# Patient Record
Sex: Male | Born: 1965 | Race: Black or African American | Hispanic: No | Marital: Single | Smoking: Current every day smoker
Health system: Southern US, Community
[De-identification: ages and names within clinical notes are randomized; demographics above are authoritative.]

## PROBLEM LIST (undated history)

## (undated) DIAGNOSIS — Z720 Tobacco use: Secondary | ICD-10-CM

## (undated) DIAGNOSIS — I10 Essential (primary) hypertension: Secondary | ICD-10-CM

## (undated) DIAGNOSIS — A539 Syphilis, unspecified: Secondary | ICD-10-CM

## (undated) DIAGNOSIS — Z87442 Personal history of urinary calculi: Secondary | ICD-10-CM

## (undated) DIAGNOSIS — M1990 Unspecified osteoarthritis, unspecified site: Secondary | ICD-10-CM

## (undated) DIAGNOSIS — I350 Nonrheumatic aortic (valve) stenosis: Secondary | ICD-10-CM

## (undated) DIAGNOSIS — R011 Cardiac murmur, unspecified: Secondary | ICD-10-CM

## (undated) DIAGNOSIS — I639 Cerebral infarction, unspecified: Secondary | ICD-10-CM

## (undated) DIAGNOSIS — F1910 Other psychoactive substance abuse, uncomplicated: Secondary | ICD-10-CM

## (undated) DIAGNOSIS — R7309 Other abnormal glucose: Secondary | ICD-10-CM

## (undated) HISTORY — PX: CHOLECYSTECTOMY: SHX55

## (undated) HISTORY — DX: Syphilis, unspecified: A53.9

---

## 2006-07-14 ENCOUNTER — Emergency Department: Payer: Self-pay | Admitting: Unknown Physician Specialty

## 2007-03-23 ENCOUNTER — Emergency Department: Payer: Self-pay | Admitting: Emergency Medicine

## 2009-05-15 ENCOUNTER — Emergency Department: Payer: Self-pay | Admitting: Emergency Medicine

## 2009-05-21 ENCOUNTER — Inpatient Hospital Stay: Payer: Self-pay | Admitting: Surgery

## 2010-04-01 ENCOUNTER — Emergency Department: Payer: Self-pay | Admitting: Unknown Physician Specialty

## 2016-05-07 ENCOUNTER — Encounter: Payer: Self-pay | Admitting: Emergency Medicine

## 2016-05-07 ENCOUNTER — Emergency Department
Admission: EM | Admit: 2016-05-07 | Discharge: 2016-05-07 | Disposition: A | Discharge status: Home / Self Care | Payer: Self-pay | Attending: Emergency Medicine | Admitting: Emergency Medicine

## 2016-05-07 DIAGNOSIS — F17200 Nicotine dependence, unspecified, uncomplicated: Secondary | ICD-10-CM | POA: Insufficient documentation

## 2016-05-07 DIAGNOSIS — M26622 Arthralgia of left temporomandibular joint: Secondary | ICD-10-CM

## 2016-05-07 DIAGNOSIS — I10 Essential (primary) hypertension: Secondary | ICD-10-CM | POA: Insufficient documentation

## 2016-05-07 DIAGNOSIS — K029 Dental caries, unspecified: Secondary | ICD-10-CM | POA: Insufficient documentation

## 2016-05-07 DIAGNOSIS — K0889 Other specified disorders of teeth and supporting structures: Secondary | ICD-10-CM

## 2016-05-07 DIAGNOSIS — M26602 Left temporomandibular joint disorder, unspecified: Secondary | ICD-10-CM | POA: Insufficient documentation

## 2016-05-07 HISTORY — DX: Essential (primary) hypertension: I10

## 2016-05-07 LAB — POCT RAPID STREP A: Streptococcus, Group A Screen (Direct): NEGATIVE

## 2016-05-07 MED ORDER — TRAMADOL HCL 50 MG PO TABS
100.0000 mg | ORAL_TABLET | Freq: Once | ORAL | Status: AC
  Administered 2016-05-07: 100 mg via ORAL
  Filled 2016-05-07: qty 2

## 2016-05-07 MED ORDER — PENICILLIN V POTASSIUM 500 MG PO TABS
500.0000 mg | ORAL_TABLET | Freq: Four times a day (QID) | ORAL | Status: DC
  Administered 2016-05-07: 500 mg via ORAL
  Filled 2016-05-07: qty 1

## 2016-05-07 MED ORDER — TRAMADOL HCL 50 MG PO TABS: 50.0000 mg | ORAL_TABLET | Freq: Four times a day (QID) | ORAL | 0 refills | Status: DC | PRN

## 2016-05-07 MED ORDER — PENICILLIN V POTASSIUM 500 MG PO TABS: 500.0000 mg | ORAL_TABLET | Freq: Four times a day (QID) | ORAL | 0 refills | Status: DC

## 2016-05-07 NOTE — ED Triage Notes (Signed)
Patient with complaint of sore throat that started on Friday. Patient states that his left ear started hurting today. Patient also reports that he thinks he may have a dental abscess because he had some swelling a couple of days ago.

## 2016-05-07 NOTE — ED Notes (Signed)
Patient c/o sore throat, left lower dental, and left ear pain beginning Monday.

## 2016-05-07 NOTE — Discharge Instructions (Signed)
Please see medications as prescribed. Eat a soft diet. Continue with ibuprofen and tramadol as needed for pain. Follow-up with dental clinic. Return to ER for any worsening symptoms or urgent changes in her health.

## 2016-05-07 NOTE — ED Provider Notes (Signed)
[facility]-EMERGENCY DEPARTMENT Provider Note   CSN: [id number] Arrival date & time: 05/07/16  2141     History   Chief Complaint Chief Complaint  Patient presents with  . Sore Throat  . Otalgia  . Dental Pain    HPI Christopher Spencer is a 51 y.o. male resents to the emergency department for evaluation of dental pain, facial swelling, left ear pain. Patient has had a left-sided toothache, left back lower tooth #19 with left-sided ear pain and TMJ pain. He has pain with chewing. He notices some facial swelling. Symptoms been present for 3 days. No relief with ibuprofen.Marland Kitchen He denies any trauma or injury. No fevers. He is tolerating by mouth well. No nausea or vomiting. No chest pain shortness of breath or abdominal pain.  HPI  Past Medical History:  Diagnosis Date  . Hypertension     There are no active problems to display for this patient.   History reviewed. No pertinent surgical history.     Home Medications    Prior to Admission medications   Medication Sig Start Date End Date Taking? Authorizing Provider  penicillin v potassium (VEETID) 500 MG tablet Take 1 tablet (500 mg total) by mouth 4 (four) times daily. 05/07/16   Evon Slack, PA-C  traMADol (ULTRAM) 50 MG tablet Take 1 tablet (50 mg total) by mouth every 6 (six) hours as needed. 05/07/16   Evon Slack, PA-C    Family History No family history on file.  Social History Social History  Substance Use Topics  . Smoking status: Current Every Day Smoker  . Smokeless tobacco: Never Used  . Alcohol use Not on file     Allergies   Patient has no known allergies.   Review of Systems Review of Systems  Constitutional: Negative.  Negative for activity change, appetite change, chills and fever.  HENT: Positive for dental problem and facial swelling. Negative for congestion, ear pain, mouth sores, rhinorrhea, sinus pressure, sore throat and trouble swallowing.   Eyes: Negative for photophobia, pain and  discharge.  Respiratory: Negative for cough, chest tightness and shortness of breath.   Cardiovascular: Negative for chest pain and leg swelling.  Gastrointestinal: Negative for abdominal distention, abdominal pain, diarrhea, nausea and vomiting.  Genitourinary: Negative for difficulty urinating and dysuria.  Musculoskeletal: Negative for arthralgias, back pain and gait problem.  Skin: Negative for color change and rash.  Neurological: Negative for dizziness and headaches.  Hematological: Negative for adenopathy.  Psychiatric/Behavioral: Negative for agitation and behavioral problems.     Physical Exam Updated Vital Signs BP (!) 154/96   Pulse (!) 105   Temp 98.5 F (36.9 C) (Oral)   Resp 18   Ht  (1.753 m)   Wt 89.4 kg   SpO2 100%   BMI 29.09 kg/m   Physical Exam  Constitutional: He is oriented to person, place, and time. He appears well-developed and well-nourished. No distress.  HENT:  Head: Normocephalic and atraumatic.  Right Ear: External ear normal.  Left Ear: External ear normal.  Nose: Nose normal.  Mouth/Throat: Uvula is midline and oropharynx is clear and moist. No oral lesions. No trismus in the jaw. Normal dentition. Dental caries present. No dental abscesses or uvula swelling.    Patient tender along the TMJ with opening and closing the jaw. Left TM is normal. No mastoid tenderness.  Eyes: EOM are normal. Pupils are equal, round, and reactive to light.  Neck: Normal range of motion. Neck supple.  Cardiovascular: Normal  rate.  Exam reveals no gallop and no friction rub.   No murmur heard. Pulmonary/Chest: Effort normal and breath sounds normal. No respiratory distress.  Neurological: He is alert and oriented to person, place, and time.  Skin: Skin is warm and dry.  Psychiatric: He has a normal mood and affect. His behavior is normal. Thought content normal.     ED Treatments / Results  Labs (all labs ordered are listed, but only abnormal results are  displayed) Labs Reviewed  CULTURE, GROUP A STREP [facility])  POCT RAPID STREP A    EKG  EKG Interpretation None       Radiology No results found.  Procedures Procedures (including critical care time)  Medications Ordered in ED Medications  penicillin v potassium (VEETID) tablet 500 mg (not administered)  traMADol (ULTRAM) tablet 100 mg (not administered)     Initial Impression / Assessment and Plan / ED Course  I have reviewed the triage vital signs and the nursing notes.  Pertinent labs & imaging results that were available during my care of the patient were reviewed by me and considered in my medical decision making (see chart for details).     51 year old male with left-sided dental pain. His mild TMJ syndrome. He will continue with anti-inflammatory medication. Start penicillin VK, tramadol. He is educated on eating a soft diet is given information on follow-up with the dental clinic.  Final Clinical Impressions(s) / ED Diagnoses   Final diagnoses:  Pain, dental  TMJ tenderness, left    New Prescriptions New Prescriptions   PENICILLIN V POTASSIUM (VEETID) 500 MG TABLET    Take 1 tablet (500 mg total) by mouth 4 (four) times daily.   TRAMADOL (ULTRAM) 50 MG TABLET    Take 1 tablet (50 mg total) by mouth every 6 (six) hours as needed.     Evon Slack, PA-C 05/07/16 2314    Minna Antis, MD 05/07/16 [phone]

## 2016-05-08 NOTE — ED Notes (Signed)
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 Pt verbalized understanding of this and repeated it back.

## 2016-05-10 LAB — CULTURE, GROUP A STREP (THRC)

## 2016-08-24 ENCOUNTER — Ambulatory Visit (INDEPENDENT_AMBULATORY_CARE_PROVIDER_SITE_OTHER): Payer: Worker's Compensation

## 2016-08-24 ENCOUNTER — Ambulatory Visit
Admission: EM | Admit: 2016-08-24 | Discharge: 2016-08-24 | Disposition: A | Discharge status: Home / Self Care | Payer: Worker's Compensation | Attending: Family Medicine | Admitting: Family Medicine

## 2016-08-24 ENCOUNTER — Encounter: Payer: Self-pay | Admitting: Emergency Medicine

## 2016-08-24 DIAGNOSIS — S60142A Contusion of left ring finger with damage to nail, initial encounter: Secondary | ICD-10-CM

## 2016-08-24 DIAGNOSIS — W231XXA Caught, crushed, jammed, or pinched between stationary objects, initial encounter: Secondary | ICD-10-CM

## 2016-08-24 DIAGNOSIS — S6992XA Unspecified injury of left wrist, hand and finger(s), initial encounter: Secondary | ICD-10-CM

## 2016-08-24 DIAGNOSIS — S6010XA Contusion of unspecified finger with damage to nail, initial encounter: Secondary | ICD-10-CM | POA: Diagnosis not present

## 2016-08-24 MED ORDER — MELOXICAM 15 MG PO TABS: 15.0000 mg | ORAL_TABLET | Freq: Every day | ORAL | 0 refills | Status: DC

## 2016-08-24 NOTE — ED Provider Notes (Signed)
[facility]    CSN: [id number] Arrival date & time: 08/24/16  1518     History   Chief Complaint Chief Complaint  Patient presents with  . Finger Injury  . Worker's Comp. Injury    HPI Christopher Spencer is a 51 y.o. male.   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 His blood pressures also elevated but he does report he has a history of hypertension and he admits his been noncompliant not been on any medication lately. No other medical problems. He does smoke no known drug allergies no previous surgeries operations no pertinent family medical history other than hypertension relevant to today's visit   The history is provided by the patient. No language interpreter was used.  Hand Pain  This is a new problem. The problem occurs constantly. The problem has been gradually worsening. Pertinent negatives include no chest pain, no abdominal pain, no headaches and no shortness of breath. The symptoms are aggravated by exertion. Nothing relieves the symptoms. The treatment provided no relief.    Past Medical History:  Diagnosis Date  . Hypertension     There are no active problems to display for this patient.   History reviewed. No pertinent surgical history.     Home Medications    Prior to Admission medications   Medication Sig Start Date End Date Taking? Authorizing Provider  alfuzosin (UROXATRAL) 10 MG 24 hr tablet Take 10 mg by mouth daily with breakfast.   Yes [provider]  finasteride (PROSCAR) 5 MG tablet Take 5 mg by mouth daily.   Yes [provider]  hydrochlorothiazide (HYDRODIURIL) 25 MG tablet Take 25 mg by mouth daily.   Yes [provider]  meloxicam (MOBIC) 15 MG tablet Take 1 tablet (15 mg total) by mouth daily. 08/24/16   Hassan Rowan, MD    Family History History  reviewed. No pertinent family history.  Social History Social History  Substance Use Topics  . Smoking status: Current Every Day Smoker    Types: Cigarettes  . Smokeless tobacco: Never Used  . Alcohol use No     Allergies   Patient has no known allergies.   Review of Systems Review of Systems  Respiratory: Negative for shortness of breath.   Cardiovascular: Negative for chest pain.  Gastrointestinal: Negative for abdominal pain.  Neurological: Negative for headaches.  All other systems reviewed and are negative.    Physical Exam Triage Vital Signs ED Triage Vitals  Enc Vitals Group     BP 08/24/16 1632 (S) (!) 210/99     Pulse Rate 08/24/16 1632 65     Resp 08/24/16 1632 16     Temp 08/24/16 1632 98.6 F (37 C)     Temp Source 08/24/16 1632 Oral     SpO2 08/24/16 1632 100 %     Weight 08/24/16 1628 175 lb (79.4 kg)     Height 08/24/16 1628 5\' 9"  (1.753 m)     Head Circumference --      Peak Flow --      Pain Score 08/24/16 1629 8     Pain Loc --      Pain Edu? --      Excl. in GC? --    No data found.   Updated Vital Signs BP (S) (!) 210/99 (BP Location: Right Arm)  Comment: Patient states that he has not taken his BP medicines today  Pulse 65   Temp 98.6 F (37 C) (Oral)   Resp 16   Ht 5\' 9"  (1.753 m)   Wt 175 lb (79.4 kg)   SpO2 100%   BMI 25.84 kg/m   Visual Acuity Right Eye Distance:   Left Eye Distance:   Bilateral Distance:    Right Eye Near:   Left Eye Near:    Bilateral Near:     Physical Exam  Constitutional: He is oriented to person, place, and time. He appears well-developed and well-nourished.  HENT:  Head: Normocephalic and atraumatic.  Right Ear: External ear normal.  Left Ear: External ear normal.  Eyes: Pupils are equal, round, and reactive to light.  Neck: Normal range of motion. Neck supple.  Pulmonary/Chest: Effort normal.  Musculoskeletal: He exhibits edema and tenderness.       Left hand: He exhibits decreased range  of motion and tenderness.       Hands: subungal hematoma was presentover L Ring finger  Neurological: He is alert and oriented to person, place, and time.  Skin: Skin is warm and dry.  Psychiatric: He has a normal mood and affect.  Vitals reviewed.    UC Treatments / Results  Labs (all labs ordered are listed, but only abnormal results are displayed) Labs Reviewed - No data to display  EKG  EKG Interpretation None       Radiology Dg Finger Ring Left  Result Date: 08/24/2016 CLINICAL DATA:  51 y/o M; crushing injury to left ring finger today at work. EXAM: LEFT RING FINGER 2+V COMPARISON:  None. FINDINGS: There is no evidence of fracture or dislocation. There is no evidence of arthropathy or other focal bone abnormality. Soft tissues are unremarkable. IMPRESSION: Negative. Electronically Signed   By: Mitzi Hansen M.D.   On: 08/24/2016 16:49    Procedures .Marland KitchenIncision and Drainage Date/Time: 08/24/2016 5:21 PM Performed by: Hassan Rowan Authorized by: Hassan Rowan   Consent:    Consent obtained:  Verbal   Risks discussed:  Bleeding Location:    Type:  Subungual hematoma   Location:  Upper extremity   Upper extremity location:  Finger   Finger location:  L ring finger Pre-procedure details:    Skin preparation:  Betadine Procedure type:    Complexity:  Simple Procedure details:    Incision types:  Single straight (hole cauterized)   Incision depth:  Subungual   Drainage:  Bloody   Drainage amount:  Copious   Wound treatment:  Wound left open Post-procedure details:    Patient tolerance of procedure:  Tolerated well, no immediate complications Comments:     Patient fourth finger was cleaned with Betadine and then using ice skater 3 holes were placed in the nail with a copious amount of blood issue from it.    (including critical care time)  Medications Ordered in UC Medications - No data to display   Initial Impression / Assessment and Plan / UC  Course  I have reviewed the triage vital signs and the nursing notes.  Pertinent labs & imaging results that were available during my care of the patient were reviewed by me and considered in my medical decision making (see chart for details).    Will place on Mobic 15 mg 1 tablet a day ice the finger and follow-up with Ms. Hassell Halim next week restrict use of the left hand while at work until she sees we'll  place a splint on the fourth finger as well x-ray were negative for fracture   Patient stressed strongly warned he needs follow-up with a PCP very soon for elevated blood pressure and or go to the ED to have blood pressure lowered now the blood pressure he felt was elevated because the pain he was having Final Clinical Impressions(s) / UC Diagnoses   Final diagnoses:  Subungual hematoma of digit of hand, initial encounter  Injury of finger of left hand, initial encounter    New Prescriptions New Prescriptions   MELOXICAM (MOBIC) 15 MG TABLET    Take 1 tablet (15 mg total) by mouth daily.   Note: This dictation was prepared with Dragon dictation along with smaller phrase technology. Any transcriptional errors that result from this process are unintentional. Controlled Substance Prescriptions [place] Controlled Substance Registry consulted? Not Applicable   Hassan RowanWade, Missey Hasley, MD 08/24/16 [phone]

## 2016-08-24 NOTE — ED Triage Notes (Signed)
Patient states that the top of a dryer at his work fell on his left 4th finger today.

## 2017-12-03 ENCOUNTER — Emergency Department: Payer: Self-pay

## 2017-12-03 ENCOUNTER — Observation Stay
Admission: EM | Admit: 2017-12-03 | Discharge: 2017-12-05 | Disposition: A | Discharge status: Home / Self Care | Payer: Self-pay | Attending: Family Medicine | Admitting: Family Medicine

## 2017-12-03 DIAGNOSIS — I639 Cerebral infarction, unspecified: Secondary | ICD-10-CM | POA: Insufficient documentation

## 2017-12-03 DIAGNOSIS — F17210 Nicotine dependence, cigarettes, uncomplicated: Secondary | ICD-10-CM | POA: Insufficient documentation

## 2017-12-03 DIAGNOSIS — F329 Major depressive disorder, single episode, unspecified: Secondary | ICD-10-CM | POA: Insufficient documentation

## 2017-12-03 DIAGNOSIS — F1210 Cannabis abuse, uncomplicated: Secondary | ICD-10-CM | POA: Insufficient documentation

## 2017-12-03 DIAGNOSIS — F1410 Cocaine abuse, uncomplicated: Secondary | ICD-10-CM | POA: Insufficient documentation

## 2017-12-03 DIAGNOSIS — E119 Type 2 diabetes mellitus without complications: Secondary | ICD-10-CM | POA: Insufficient documentation

## 2017-12-03 DIAGNOSIS — R569 Unspecified convulsions: Principal | ICD-10-CM

## 2017-12-03 DIAGNOSIS — I10 Essential (primary) hypertension: Secondary | ICD-10-CM | POA: Insufficient documentation

## 2017-12-03 DIAGNOSIS — I739 Peripheral vascular disease, unspecified: Secondary | ICD-10-CM | POA: Insufficient documentation

## 2017-12-03 DIAGNOSIS — E785 Hyperlipidemia, unspecified: Secondary | ICD-10-CM | POA: Insufficient documentation

## 2017-12-03 MED ORDER — SODIUM CHLORIDE 0.9 % IV BOLUS
1000.0000 mL | Freq: Once | INTRAVENOUS | Status: AC
  Administered 2017-12-04: 1000 mL via INTRAVENOUS

## 2017-12-03 NOTE — ED Notes (Signed)
Patient transported to CT 

## 2017-12-03 NOTE — ED Notes (Signed)
ED Provider at bedside. 

## 2017-12-03 NOTE — ED Provider Notes (Signed)
[facility] Emergency Department Provider Note   ____________________________________________   First MD Initiated Contact with Patient 12/03/17 2342     (approximate)  I have reviewed the triage vital signs and the nursing notes.   HISTORY  Chief Complaint Seizures    HPI Christopher Spencer is a 52 y.o. male brought to the ED from home via EMS with a chief complaint of seizure.  Patient does not have a seizure disorder.  Denies regular alcohol use.  Admits to marijuana use and taking shots of alcohol earlier this evening.  Reportedly family member state patient had seizure-like activity for 5 to 6 minutes.  Patient did not bite tongue but was incontinent of urine.  Reportedly patient vomited once immediately after waking up.  Arrives to the ED alert and oriented.  Complains of mild headache.  Denies vision changes, neck pain, chest pain, shortness of breath, abdominal pain, nausea, vomiting or diarrhea.  Denies recent travel or trauma.   Past Medical History:  Diagnosis Date  . Hypertension     Patient Active Problem List   Diagnosis Date Noted  . Seizure (HCC) 12/04/2017    History reviewed. No pertinent surgical history.  Prior to Admission medications   Medication Sig Start Date End Date Taking? Authorizing Provider  meloxicam (MOBIC) 15 MG tablet Take 1 tablet (15 mg total) by mouth daily. Patient not taking: Reported on 12/04/2017 08/24/16   Hassan RowanWade, [place], MD    Allergies Patient has no known allergies.  No family history on file.  Social History Social History   Tobacco Use  . Smoking status: Current Every Day Smoker    Types: Cigarettes  . Smokeless tobacco: Never Used  Substance Use Topics  . Alcohol use: No  . Drug use: Yes    Types: Marijuana    Review of Systems  Constitutional: No fever/chills Eyes: No visual changes. ENT: No sore throat. Cardiovascular: Denies chest pain. Respiratory: Denies shortness of  breath. Gastrointestinal: No abdominal pain.  No nausea, no vomiting.  No diarrhea.  No constipation. Genitourinary: Negative for dysuria. Musculoskeletal: Negative for back pain. Skin: Negative for rash. Neurological: Positive for syncope versus seizure.  Negative for headaches, focal weakness or numbness.   ____________________________________________   PHYSICAL EXAM:  VITAL SIGNS: ED Triage Vitals  Enc Vitals Group     BP      Pulse      Resp      Temp      Temp src      SpO2      Weight      Height      Head Circumference      Peak Flow      Pain Score      Pain Loc      Pain Edu?      Excl. in GC?     Constitutional: Alert and oriented. Well appearing and in no acute distress. Eyes: Conjunctivae are normal. PERRL. EOMI. Head: Atraumatic. Nose: No congestion/rhinnorhea. Mouth/Throat: Mucous membranes are moist.  Oropharynx non-erythematous.  Did not bite tongue. Neck: No stridor.  No cervical spine tenderness to palpation. Cardiovascular: Normal rate, regular rhythm. Grossly normal heart sounds.  Good peripheral circulation. Respiratory: Normal respiratory effort.  No retractions. Lungs CTAB. Gastrointestinal: Soft and nontender. No distention. No abdominal bruits. No CVA tenderness. Musculoskeletal: No lower extremity tenderness nor edema.  No joint effusions. Neurologic: Alert and oriented x3.  CN II to XII grossly intact.  Normal speech and language.  No gross focal neurologic deficits are appreciated. MAEx4. Skin:  Skin is warm, dry and intact. No rash noted. Psychiatric: Mood and affect are normal. Speech and behavior are normal.  ____________________________________________   LABS (all labs ordered are listed, but only abnormal results are displayed)  Labs Reviewed  COMPREHENSIVE METABOLIC PANEL - Abnormal; Notable for the following components:      Result Value   Potassium 3.3 (*)    Glucose, Bld 158 (*)    All other components within normal limits   URINE DRUG SCREEN, QUALITATIVE ([facility] ONLY) - Abnormal; Notable for the following components:   Cocaine Metabolite,Ur [place] POSITIVE (*)    Cannabinoid 50 Ng, Ur [place] POSITIVE (*)    All other components within normal limits  CBC WITH DIFFERENTIAL/PLATELET  LIPASE, BLOOD  ETHANOL  TROPONIN I   ____________________________________________  EKG  ED ECG REPORT I, Simon Llamas J, the attending physician, personally viewed and interpreted this ECG.   Date: 12/04/2017  EKG Time: 2342  Rate: 78  Rhythm: normal EKG, normal sinus rhythm  Axis: Normal  Intervals:none  ST&T Change: Inverted T waves inferior laterally New T wave changes compared to 05/2009 ____________________________________________  RADIOLOGY  ED MD interpretation: Discussed with radiologist; no ICH, blurring of gray-white matter differentiation concerning for anoxic brain injury versus artifact  Official radiology report(s): Ct Head Wo Contrast  Result Date: 12/04/2017 CLINICAL DATA:  Witnessed seizure.  History of hypertension. EXAM: CT HEAD WITHOUT CONTRAST TECHNIQUE: Contiguous axial images were obtained from the base of the skull through the vertex without intravenous contrast. COMPARISON:  None. FINDINGS: BRAIN: Faint blurring of the gray-white matter differentiation. No intraparenchymal hemorrhage, mass effect nor midline shift. The ventricles and sulci are normal. No acute large vascular territory infarcts. No abnormal extra-axial fluid collections. Basal cisterns are patent. VASCULAR: Unremarkable. SKULL/SOFT TISSUES: No skull fracture. No significant soft tissue swelling. ORBITS/SINUSES: The included ocular globes and orbital contents are normal.Trace paranasal sinus mucosal thickening. Mastoid air cells are well aerated. OTHER: None. IMPRESSION: 1. Subtle findings of hypoxic ischemic injury versus artifact. 2. Acute findings discussed with and reconfirmed by Dr.Christiane Sistare on 12/04/2017 at 12:07 am. Electronically Signed    By: Awilda Metro M.D.   On: 12/04/2017 00:07    ____________________________________________   PROCEDURES  Procedure(s) performed: None  Procedures  Critical Care performed: Yes, see critical care note(s)   CRITICAL CARE Performed by: Irean Hong   Total critical care time: 30 minutes  Critical care time was exclusive of separately billable procedures and treating other patients.  Critical care was necessary to treat or prevent imminent or life-threatening deterioration.  Critical care was time spent personally by me on the following activities: development of treatment plan with patient and/or surrogate as well as nursing, discussions with consultants, evaluation of patient's response to treatment, examination of patient, obtaining history from patient or surrogate, ordering and performing treatments and interventions, ordering and review of laboratory studies, ordering and review of radiographic studies, pulse oximetry and re-evaluation of patient's condition.  ____________________________________________   INITIAL IMPRESSION / ASSESSMENT AND PLAN / ED COURSE  As part of my medical decision making, I reviewed the following data within the electronic MEDICAL RECORD NUMBER History obtained from family, Nursing notes reviewed and incorporated, Labs reviewed, EKG interpreted, Old EKG reviewed, Old chart reviewed, Radiograph reviewed, Discussed with admitting physician and Notes from prior ED visits   52 year old male who presents with first-time seizure.  Differential diagnosis includes but is not limited to ICH, encephalopathy, hypertensive  urgency, drug-induced, metabolic, infectious etiologies, etc.   Clinical Course as of Dec 04 312  Sat Dec 04, 2017  0218 Updated patient and family members of all test results.  Given patient's first-time seizure and abnormal CT head, I discussed with possible services to evaluate patient in the emergency department for admission.  In the  interim I have received patient's tox screen which includes cocaine and cannabinoids.   [JS]    Clinical Course User Index [JS] Irean Hong, MD     ____________________________________________   FINAL CLINICAL IMPRESSION(S) / ED DIAGNOSES  Final diagnoses:  Seizure (HCC)  Cocaine abuse [facility])  Marijuana abuse     ED Discharge Orders    None       Note:  This document was prepared using Dragon voice recognition software and may include unintentional dictation errors.    Irean Hong, MD 12/04/17 [phone]

## 2017-12-03 NOTE — ED Triage Notes (Signed)
Patient coming [facility] from home for witnessed seizure. Per family patient was seizing for approximately 5-6 minutes. Patient was incontinent of urine during seizure. Patient had one large emesis immediately post seizure. Patient has no history of seizures.   Patient AO X 4 at both EMS arrival and arrival to this ED. Patient reports no complaints to this RN, nor did he voice complaints to EMS.   EMS vitals: CBG 102, HR 80 bpm, 98% on RA, BP 130/80.

## 2017-12-04 ENCOUNTER — Observation Stay: Payer: Self-pay

## 2017-12-04 ENCOUNTER — Other Ambulatory Visit: Payer: Self-pay

## 2017-12-04 ENCOUNTER — Encounter: Payer: Self-pay | Admitting: Radiology

## 2017-12-04 ENCOUNTER — Observation Stay (HOSPITAL_BASED_OUTPATIENT_CLINIC_OR_DEPARTMENT_OTHER)
Admit: 2017-12-04 | Discharge: 2017-12-04 | Disposition: A | Discharge status: Home / Self Care | Payer: Self-pay | Attending: Internal Medicine | Admitting: Internal Medicine

## 2017-12-04 DIAGNOSIS — I639 Cerebral infarction, unspecified: Secondary | ICD-10-CM

## 2017-12-04 DIAGNOSIS — R569 Unspecified convulsions: Principal | ICD-10-CM

## 2017-12-04 DIAGNOSIS — I351 Nonrheumatic aortic (valve) insufficiency: Secondary | ICD-10-CM

## 2017-12-04 DIAGNOSIS — I350 Nonrheumatic aortic (valve) stenosis: Secondary | ICD-10-CM

## 2017-12-04 LAB — COMPREHENSIVE METABOLIC PANEL
ALT: 16 U/L (ref 0–44)
AST: 22 U/L (ref 15–41)
Albumin: 3.9 g/dL (ref 3.5–5.0)
Alkaline Phosphatase: 57 U/L (ref 38–126)
Anion gap: 8 (ref 5–15)
BUN: 20 mg/dL (ref 6–20)
CHLORIDE: 106 mmol/L (ref 98–111)
CO2: 28 mmol/L (ref 22–32)
CREATININE: 1.05 mg/dL (ref 0.61–1.24)
Calcium: 9.2 mg/dL (ref 8.9–10.3)
Glucose, Bld: 158 mg/dL — ABNORMAL HIGH (ref 70–99)
Potassium: 3.3 mmol/L — ABNORMAL LOW (ref 3.5–5.1)
Sodium: 142 mmol/L (ref 135–145)
TOTAL PROTEIN: 6.6 g/dL (ref 6.5–8.1)
Total Bilirubin: 0.6 mg/dL (ref 0.3–1.2)

## 2017-12-04 LAB — URINE DRUG SCREEN, QUALITATIVE (ARMC ONLY)
Amphetamines, Ur Screen: NOT DETECTED
BARBITURATES, UR SCREEN: NOT DETECTED
BENZODIAZEPINE, UR SCRN: NOT DETECTED
CANNABINOID 50 NG, UR ~~LOC~~: POSITIVE — AB
Cocaine Metabolite,Ur ~~LOC~~: POSITIVE — AB
MDMA (Ecstasy)Ur Screen: NOT DETECTED
METHADONE SCREEN, URINE: NOT DETECTED
Opiate, Ur Screen: NOT DETECTED
Phencyclidine (PCP) Ur S: NOT DETECTED
TRICYCLIC, UR SCREEN: NOT DETECTED

## 2017-12-04 LAB — CBC WITH DIFFERENTIAL/PLATELET
Abs Immature Granulocytes: 0.02 10*3/uL (ref 0.00–0.07)
BASOS ABS: 0 10*3/uL (ref 0.0–0.1)
Basophils Relative: 1 %
EOS PCT: 1 %
Eosinophils Absolute: 0.1 10*3/uL (ref 0.0–0.5)
HEMATOCRIT: 43.8 % (ref 39.0–52.0)
Hemoglobin: 14.5 g/dL (ref 13.0–17.0)
IMMATURE GRANULOCYTES: 0 %
LYMPHS ABS: 1.3 10*3/uL (ref 0.7–4.0)
LYMPHS PCT: 15 %
MCH: 31 pg (ref 26.0–34.0)
MCHC: 33.1 g/dL (ref 30.0–36.0)
MCV: 93.6 fL (ref 80.0–100.0)
MONOS PCT: 8 %
Monocytes Absolute: 0.7 10*3/uL (ref 0.1–1.0)
NEUTROS PCT: 75 %
NRBC: 0 % (ref 0.0–0.2)
Neutro Abs: 6.4 10*3/uL (ref 1.7–7.7)
Platelets: 161 10*3/uL (ref 150–400)
RBC: 4.68 MIL/uL (ref 4.22–5.81)
RDW: 13.2 % (ref 11.5–15.5)
WBC: 8.5 10*3/uL (ref 4.0–10.5)

## 2017-12-04 LAB — ECHOCARDIOGRAM COMPLETE
Height: 69 in
Weight: 2433.88 oz

## 2017-12-04 LAB — LIPASE, BLOOD: LIPASE: 28 U/L (ref 11–51)

## 2017-12-04 LAB — TROPONIN I: TROPONIN I: 0.03 ng/mL — AB (ref <0.03)

## 2017-12-04 LAB — ETHANOL

## 2017-12-04 LAB — TSH: TSH: 0.552 u[IU]/mL (ref 0.350–4.500)

## 2017-12-04 MED ORDER — IOHEXOL 350 MG/ML SOLN
75.0000 mL | Freq: Once | INTRAVENOUS | Status: AC | PRN
  Administered 2017-12-04: 75 mL via INTRAVENOUS

## 2017-12-04 MED ORDER — ACETAMINOPHEN 650 MG RE SUPP: 650.0000 mg | Freq: Four times a day (QID) | RECTAL | Status: DC | PRN

## 2017-12-04 MED ORDER — DOCUSATE SODIUM 100 MG PO CAPS
100.0000 mg | ORAL_CAPSULE | Freq: Two times a day (BID) | ORAL | Status: DC
  Administered 2017-12-04 – 2017-12-05 (×3): 100 mg via ORAL
  Filled 2017-12-04 (×3): qty 1

## 2017-12-04 MED ORDER — STROKE: EARLY STAGES OF RECOVERY BOOK
Freq: Once | Status: AC
  Administered 2017-12-04: 08:00:00

## 2017-12-04 MED ORDER — ONDANSETRON HCL 4 MG PO TABS: 4.0000 mg | ORAL_TABLET | Freq: Four times a day (QID) | ORAL | Status: DC | PRN

## 2017-12-04 MED ORDER — ACETAMINOPHEN 325 MG PO TABS
650.0000 mg | ORAL_TABLET | Freq: Four times a day (QID) | ORAL | Status: DC | PRN
  Administered 2017-12-04: 650 mg via ORAL
  Filled 2017-12-04: qty 2

## 2017-12-04 MED ORDER — ENOXAPARIN SODIUM 40 MG/0.4ML ~~LOC~~ SOLN
40.0000 mg | SUBCUTANEOUS | Status: DC
  Administered 2017-12-04: 22:00:00 40 mg via SUBCUTANEOUS
  Filled 2017-12-04: qty 0.4

## 2017-12-04 MED ORDER — ASPIRIN 325 MG PO TABS
325.0000 mg | ORAL_TABLET | Freq: Every day | ORAL | Status: DC
  Administered 2017-12-04 – 2017-12-05 (×2): 325 mg via ORAL
  Filled 2017-12-04 (×2): qty 1

## 2017-12-04 MED ORDER — ONDANSETRON HCL 4 MG/2ML IJ SOLN: 4.0000 mg | Freq: Four times a day (QID) | INTRAMUSCULAR | Status: DC | PRN

## 2017-12-04 NOTE — Progress Notes (Signed)
OT Cancellation Note  Patient Details Name: Christopher Spencer MRN: [id number] DOB: [birth year]   Cancelled Treatment:    Reason Eval/Treat Not Completed: OT screened, no needs identified, will sign off. Order received, chart reviewed. Pt back to baseline functional independence - no coordination, sensation, balance, strength, visual, or cognitive deficits noted; no functional deficits noted with ADL/mobility. No skilled OT needs identified. Will sign off. Please re-consult if additional needs arise.   Richrd PrimeJamie Stiller, MPH, MS, OTR/L ascom [phone] 12/04/17, 11:57 AM

## 2017-12-04 NOTE — ED Notes (Signed)
2 unsuccessful attempts at blood specimen collection by this RN. One [facility]. Per patient, 4 attempts at PIV insertion by EMS. Lab called to collect blood specimen.

## 2017-12-04 NOTE — Consult Note (Signed)
Neurology Consult note    HPI: This is a 52 year old man with a past medical history of hypertension, polysubstance abuse who was admitted to [facility] on November 15 after a witnessed seizure.  Per family patient was seizing for approximately 5 to 6 minutes, he was incontinent of urine, bit his tongue, vomited post seizure.  Initially he denied regular alcohol use but admitted to marijuana and taking shots of alcohol earlier that evening, he is also a current smoker.  CT of the head possibly showed hypoxic ischemic injury versus artifact, likely artifact due to patient's nonfocal neurologic status.  MRI of the brain was ordered.  Urine drug screen was positive for cocaine and marijuana.   OBJECTIVE Vitals:   12/04/17 0230 12/04/17 0239 12/04/17 0337 12/04/17 1340  BP:  140/81 136/81 137/83  Pulse: 78 70 79 80  Resp: 18 (!) 21  16  Temp:   98.1 F (36.7 C) 98.5 F (36.9 C)  TempSrc:   Oral Oral  SpO2: 100% 99% 98% 99%  Weight:   69 kg   Height:   5\' 9"  (1.753 m)     CBC:  Recent Labs  Lab 12/04/17 0021  WBC 8.5  NEUTROABS 6.4  HGB 14.5  HCT 43.8  MCV 93.6  PLT 161    Basic Metabolic Panel:  Recent Labs  Lab 12/04/17 0021  NA 142  K 3.3*  CL 106  CO2 28  GLUCOSE 158*  BUN 20  CREATININE 1.05  CALCIUM 9.2    Lipid Panel: No results found for: CHOL, TRIG, HDL, CHOLHDL, VLDL, LDLCALC HgbA1c: No results found for: HGBA1C Urine Drug Screen:     Component Value Date/Time   LABOPIA NONE DETECTED 12/04/2017 0148   COCAINSCRNUR POSITIVE (A) 12/04/2017 0148   LABBENZ NONE DETECTED 12/04/2017 0148   AMPHETMU NONE DETECTED 12/04/2017 0148   THCU POSITIVE (A) 12/04/2017 0148   LABBARB NONE DETECTED 12/04/2017 0148    Alcohol Level     Component Value Date/Time   ETH <10 12/04/2017 0021    IMAGING  Results for orders placed or performed during the hospital encounter of 12/03/17  MR BRAIN WO CONTRAST   Narrative   CLINICAL DATA:  New onset seizure.   Ataxia.  EXAM: MRI HEAD WITHOUT CONTRAST  TECHNIQUE: Multiplanar, multiecho pulse sequences of the brain and surrounding structures were obtained without intravenous contrast.  COMPARISON:  Head CT 12/03/2017  FINDINGS: Brain: There are 2 punctate foci of mild diffusion weighted and T2 FLAIR hyperintensity, one in each cerebellar hemisphere, without clearly reduced ADC though small size limits evaluation. No acute supratentorial infarct is identified. Subcentimeter focus of susceptibility artifact along the inferomedial aspect of the right occipital lobe corresponds to tentorial calcification on CT. No intracranial hemorrhage, mass, midline shift, or extra-axial fluid collection is identified. Patchy T2 hyperintensities in the cerebral white matter bilaterally are nonspecific but compatible with chronic small vessel ischemic disease, moderately advanced for age. The ventricles and sulci are normal. Dedicated seizure protocol temporal lobe imaging was not performed.  Vascular: Major intracranial vascular flow voids are preserved.  Skull and upper cervical spine: Unremarkable bone marrow signal.  Sinuses/Orbits: Unremarkable orbits. Complete opacification of the left maxillary sinus. Mucous retention cysts or polyps in the right maxillary sinus. Mild mucosal thickening in the other sinuses. Trace right mastoid fluid.  Other: None.  IMPRESSION: 1. 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 2. Moderately age advanced chronic small vessel ischemia in the cerebral white matter.   Electronically  Signed   By: Sebastian Ache M.D.   On: 12/04/2017 11:24   CT Head Wo Contrast   Narrative   CLINICAL DATA:  Witnessed seizure.  History of hypertension.  EXAM: CT HEAD WITHOUT CONTRAST  TECHNIQUE: Contiguous axial images were obtained from the base of the skull through the vertex without intravenous contrast.  COMPARISON:  None.  FINDINGS: BRAIN: Faint  blurring of the gray-white matter differentiation. No intraparenchymal hemorrhage, mass effect nor midline shift. The ventricles and sulci are normal. No acute large vascular territory infarcts. No abnormal extra-axial fluid collections. Basal cisterns are patent.  VASCULAR: Unremarkable.  SKULL/SOFT TISSUES: No skull fracture. No significant soft tissue swelling.  ORBITS/SINUSES: The included ocular globes and orbital contents are normal.Trace paranasal sinus mucosal thickening. Mastoid air cells are well aerated.  OTHER: None.  IMPRESSION: 1. Subtle findings of hypoxic ischemic injury versus artifact. 2. Acute findings discussed with and reconfirmed by Dr.JADE SUNG on 12/04/2017 at 12:07 am.   Electronically Signed   By: Awilda Metro M.D.   On: 12/04/2017 00:07        PHYSICAL EXAM Physical exam: Exam: Gen: NAD, conversant and teary                     CV: RRR, no MRG. No Carotid Bruits. No peripheral edema, warm, nontender Eyes: Conjunctivae clear without exudates or hemorrhage  Neuro: Detailed Neurologic Exam  Speech:    Speech is normal; fluent and spontaneous with normal comprehension.  Cognition:    The patient is oriented to person, place, and time;     recent and remote memory intact;     language fluent;     normal attention, concentration,     fund of knowledge Cranial Nerves:    The pupils are equal, round, and reactive to light.  Attempted for endoscopy could not visualize. Visual fields are full to finger confrontation. Extraocular movements are intact. Trigeminal sensation is intact and the muscles of mastication are normal. The face is symmetric. The palate elevates in the midline. Hearing intact. Voice is normal. Shoulder shrug is normal. The tongue has normal motion without fasciculations.   Coordination:    Normal finger to nose  Gait:    Ambulating independently  Motor Observation:    No asymmetry, no atrophy, and no involuntary  movements noted. Tone:    Normal muscle tone.    Posture:    Posture is normal. normal erect    Strength:    Strength is V/V in the upper and lower limbs.      Sensation: intact to LT     Reflex Exam:  DTR's:    Deep tendon reflexes in the upper and lower extremities are symmetrical bilaterally.   Toes:    The toes are downgoing bilaterally.   Clonus:    Clonus is absent.    ASSESSMENT/PLAN [name] is a 52 y.o. male with history of hypertension, polysubstance abuse presenting with seizure in the setting of alcohol, marijuana and cocaine use.  In the process of evaluation MRI showed 2 acute or subacute punctate cerebellar infarcts.  Patient does not have a seizure disorder.  Suspect seizure was provoked by polysubstance abuse.  Patient needs a stroke evaluation at this point.  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  MRA head: Pending  Carotid  Doppler: Pending  2D Echo:  No cardiac source of emboli was indentified. Saline contrast  bubble study not performed. Pending results of CTA head and neck, may need need TEE.  LDL: Pending  HgbA1c: Pending  No anticoagulants prior to admission, now on ASA 325  Patient counseled to be compliant with his antithrombotic medications  Ongoing aggressive stroke risk factor management  Therapy recommendations: Likely none  Disposition: Likely home  Hypertension .  Permissive hypertension (OK if < 220/120) but gradually normalize in 5-7 days .  Long-term BP goal normotensive  Hyperlipidemia  LDL pending, goal < 70  statin at discharge?  Diabetes type II  HgbA1c pending goal < 7.  Other Stroke Risk Factors  Polysubstance abuse including cocaine  Cigarette smoker advised to stop smoking  ETOH use, advised to drink no  more than 2 drink(s) a day  Advanced small vessel disease for age  Personally examined patient and images, and have participated in and made any corrections needed to history, physical, neuro exam,assessment and plan as stated above.  I have personally obtained the history, evaluated lab date, reviewed imaging studies and agree with radiology interpretations.    Naomie DeanAntonia Syan Cullimore, MD  To contact Stroke Continuity provider, please refer to [email or web address]. After hours, contact General Neurology

## 2017-12-04 NOTE — ED Notes (Signed)
Lab at bedside

## 2017-12-04 NOTE — ED Notes (Signed)
Patient admits to marijuana use today. Patient reports drinking 1 shot of liquor.

## 2017-12-04 NOTE — Progress Notes (Addendum)
Pt said this was his first seizure, he grieved about the events leading to his seizure. He expressed fear of uncertainty. He said his condition evoked a deeper reflection on his life.  He said he has a good support system.    12/04/17 1900  Clinical Encounter Type  Visited With Patient  Visit Type Follow-up  Referral From Patient;Nurse  Advance Directives (For Healthcare)  Does Patient Have a Medical Advance Directive? No

## 2017-12-04 NOTE — Progress Notes (Signed)
SLP Cancellation Note  Patient Details Name: Christopher Spencer MRN: [id number] DOB: 07/25/1965   Cancelled treatment:       Reason Eval/Treat Not Completed: SLP screened, no needs identified, will sign off(chart reviewed; consulted NSG then met w/ pt in room). Pt denied any difficulty swallowing and is currently on a regular diet; tolerates swallowing pills w/ water per NSG. Pt conversed at conversational level w/out deficits noted; pt denied any speech-language deficits.  No further skilled ST services indicated as pt appears at his baseline. Pt agreed. NSG to reconsult if any change in status while admitted.    Orinda Kenner, MS, CCC-SLP Leiloni Smithers 12/04/2017, 10:15 AM

## 2017-12-04 NOTE — H&P (Signed)
Christopher Spencer is an 52 y.o. male.   Chief Complaint: Seizure HPI: Patient with past medical history of hypertension presents to the emergency department after witnessed seizure lasting 5 to 6 minutes..  The patient has never had seizures before.  CT of his head showed faint blurring of gray-white matter initiation concerning for possible ischemic injury versus artifact.  The patient complained of mild headache in the emergency department but otherwise was slightly postictal.  He denies pain except for his tongue that was clearly bitten during seizure.  Due to new onset seizure activity and possible stroke the emergency department staff called the hospitalist service for admission.  Past Medical History:  Diagnosis Date  . Hypertension     History reviewed. No pertinent surgical history. None  History reviewed. No pertinent family history. HTN in distant relatives  Social History:  reports that he has been smoking cigarettes. He has been smoking about 0.50 packs per day. He has never used smokeless tobacco. He reports that he has current or past drug history. Drug: Marijuana. He reports that he does not drink alcohol.  Allergies: No Known Allergies  Medications Prior to Admission  Medication Sig Dispense Refill  . meloxicam (MOBIC) 15 MG tablet Take 1 tablet (15 mg total) by mouth daily. (Patient not taking: Reported on 12/04/2017) 30 tablet 0    Results for orders placed or performed during the hospital encounter of 12/03/17 (from the past 48 hour(s))  CBC with Differential     Status: None   Collection Time: 12/04/17 12:21 AM  Result Value Ref Range   WBC 8.5 4.0 - 10.5 K/uL   RBC 4.68 4.22 - 5.81 MIL/uL   Hemoglobin 14.5 13.0 - 17.0 g/dL   HCT 43.8 39.0 - 52.0 %   MCV 93.6 80.0 - 100.0 fL   MCH 31.0 26.0 - 34.0 pg   MCHC 33.1 30.0 - 36.0 g/dL   RDW 13.2 11.5 - 15.5 %   Platelets 161 150 - 400 K/uL   nRBC 0.0 0.0 - 0.2 %   Neutrophils Relative % 75 %   Neutro Abs 6.4 1.7 - 7.7  K/uL   Lymphocytes Relative 15 %   Lymphs Abs 1.3 0.7 - 4.0 K/uL   Monocytes Relative 8 %   Monocytes Absolute 0.7 0.1 - 1.0 K/uL   Eosinophils Relative 1 %   Eosinophils Absolute 0.1 0.0 - 0.5 K/uL   Basophils Relative 1 %   Basophils Absolute 0.0 0.0 - 0.1 K/uL   Immature Granulocytes 0 %   Abs Immature Granulocytes 0.02 0.00 - 0.07 K/uL    Comment: Performed at [facility], [address] [place], [place] [address]  Comprehensive metabolic panel     Status: Abnormal   Collection Time: 12/04/17 12:21 AM  Result Value Ref Range   Sodium 142 135 - 145 mmol/L   Potassium 3.3 (L) 3.5 - 5.1 mmol/L   Chloride 106 98 - 111 mmol/L   CO2 28 22 - 32 mmol/L   Glucose, Bld 158 (H) 70 - 99 mg/dL   BUN 20 6 - 20 mg/dL   Creatinine, Ser 1.05 0.61 - 1.24 mg/dL   Calcium 9.2 8.9 - 10.3 mg/dL   Total Protein 6.6 6.5 - 8.1 g/dL   Albumin 3.9 3.5 - 5.0 g/dL   AST 22 15 - 41 U/L   ALT 16 0 - 44 U/L   Alkaline Phosphatase 57 38 - 126 U/L   Total Bilirubin 0.6 0.3 - 1.2  mg/dL   GFR calc non Af Amer >60 >60 mL/min   GFR calc Af Amer >60 >60 mL/min    Comment: (NOTE) The eGFR has been calculated using the CKD EPI equation. This calculation has not been validated in all clinical situations. eGFR's persistently <60 mL/min signify possible Chronic Kidney Disease.    Anion gap 8 5 - 15    Comment: Performed at [facility], [address] [place], [place] [address]  Lipase, blood     Status: None   Collection Time: 12/04/17 12:21 AM  Result Value Ref Range   Lipase 28 11 - 51 U/L    Comment: Performed at [facility], [address] [place], [place] [address]  Ethanol     Status: None   Collection Time: 12/04/17 12:21 AM  Result Value Ref Range   Alcohol, Ethyl (B) <10 <10 mg/dL    Comment: (NOTE) Lowest detectable limit for serum alcohol is 10 mg/dL. For medical purposes only. Performed at [facility], [address] [place], [place] [address]    Troponin I - Add-On to previous collection     Status: Abnormal   Collection Time: 12/04/17 12:21 AM  Result Value Ref Range   Troponin I 0.03 (HH) <0.03 ng/mL    Comment: CRITICAL RESULT CALLED TO, READ BACK BY AND VERIFIED WITH YAKANA CROSS ON 12/04/17 AT 1017 [facility] Performed at [facility], [address], [place], [place] [address]   Urine Drug Screen, Qualitative     Status: Abnormal   Collection Time: 12/04/17  1:48 AM  Result Value Ref Range   Tricyclic, Ur Screen NONE DETECTED NONE DETECTED   Amphetamines, Ur Screen NONE DETECTED NONE DETECTED   MDMA (Ecstasy)Ur Screen NONE DETECTED NONE DETECTED   Cocaine Metabolite,Ur [place] POSITIVE (A) NONE DETECTED   Opiate, Ur Screen NONE DETECTED NONE DETECTED   Phencyclidine (PCP) Ur S NONE DETECTED NONE DETECTED   Cannabinoid 50 Ng, Ur [place] POSITIVE (A) NONE DETECTED   Barbiturates, Ur Screen NONE DETECTED NONE DETECTED   Benzodiazepine, Ur Scrn NONE DETECTED NONE DETECTED   Methadone Scn, Ur NONE DETECTED NONE DETECTED    Comment: (NOTE) Tricyclics + metabolites, urine    Cutoff 1000 ng/mL Amphetamines + metabolites, urine  Cutoff 1000 ng/mL MDMA (Ecstasy), urine              Cutoff 500 ng/mL Cocaine Metabolite, urine          Cutoff 300 ng/mL Opiate + metabolites, urine        Cutoff 300 ng/mL Phencyclidine (PCP), urine         Cutoff 25 ng/mL Cannabinoid, urine                 Cutoff 50 ng/mL Barbiturates + metabolites, urine  Cutoff 200 ng/mL Benzodiazepine, urine              Cutoff 200 ng/mL Methadone, urine                   Cutoff 300 ng/mL The urine drug screen provides only a preliminary, unconfirmed analytical test result and should not be used for non-medical purposes. Clinical consideration and professional judgment should be applied to any positive drug screen result due to possible interfering substances. A more specific alternate chemical method must be used in order to obtain a confirmed analytical  result. Gas chromatography / mass spectrometry (GC/MS) is the preferred confirmat ory method. Performed at [facility], [place]  [address], [place], [place] [address]   TSH     Status: None   Collection Time: 12/04/17  1:48 AM  Result Value Ref Range   TSH 0.552 0.350 - 4.500 uIU/mL    Comment: Performed by a 3rd Generation assay with a functional sensitivity of <=0.01 uIU/mL. Performed at [facility], [address] [place], [place] [address]    Ct Head Wo Contrast  Result Date: 12/04/2017 CLINICAL DATA:  Witnessed seizure.  History of hypertension. EXAM: CT HEAD WITHOUT CONTRAST TECHNIQUE: Contiguous axial images were obtained from the base of the skull through the vertex without intravenous contrast. COMPARISON:  None. FINDINGS: BRAIN: Faint blurring of the gray-white matter differentiation. No intraparenchymal hemorrhage, mass effect nor midline shift. The ventricles and sulci are normal. No acute large vascular territory infarcts. No abnormal extra-axial fluid collections. Basal cisterns are patent. VASCULAR: Unremarkable. SKULL/SOFT TISSUES: No skull fracture. No significant soft tissue swelling. ORBITS/SINUSES: The included ocular globes and orbital contents are normal.Trace paranasal sinus mucosal thickening. Mastoid air cells are well aerated. OTHER: None. IMPRESSION: 1. Subtle findings of hypoxic ischemic injury versus artifact. 2. Acute findings discussed with and reconfirmed by Dr.JADE SUNG on 12/04/2017 at 12:07 am. Electronically Signed   By: Elon Alas M.D.   On: 12/04/2017 00:07    Review of Systems  Constitutional: Negative for chills and fever.  HENT: Negative for sore throat and tinnitus.   Eyes: Negative for blurred vision and redness.  Respiratory: Negative for cough and shortness of breath.   Cardiovascular: Negative for chest pain, palpitations, orthopnea and PND.  Gastrointestinal: Negative for abdominal pain, diarrhea, nausea and vomiting.   Genitourinary: Negative for dysuria, frequency and urgency.  Musculoskeletal: Negative for joint pain and myalgias.  Skin: Negative for rash.       No lesions  Neurological: Positive for seizures. Negative for speech change, focal weakness and weakness.  Endo/Heme/Allergies: Does not bruise/bleed easily.       No temperature intolerance  Psychiatric/Behavioral: Negative for depression and suicidal ideas.    Blood pressure 136/81, pulse 79, temperature 98.1 F (36.7 C), temperature source Oral, resp. rate (!) 21, height '5\' 9"'  (1.753 m), weight 69 kg, SpO2 98 %. Physical Exam  Vitals reviewed. Constitutional: He is oriented to person, place, and time. He appears well-developed and well-nourished. No distress.  HENT:  Head: Normocephalic and atraumatic.  Mouth/Throat: Oropharynx is clear and moist.  Eyes: Pupils are equal, round, and reactive to light. Conjunctivae and EOM are normal. No scleral icterus.  Neck: Normal range of motion. Neck supple. No JVD present. No tracheal deviation present. No thyromegaly present.  Cardiovascular: Normal rate, regular rhythm and normal heart sounds. Exam reveals no gallop and no friction rub.  No murmur heard. Respiratory: No respiratory distress.  GI: Soft. Bowel sounds are normal. He exhibits no distension. There is no tenderness.  Genitourinary:  Genitourinary Comments: Deferred  Musculoskeletal: Normal range of motion. He exhibits no edema.  Lymphadenopathy:    He has no cervical adenopathy.  Neurological: He is alert and oriented to person, place, and time. No cranial nerve deficit.  Skin: Skin is warm and dry. No rash noted. No erythema.  Psychiatric: He has a normal mood and affect. His behavior is normal. Judgment and thought content normal.     Assessment/Plan This is a 52 year old male admitted for new onset seizures. 1.  Seizures: Seizure precautions; stroke work-up.  No neurological deficits at this time.  Consult neurology. 2.   Hypertension: Controlled; continue  to monitor 3.  Depression: Stable; continue Zoloft 4.  DVT prophylaxis: Lovenox 5.  GI prophylaxis: None The patient is a full code.  Time spent on admission orders and patient care approximately 45 minutes  Harrie Foreman, MD 12/04/2017, 7:56 AM

## 2017-12-04 NOTE — Progress Notes (Signed)
PT Cancellation Note  Patient Details Name: Christopher AlbinoRandy E Gravelle MRN: [id number] DOB: 29-Dec-1965   Cancelled Treatment:    Reason Eval/Treat Not Completed: PT screened, no needs identified, will sign off(Chart reviewed, RN consulted. Pt denies acute abnormality. No gross motor coordiantion or finen motor coorniation deficits identified in screening. Will sign off. )   11:56 AM, 12/04/17 Rosamaria LintsAllan C Levada Bowersox, PT, DPT Physical Therapist - [facility]  [phone] (ASCOM)     Rosaland Shiffman C 12/04/2017, 11:56 AM

## 2017-12-04 NOTE — Progress Notes (Signed)
CRITICAL VALUE ALERT  Critical Value:  0.03 troponin  Date & Time Notied:  12/04/2017 0343  Provider Notified: Joycelyn RuaMichael Diamond, MD  Orders Received/Actions taken: No new orders at this time. Continue to monitor.

## 2017-12-04 NOTE — ED Notes (Signed)
ED Provider at bedside. 

## 2017-12-05 LAB — LIPID PANEL
Cholesterol: 122 mg/dL (ref 0–200)
HDL: 60 mg/dL (ref >40)
LDL Cholesterol: 52 mg/dL (ref 0–99)
Total CHOL/HDL Ratio: 2 RATIO
Triglycerides: 51 mg/dL (ref <150)
VLDL: 10 mg/dL (ref 0–40)

## 2017-12-05 MED ORDER — ASPIRIN EC 81 MG PO TBEC: 81.0000 mg | DELAYED_RELEASE_TABLET | Freq: Every day | ORAL | 0 refills | Status: DC

## 2017-12-05 MED ORDER — ATORVASTATIN CALCIUM 20 MG PO TABS: 80.0000 mg | ORAL_TABLET | Freq: Every day | ORAL | Status: DC

## 2017-12-05 NOTE — Progress Notes (Signed)
Initial Nutrition Assessment  DOCUMENTATION CODES:   Not applicable  INTERVENTION:  Provided [place] to patient. Encouraged him to utilize food resources available in the county so he can have more regular intake of nutritionally-complete meals.  NUTRITION DIAGNOSIS:   Inadequate oral intake related to social / environmental circumstances(food insecurity, homelessness) as evidenced by per patient/family report.  GOAL:   Patient will meet greater than or equal to 90% of their needs  MONITOR:   PO intake, Labs, Weight trends, I & O's  REASON FOR ASSESSMENT:   Malnutrition Screening Tool   ASSESSMENT:   52 year old male with PMHx of HTN, polysubstance abuse who is admitted with seizure in the setting of alcohol, marijuana, and cocaine use, also found to have 2 acute/subacute punctuate cerebellar infarcts.   Met with patient at bedside. He reports his appetite is very good and was also good PTA. He reports he lost his job a few months ago and was unable to afford food. He has been eating infrequently and irregularly and usually only able to afford "junk food." He also reports he has been homeless recently. He is hopefully starting a new job soon and will also be moving in with his aunt. He reports once that happens he will be able to eat regular meals again. He is eating very well here and finishing 100% of meals. He reports he does not need any ONS or snacks. He is amenable to receiving community resource assistance guide that contains information on food resources available in [employer].   Patient reports UBW was 180 lbs (81.8 kg) and that he has lost his weight over the past few months. Large gap in weight trend in chart so unable to trend exactly when patient lost his weight. He was 89.4 kg on 05/07/2016 and 79.4 kg on 08/24/2016.   Meal Completion: 100%  Medications reviewed and include: aspirin 325 mg daily, Colace 100 mg  BID.  Labs reviewed: Potassium 3.3.  Patient does not meet criteria for malnutrition but is at risk for malnutrition if food insecurity persists. Provided patient with Health and safety inspector. He also reports he is moving in with a family member and starting a new job soon.  NUTRITION - FOCUSED PHYSICAL EXAM:    Most Recent Value  Orbital Region  No depletion  Upper Arm Region  No depletion  Thoracic and Lumbar Region  No depletion  Buccal Region  No depletion  Temple Region  Mild depletion  Clavicle Bone Region  Mild depletion  Clavicle and Acromion Bone Region  Mild depletion  Scapular Bone Region  No depletion  Dorsal Hand  No depletion  Patellar Region  No depletion  Anterior Thigh Region  No depletion  Posterior Calf Region  No depletion  Edema (RD Assessment)  None  Hair  Reviewed  Eyes  Reviewed  Mouth  Reviewed  Skin  Reviewed  Nails  Reviewed     Diet Order:   Diet Order            Diet - low sodium heart healthy        Diet Heart Room service appropriate? Yes; Fluid consistency: Thin  Diet effective now             EDUCATION NEEDS:   Education needs have been addressed  Skin:  Skin Assessment: Reviewed RN Assessment  Last BM:  12/04/2017  Height:   Ht Readings from Last 1 Encounters:  12/04/17 _0  (1.753 m)  Weight:   Wt Readings from Last 1 Encounters:  12/05/17 73.6 kg   Ideal Body Weight:  72.7 kg  BMI:  Body mass index is 23.96 kg/m.  Estimated Nutritional Needs:   Kcal:  4734-0370 (MSJ x 1.2-1.4)  Protein:  90-100 grams (1.2-1.4 grams/kg)  Fluid:  1.9-2.2 L/day (1 mL/kcal)  Willey Blade, MS, RD, LDN Office: [phone] Pager: [phone] After Hours/Weekend Pager: [phone]

## 2017-12-05 NOTE — Discharge Summary (Signed)
[facility] [place] at [facility]   PATIENT NAME: Joziyah Roblero    MR#:  [id number]  DATE OF BIRTH:  05-Mar-1965  DATE OF ADMISSION:  12/03/2017 ADMITTING PHYSICIAN: Arnaldo Natal, MD  DATE OF DISCHARGE: No discharge date for patient encounter.  PRIMARY CARE PHYSICIAN: Patient, No Pcp Per    ADMISSION DIAGNOSIS:  Marijuana abuse [F12.10] Seizure (HCC) [R56.9] Cocaine abuse (HCC) [F14.10]  DISCHARGE DIAGNOSIS:  Active Problems:   Seizure (HCC)   SECONDARY DIAGNOSIS:   Past Medical History:  Diagnosis Date  . 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 Cessation counseling given while in house as well as printed materials given to patient  *Acute ischemic cerebrovascular accident  Compounded by poly-illicit drug abuse Admitted on our stroke protocol, imaging noted for 2 punctate cerebellar infarcts, patient treated with aspirin, LDL at goal, carotid Dopplers/echocardiogram/CT angios of the head neck were all normal studies  No focal deficits  *Chronic hypertension  Stable   *Chronic depression  Stable on current regiment    DISCHARGE CONDITIONS:   stable  CONSULTS OBTAINED:    DRUG ALLERGIES:  No Known Allergies  DISCHARGE MEDICATIONS:   Allergies as of 12/05/2017   No Known Allergies     Medication List    TAKE these medications   aspirin EC 81 MG tablet Take 1 tablet (81 mg total) by mouth daily.   meloxicam 15 MG tablet Commonly known as:  MOBIC Take 1 tablet (15 mg total) by mouth daily.        DISCHARGE INSTRUCTIONS:      If you experience worsening of your admission symptoms, develop  shortness of breath, life threatening emergency, suicidal or homicidal thoughts you must seek medical attention immediately by calling 911 or calling your MD immediately  if symptoms less severe.  You Must read complete instructions/literature along with all the possible adverse reactions/side effects for all the Medicines you take and that have been prescribed to you. Take any new Medicines after you have completely understood and accept all the possible adverse reactions/side effects.   Please note  You were cared for by a hospitalist during your hospital stay. If you have any questions about your discharge medications or the care you received while you were in the hospital after you are discharged, you can call the unit and asked to speak with the hospitalist on call if the hospitalist that took care of you is not available. Once you are discharged, your primary care physician will handle any further medical issues. Please note that NO REFILLS for any discharge medications will be authorized once you are discharged, as it is imperative that you return to your primary care physician (or establish a relationship with a primary care physician if you do not have one) for your aftercare needs so that they can reassess your need for medications and monitor your lab values.    Today   CHIEF COMPLAINT:   Chief Complaint  Patient presents with  . Seizures    HISTORY OF PRESENT ILLNESS:   Patient with past medical history of hypertension presents to the emergency department after witnessed seizure lasting 5 to 6 minutes..  The patient has  never had seizures before.  CT of his head showed faint blurring of gray-white matter initiation concerning for possible ischemic injury versus artifact.  The patient complained of mild headache in the emergency department but otherwise was slightly postictal.  He denies pain except for his tongue that was clearly bitten during seizure.  Due to new onset seizure  activity and possible stroke the emergency department staff called the hospitalist service for admission.  VITAL SIGNS:  Blood pressure (!) 177/98, pulse 69, temperature 97.6 F (36.4 C), temperature source Oral, resp. rate 18, height 5\' 9"  (1.753 m), weight 73.6 kg, SpO2 100 %.  I/O:    Intake/Output Summary (Last 24 hours) at 12/05/2017 1034 Last data filed at 12/05/2017 0951 Gross per 24 hour  Intake 840 ml  Output -  Net 840 ml    PHYSICAL EXAMINATION:  GENERAL:  52 y.o.-year-old patient lying in the bed with no acute distress.  EYES: Pupils equal, round, reactive to light and accommodation. No scleral icterus. Extraocular muscles intact.  HEENT: Head atraumatic, normocephalic. Oropharynx and nasopharynx clear.  NECK:  Supple, no jugular venous distention. No thyroid enlargement, no tenderness.  LUNGS: Normal breath sounds bilaterally, no wheezing, rales,rhonchi or crepitation. No use of accessory muscles of respiration.  CARDIOVASCULAR: S1, S2 normal. No murmurs, rubs, or gallops.  ABDOMEN: Soft, non-tender, non-distended. Bowel sounds present. No organomegaly or mass.  EXTREMITIES: No pedal edema, cyanosis, or clubbing.  NEUROLOGIC: Cranial nerves II through XII are intact. Muscle strength 5/5 in all extremities. Sensation intact. Gait not checked.  PSYCHIATRIC: The patient is alert and oriented x 3.  SKIN: No obvious rash, lesion, or ulcer.   DATA REVIEW:   CBC Recent Labs  Lab 12/04/17 0021  WBC 8.5  HGB 14.5  HCT 43.8  PLT 161    Chemistries  Recent Labs  Lab 12/04/17 0021  NA 142  K 3.3*  CL 106  CO2 28  GLUCOSE 158*  BUN 20  CREATININE 1.05  CALCIUM 9.2  AST 22  ALT 16  ALKPHOS 57  BILITOT 0.6    Cardiac Enzymes Recent Labs  Lab 12/04/17 0021  TROPONINI 0.03*    Microbiology Results  Results for orders placed or performed during the hospital encounter of 05/07/16  Culture, group A strep     Status: None   Collection Time: 05/07/16  9:47  PM  Result Value Ref Range Status   Specimen Description THROAT  Final   Special Requests NONE  Final   Culture   Final    NO GROUP A STREP (S.PYOGENES) ISOLATED Performed at [facility], [address], [place], [place] [address]    Report Status 05/10/2016 FINAL  Final    RADIOLOGY:  Ct Angio Head W Or Wo Contrast  Result Date: 12/04/2017 CLINICAL DATA:  Stroke follow-up. Weakness and altered mental status. EXAM: CT ANGIOGRAPHY HEAD AND NECK TECHNIQUE: Multidetector CT imaging of the head and neck was performed using the standard protocol during bolus administration of intravenous contrast. Multiplanar CT image reconstructions and MIPs were obtained to evaluate the vascular anatomy. Carotid stenosis measurements (when applicable) are obtained utilizing NASCET criteria, using the distal internal carotid diameter as the denominator. CONTRAST:  75mL OMNIPAQUE IOHEXOL 350 MG/ML SOLN COMPARISON:  Head CT 12/03/2017 FINDINGS: CTA NECK FINDINGS SKELETON: There is no bony spinal canal stenosis. No lytic or blastic lesion. OTHER NECK: Normal pharynx, larynx and major salivary glands. No cervical lymphadenopathy. Unremarkable thyroid gland. UPPER CHEST: No pneumothorax or pleural  effusion. No nodules or masses. AORTIC ARCH: There is no calcific atherosclerosis of the aortic arch. There is no aneurysm, dissection or hemodynamically significant stenosis of the visualized ascending aorta and aortic arch. Conventional 3 vessel aortic branching pattern. The visualized proximal subclavian arteries are widely patent. RIGHT CAROTID SYSTEM: --Common carotid artery: Widely patent origin without common carotid artery dissection or aneurysm. --Internal carotid artery: No dissection, occlusion or aneurysm. There is mixed density atherosclerosis extending into the proximal ICA, resulting in less than 50% stenosis. --External carotid artery: No acute abnormality. LEFT CAROTID SYSTEM: --Common carotid artery: Widely  patent origin without common carotid artery dissection or aneurysm. --Internal carotid artery: No dissection, occlusion or aneurysm. No stenosis. --External carotid artery: No acute abnormality. VERTEBRAL ARTERIES: Left dominant configuration. Both origins are normal. No dissection, occlusion or flow-limiting stenosis to the vertebrobasilar confluence. CTA HEAD FINDINGS ANTERIOR CIRCULATION: --Intracranial internal carotid arteries: Minimal atherosclerotic calcification. No stenosis. --Anterior cerebral arteries: Normal. Both A1 segments are present. Patent anterior communicating artery. --Middle cerebral arteries: Normal. --Posterior communicating arteries: Present bilaterally. POSTERIOR CIRCULATION: --Basilar artery: Normal. --Posterior cerebral arteries: Normal. --Superior cerebellar arteries: Normal. --Inferior cerebellar arteries: Normal anterior and posterior inferior cerebellar arteries. VENOUS SINUSES: As permitted by contrast timing, patent. ANATOMIC VARIANTS: None DELAYED PHASE: No parenchymal contrast enhancement. Review of the MIP images confirms the above findings. IMPRESSION: 1. No emergent large vessel occlusion or high-grade stenosis. 2. No dissection or hemodynamically significant stenosis of the major cervical arteries. Electronically Signed   By: Deatra Robinson M.D.   On: 12/04/2017 21:52   Ct Head Wo Contrast  Result Date: 12/04/2017 CLINICAL DATA:  Witnessed seizure.  History of hypertension. EXAM: CT HEAD WITHOUT CONTRAST TECHNIQUE: Contiguous axial images were obtained from the base of the skull through the vertex without intravenous contrast. COMPARISON:  None. FINDINGS: BRAIN: Faint blurring of the gray-white matter differentiation. No intraparenchymal hemorrhage, mass effect nor midline shift. The ventricles and sulci are normal. No acute large vascular territory infarcts. No abnormal extra-axial fluid collections. Basal cisterns are patent. VASCULAR: Unremarkable. SKULL/SOFT TISSUES:  No skull fracture. No significant soft tissue swelling. ORBITS/SINUSES: The included ocular globes and orbital contents are normal.Trace paranasal sinus mucosal thickening. Mastoid air cells are well aerated. OTHER: None. IMPRESSION: 1. Subtle findings of hypoxic ischemic injury versus artifact. 2. Acute findings discussed with and reconfirmed by Dr.JADE SUNG on 12/04/2017 at 12:07 am. Electronically Signed   By: Awilda Metro M.D.   On: 12/04/2017 00:07   Ct Angio Neck W Or Wo Contrast  Result Date: 12/04/2017 CLINICAL DATA:  Stroke follow-up. Weakness and altered mental status. EXAM: CT ANGIOGRAPHY HEAD AND NECK TECHNIQUE: Multidetector CT imaging of the head and neck was performed using the standard protocol during bolus administration of intravenous contrast. Multiplanar CT image reconstructions and MIPs were obtained to evaluate the vascular anatomy. Carotid stenosis measurements (when applicable) are obtained utilizing NASCET criteria, using the distal internal carotid diameter as the denominator. CONTRAST:  75mL OMNIPAQUE IOHEXOL 350 MG/ML SOLN COMPARISON:  Head CT 12/03/2017 FINDINGS: CTA NECK FINDINGS SKELETON: There is no bony spinal canal stenosis. No lytic or blastic lesion. OTHER NECK: Normal pharynx, larynx and major salivary glands. No cervical lymphadenopathy. Unremarkable thyroid gland. UPPER CHEST: No pneumothorax or pleural effusion. No nodules or masses. AORTIC ARCH: There is no calcific atherosclerosis of the aortic arch. There is no aneurysm, dissection or hemodynamically significant stenosis of the visualized ascending aorta and aortic arch. Conventional 3 vessel aortic branching pattern. The visualized proximal subclavian arteries  are widely patent. RIGHT CAROTID SYSTEM: --Common carotid artery: Widely patent origin without common carotid artery dissection or aneurysm. --Internal carotid artery: No dissection, occlusion or aneurysm. There is mixed density atherosclerosis extending  into the proximal ICA, resulting in less than 50% stenosis. --External carotid artery: No acute abnormality. LEFT CAROTID SYSTEM: --Common carotid artery: Widely patent origin without common carotid artery dissection or aneurysm. --Internal carotid artery: No dissection, occlusion or aneurysm. No stenosis. --External carotid artery: No acute abnormality. VERTEBRAL ARTERIES: Left dominant configuration. Both origins are normal. No dissection, occlusion or flow-limiting stenosis to the vertebrobasilar confluence. CTA HEAD FINDINGS ANTERIOR CIRCULATION: --Intracranial internal carotid arteries: Minimal atherosclerotic calcification. No stenosis. --Anterior cerebral arteries: Normal. Both A1 segments are present. Patent anterior communicating artery. --Middle cerebral arteries: Normal. --Posterior communicating arteries: Present bilaterally. POSTERIOR CIRCULATION: --Basilar artery: Normal. --Posterior cerebral arteries: Normal. --Superior cerebellar arteries: Normal. --Inferior cerebellar arteries: Normal anterior and posterior inferior cerebellar arteries. VENOUS SINUSES: As permitted by contrast timing, patent. ANATOMIC VARIANTS: None DELAYED PHASE: No parenchymal contrast enhancement. Review of the MIP images confirms the above findings. IMPRESSION: 1. No emergent large vessel occlusion or high-grade stenosis. 2. No dissection or hemodynamically significant stenosis of the major cervical arteries. Electronically Signed   By: Deatra RobinsonKevin  Herman M.D.   On: 12/04/2017 21:52   Mr Brain Wo Contrast  Result Date: 12/04/2017 CLINICAL DATA:  New onset seizure.  Ataxia. EXAM: MRI HEAD WITHOUT CONTRAST TECHNIQUE: Multiplanar, multiecho pulse sequences of the brain and surrounding structures were obtained without intravenous contrast. COMPARISON:  Head CT 12/03/2017 FINDINGS: Brain: There are 2 punctate foci of mild diffusion weighted and T2 FLAIR hyperintensity, one in each cerebellar hemisphere, without clearly reduced ADC  though small size limits evaluation. No acute supratentorial infarct is identified. Subcentimeter focus of susceptibility artifact along the inferomedial aspect of the right occipital lobe corresponds to tentorial calcification on CT. No intracranial hemorrhage, mass, midline shift, or extra-axial fluid collection is identified. Patchy T2 hyperintensities in the cerebral white matter bilaterally are nonspecific but compatible with chronic small vessel ischemic disease, moderately advanced for age. The ventricles and sulci are normal. Dedicated seizure protocol temporal lobe imaging was not performed. Vascular: Major intracranial vascular flow voids are preserved. Skull and upper cervical spine: Unremarkable bone marrow signal. Sinuses/Orbits: Unremarkable orbits. Complete opacification of the left maxillary sinus. Mucous retention cysts or polyps in the right maxillary sinus. Mild mucosal thickening in the other sinuses. Trace right mastoid fluid. Other: None. IMPRESSION: 1. 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 2. Moderately age advanced chronic small vessel ischemia in the cerebral white matter. Electronically Signed   By: Sebastian AcheAllen  Grady M.D.   On: 12/04/2017 11:24   [place] Carotid Bilateral (at [facility] Only)  Result Date: 12/04/2017 CLINICAL DATA:  Stroke. EXAM: BILATERAL CAROTID DUPLEX ULTRASOUND TECHNIQUE: Wallace CullensGray scale imaging, color Doppler and duplex ultrasound were performed of bilateral carotid and vertebral arteries in the neck. COMPARISON:  None. FINDINGS: Criteria: Quantification of carotid stenosis is based on velocity parameters that correlate the residual internal carotid diameter with NASCET-based stenosis levels, using the diameter of the distal internal carotid lumen as the denominator for stenosis measurement. The following velocity measurements were obtained: RIGHT ICA: 100 cm/sec CCA: 104 cm/sec SYSTOLIC ICA/CCA RATIO:  0.9 ECA: 145 cm/sec LEFT ICA: 114 cm/sec CCA: 131  cm/sec SYSTOLIC ICA/CCA RATIO:  0.8 ECA: 136 cm/sec RIGHT CAROTID ARTERY: Echogenic plaque at the right carotid bulb without significant stenosis. Echogenic plaque extending into the proximal external carotid artery.  External carotid artery is patent with normal waveform. Small amount of echogenic plaque at the proximal internal carotid artery. Normal waveforms and velocities in the internal carotid artery. RIGHT VERTEBRAL ARTERY: Antegrade flow and normal waveform in the right vertebral artery. LEFT CAROTID ARTERY: Intimal thickening at the left carotid bulb. External carotid artery is patent with normal waveform. Small amount of heterogeneous plaque in the proximal internal carotid artery. Normal waveforms and velocities in the internal carotid artery. LEFT VERTEBRAL ARTERY: Antegrade flow and normal waveform in the left vertebral artery. IMPRESSION: Mild atherosclerotic disease in the carotid arteries, right side greater than left. Estimated degree of stenosis in the internal carotid arteries is less than 50% bilaterally. Patent vertebral arteries with antegrade flow. Electronically Signed   By: Richarda Overlie M.D.   On: 12/04/2017 08:52    EKG:   Orders placed or performed during the hospital encounter of 12/03/17  . ED EKG  . ED EKG  . EKG 12-Lead  . EKG 12-Lead      Management plans discussed with the patient, family and they are in agreement.  CODE STATUS:     Code Status Orders  (From admission, onward)         Start     Ordered   12/04/17 0340  Full code  Continuous     12/04/17 0340        Code Status History    This patient has a current code status but no historical code status.    Advance Directive Documentation     Most Recent Value  Type of Advance Directive  Healthcare Power of Attorney, Living will  Pre-existing out of facility DNR order (yellow form or pink MOST form)  -  "MOST" Form in Place?  -      TOTAL TIME TAKING CARE OF THIS PATIENT: 40 minutes.     Evelena Asa Alaija Ruble M.D on 12/05/2017 at 10:34 AM  Between 7am to 6pm - Pager - [phone]  After 6pm go to [email or web address] - password [email or web address]  [facility]  Office  [phone]  CC: Primary care physician; Patient, No Pcp Per   Note: This dictation was prepared with Dragon dictation along with smaller phrase technology. Any transcriptional errors that result from this process are unintentional.

## 2017-12-06 LAB — HEMOGLOBIN A1C
Hgb A1c MFr Bld: 6.1 % — ABNORMAL HIGH (ref 4.8–5.6)
Mean Plasma Glucose: 128 mg/dL

## 2018-02-17 ENCOUNTER — Encounter: Payer: Self-pay | Admitting: Radiology

## 2018-02-17 ENCOUNTER — Other Ambulatory Visit: Payer: Self-pay

## 2018-02-17 ENCOUNTER — Emergency Department: Payer: Self-pay

## 2018-02-17 ENCOUNTER — Emergency Department
Admission: EM | Admit: 2018-02-17 | Discharge: 2018-02-17 | Disposition: A | Discharge status: Home / Self Care | Payer: Self-pay | Attending: Emergency Medicine | Admitting: Emergency Medicine

## 2018-02-17 DIAGNOSIS — I10 Essential (primary) hypertension: Secondary | ICD-10-CM | POA: Insufficient documentation

## 2018-02-17 DIAGNOSIS — R600 Localized edema: Secondary | ICD-10-CM | POA: Insufficient documentation

## 2018-02-17 DIAGNOSIS — Z7982 Long term (current) use of aspirin: Secondary | ICD-10-CM | POA: Insufficient documentation

## 2018-02-17 DIAGNOSIS — R0602 Shortness of breath: Secondary | ICD-10-CM | POA: Insufficient documentation

## 2018-02-17 DIAGNOSIS — F17210 Nicotine dependence, cigarettes, uncomplicated: Secondary | ICD-10-CM | POA: Insufficient documentation

## 2018-02-17 LAB — PROTIME-INR
INR: 0.87
Prothrombin Time: 11.8 seconds (ref 11.4–15.2)

## 2018-02-17 LAB — CBC WITH DIFFERENTIAL/PLATELET
ABS IMMATURE GRANULOCYTES: 0.01 10*3/uL (ref 0.00–0.07)
Basophils Absolute: 0.1 10*3/uL (ref 0.0–0.1)
Basophils Relative: 1 %
Eosinophils Absolute: 0.2 10*3/uL (ref 0.0–0.5)
Eosinophils Relative: 3 %
HCT: 44.8 % (ref 39.0–52.0)
Hemoglobin: 14.7 g/dL (ref 13.0–17.0)
IMMATURE GRANULOCYTES: 0 %
LYMPHS PCT: 38 %
Lymphs Abs: 2.1 10*3/uL (ref 0.7–4.0)
MCH: 30.9 pg (ref 26.0–34.0)
MCHC: 32.8 g/dL (ref 30.0–36.0)
MCV: 94.3 fL (ref 80.0–100.0)
Monocytes Absolute: 0.5 10*3/uL (ref 0.1–1.0)
Monocytes Relative: 10 %
NEUTROS ABS: 2.6 10*3/uL (ref 1.7–7.7)
Neutrophils Relative %: 48 %
Platelets: 154 10*3/uL (ref 150–400)
RBC: 4.75 MIL/uL (ref 4.22–5.81)
RDW: 13.1 % (ref 11.5–15.5)
WBC: 5.5 10*3/uL (ref 4.0–10.5)
nRBC: 0 % (ref 0.0–0.2)

## 2018-02-17 LAB — COMPREHENSIVE METABOLIC PANEL
ALK PHOS: 63 U/L (ref 38–126)
ALT: 15 U/L (ref 0–44)
AST: 14 U/L — ABNORMAL LOW (ref 15–41)
Albumin: 4.2 g/dL (ref 3.5–5.0)
Anion gap: 4 — ABNORMAL LOW (ref 5–15)
BUN: 15 mg/dL (ref 6–20)
CO2: 32 mmol/L (ref 22–32)
Calcium: 9.2 mg/dL (ref 8.9–10.3)
Chloride: 104 mmol/L (ref 98–111)
Creatinine, Ser: 0.92 mg/dL (ref 0.61–1.24)
GFR calc Af Amer: >60 mL/min (ref >60)
GFR calc non Af Amer: >60 mL/min (ref >60)
GLUCOSE: 104 mg/dL — AB (ref 70–99)
Potassium: 4.2 mmol/L (ref 3.5–5.1)
Sodium: 140 mmol/L (ref 135–145)
Total Bilirubin: 1 mg/dL (ref 0.3–1.2)
Total Protein: 6.8 g/dL (ref 6.5–8.1)

## 2018-02-17 LAB — TROPONIN I: Troponin I: <0.03 ng/mL (ref <0.03)

## 2018-02-17 LAB — LACTIC ACID, PLASMA: Lactic Acid, Venous: 1.2 mmol/L (ref 0.5–1.9)

## 2018-02-17 LAB — BRAIN NATRIURETIC PEPTIDE: B Natriuretic Peptide: 49 pg/mL (ref 0.0–100.0)

## 2018-02-17 MED ORDER — IOHEXOL 350 MG/ML SOLN
75.0000 mL | Freq: Once | INTRAVENOUS | Status: AC | PRN
  Administered 2018-02-17: 75 mL via INTRAVENOUS
  Filled 2018-02-17: qty 75

## 2018-02-17 MED ORDER — MELOXICAM 15 MG PO TABS: 15.0000 mg | ORAL_TABLET | Freq: Every day | ORAL | 0 refills | Status: DC

## 2018-02-17 NOTE — ED Provider Notes (Addendum)
[facility] Emergency Department Provider Note  ____________________________________________  Time seen: Approximately 3:48 PM  I have reviewed the triage vital signs and the nursing notes.   HISTORY  Chief Complaint Foot Pain    HPI Christopher AlbinoRandy E Spencer is a 53 y.o. male who presents emergency department complaining of multiple complaints.  Patient has developed significant left lower extremity edema with pain in the left calf and left foot region.  Patient reports that this has been increasing over the past 4 to 5 days but most significantly over the last 2.  Patient reports that the area feels warm but he denies any erythema.  He denies any trauma to the foot or ankle.  Patient denies any radicular symptoms down the left lower extremity.  Patient is also endorsing pain in the left shoulder radiating into the left upper arm.  Patient denies any edema to the area.  He denies any chest pain or palpitations.  Of note, patient had an occlusive stroke 2 months ago.  Patient reports that he was referred to neurology for follow-up but had not been placed on anticoagulation therapy after CVA.  Patient endorses hypertension but no other chronic medical complaints.  Of note, patient was prescribed aspirin, 81 mg tablet daily roughly 2 months prior.  Patient denies any daily medications including aspirin.   On review of medical chart, patient was noted to be admitted to the hospital 2-1/2 months ago.  Patient was admitted for seizure following cocaine abuse.  While admitted, 2 punctate infarcts were noted on MRI.  Patient had been referred to neurology for follow-up and repeat evaluation.  Discharge summary at the time noted that patient be started on aspirin therapy pending neurology consult.  Patient states that he was unable to afford his neurology appointment.   Past Medical History:  Diagnosis Date  . Hypertension     Patient Active Problem List   Diagnosis Date Noted  .  Seizure (HCC) 12/04/2017    No past surgical history on file.  Prior to Admission medications   Medication Sig Start Date End Date Taking? Authorizing Provider  aspirin EC 81 MG tablet Take 1 tablet (81 mg total) by mouth daily. 12/05/17   Salary, Evelena AsaMontell D, MD  meloxicam (MOBIC) 15 MG tablet Take 1 tablet (15 mg total) by mouth daily. 02/17/18   Jairus Tonne, Delorise RoyalsJonathan D, PA-C    Allergies Patient has no known allergies.  No family history on file.  Social History Social History   Tobacco Use  . Smoking status: Current Every Day Smoker    Packs/day: 0.50    Types: Cigarettes  . Smokeless tobacco: Never Used  Substance Use Topics  . Alcohol use: No  . Drug use: Yes    Types: Marijuana     Review of Systems  Constitutional: No fever/chills Eyes: No visual changes.  ENT: No upper respiratory complaints. Cardiovascular: no chest pain. Respiratory: no cough. No SOB. Gastrointestinal: No abdominal pain.  No nausea, no vomiting.  No diarrhea.  No constipation. Genitourinary: Negative for dysuria. No hematuria Musculoskeletal: Positive for gross edema to the left lower extremity.  Patient denies erythema.  Pain associated with edema.  Patient is also having pain rating from the shoulder into the left upper extremity. Skin: Negative for rash, abrasions, lacerations, ecchymosis. Neurological: Negative for headaches, focal weakness or numbness. 10-point ROS otherwise negative.  ____________________________________________   PHYSICAL EXAM:  VITAL SIGNS: ED Triage Vitals  Enc Vitals Group     BP 02/17/18 1507 Marland Kitchen(!)  175/104     Pulse Rate 02/17/18 1507 67     Resp 02/17/18 1507 20     Temp 02/17/18 1507 98 F (36.7 C)     Temp src --      SpO2 02/17/18 1507 100 %     Weight 02/17/18 1512 170 lb (77.1 kg)     Height 02/17/18 1512 5\' 9"  (1.753 m)     Head Circumference --      Peak Flow --      Pain Score 02/17/18 1511 5     Pain Loc --      Pain Edu? --      Excl. in GC?  --      Constitutional: Alert and oriented. Well appearing and in no acute distress. Eyes: Conjunctivae are normal. PERRL. EOMI. Head: Atraumatic. Neck: No stridor.    Cardiovascular: Normal rate, regular rhythm. Normal S1 and S2.  No murmurs, rubs, gallops.  No palpable heave.  No muffled heart tones.Peri Jefferson peripheral circulation. Respiratory: Normal respiratory effort without tachypnea or retractions. Lungs CTAB. Good air entry to the bases with no decreased or absent breath sounds. Musculoskeletal: Full range of motion to all extremities. No gross deformities appreciated.  Visualization of the left lower extremity reveals gross edema when compared with unaffected extremity.  No gross erythema identified.  On palpation, area is only minimally warm compared to unaffected extremity.  No pitting edema.  Dorsalis pedis pulses appreciated distally.  Sensation intact all 5 digits distally.  No obvious signs of wound.  No obvious signs of trauma. Neurologic:  Normal speech and language. No gross focal neurologic deficits are appreciated.  Skin:  Skin is warm, dry and intact. No rash noted. Psychiatric: Mood and affect are normal. Speech and behavior are normal. Patient exhibits appropriate insight and judgement.   ____________________________________________   LABS (all labs ordered are listed, but only abnormal results are displayed)  Labs Reviewed  COMPREHENSIVE METABOLIC PANEL - Abnormal; Notable for the following components:      Result Value   Glucose, Bld 104 (*)    AST 14 (*)    Anion gap 4 (*)    All other components within normal limits  BRAIN NATRIURETIC PEPTIDE  TROPONIN I  LACTIC ACID, PLASMA  CBC WITH DIFFERENTIAL/PLATELET  PROTIME-INR   ____________________________________________  EKG  ED ECG REPORT I, Delorise Royals Rashena Dowling,  personally viewed and interpreted this ECG.   Date: 1605 hrs.  EKG Time: 02/17/2018  Rate: 62 bpm  Rhythm: unchanged from previous  tracings, normal sinus rhythm, T wave changes in inferior and lateral leads.  Axis: Normal axis  Intervals:none  ST&T Change: No ST elevation or depression noted  Normal sinus rhythm.  Nonspecific T wave changes which are consistent with previous EKG.  No STEMI.  ____________________________________________  RADIOLOGY I personally viewed and evaluated these images as part of my medical decision making, as well as reviewing the written report by the radiologist.  Ct Angio Chest Pe W And/or Wo Contrast  Result Date: 02/17/2018 CLINICAL DATA:  53 year old male with shortness of breath and LOWER extremity edema for 2 days. EXAM: CT ANGIOGRAPHY CHEST WITH CONTRAST TECHNIQUE: Multidetector CT imaging of the chest was performed using the standard protocol during bolus administration of intravenous contrast. Multiplanar CT image reconstructions and MIPs were obtained to evaluate the vascular anatomy. CONTRAST:  40mL OMNIPAQUE IOHEXOL 350 MG/ML SOLN COMPARISON:  None. FINDINGS: Cardiovascular: This is a technically satisfactory study. No pulmonary emboli are identified. Mild  cardiomegaly noted. Coronary artery and thoracic aortic atherosclerotic calcifications are present. Aortic valvular calcifications are also identified. No thoracic aortic aneurysm or pericardial effusion. Mediastinum/Nodes: No enlarged mediastinal, hilar, or axillary lymph nodes. Thyroid gland, trachea, and esophagus demonstrate no significant findings. Lungs/Pleura: No airspace disease, consolidation, mass, pleural effusion or pneumothorax. Subsegmental atelectasis within the LEFT LOWER lobe noted. Upper Abdomen: No acute abnormality Musculoskeletal: No acute or suspicious bony abnormalities. Review of the MIP images confirms the above findings. IMPRESSION: 1. No evidence of pulmonary emboli. 2. Subsegmental LEFT LOWER lobe atelectasis. 3. Cardiomegaly and aortic valvular calcifications which may indicate aortic stenosis. Correlate  clinically. No evidence of thoracic aortic aneurysm. 4. Coronary artery and Aortic Atherosclerosis (ICD10-I70.0). Electronically Signed   By: Harmon Pier M.D.   On: 02/17/2018 18:13   US Venous Img Lower Unilateral Left  Result Date: 02/17/2018 CLINICAL DATA:  Left lower extremity pain and swelling EXAM: LEFT LOWER EXTREMITY VENOUS DOPPLER ULTRASOUND TECHNIQUE: Gray-scale sonography with graded compression, as well as color Doppler and duplex ultrasound were performed to evaluate the lower extremity deep venous systems from the level of the common femoral vein and including the common femoral, femoral, profunda femoral, popliteal and calf veins including the posterior tibial, peroneal and gastrocnemius veins when visible. The superficial great saphenous vein was also interrogated. Spectral Doppler was utilized to evaluate flow at rest and with distal augmentation maneuvers in the common femoral, femoral and popliteal veins. COMPARISON:  None. FINDINGS: Contralateral Common Femoral Vein: Respiratory phasicity is normal and symmetric with the symptomatic side. No evidence of thrombus. Normal compressibility. Common Femoral Vein: No evidence of thrombus. Normal compressibility, respiratory phasicity and response to augmentation. Saphenofemoral Junction: No evidence of thrombus. Normal compressibility and flow on color Doppler imaging. Profunda Femoral Vein: No evidence of thrombus. Normal compressibility and flow on color Doppler imaging. Femoral Vein: No evidence of thrombus. Normal compressibility, respiratory phasicity and response to augmentation. Popliteal Vein: No evidence of thrombus. Normal compressibility, respiratory phasicity and response to augmentation. Calf Veins: No evidence of thrombus. Normal compressibility and flow on color Doppler imaging. Superficial Great Saphenous Vein: No evidence of thrombus. Normal compressibility. Venous Reflux:  None. Other Findings:  None. IMPRESSION: No evidence of  deep venous thrombosis. Electronically Signed   By: Alcide Clever M.D.   On: 02/17/2018 17:26   US Venous Img Upper Uni Left  Result Date: 02/17/2018 CLINICAL DATA:  Left upper extremity swelling EXAM: LEFT UPPER EXTREMITY VENOUS DOPPLER ULTRASOUND TECHNIQUE: Gray-scale sonography with graded compression, as well as color Doppler and duplex ultrasound were performed to evaluate the upper extremity deep venous system from the level of the subclavian vein and including the jugular, axillary, basilic, radial, ulnar and upper cephalic vein. Spectral Doppler was utilized to evaluate flow at rest and with distal augmentation maneuvers. COMPARISON:  None. FINDINGS: Contralateral Subclavian Vein: Respiratory phasicity is normal and symmetric with the symptomatic side. No evidence of thrombus. Normal compressibility. Internal Jugular Vein: No evidence of thrombus. Normal compressibility, respiratory phasicity and response to augmentation. Subclavian Vein: No evidence of thrombus. Normal compressibility, respiratory phasicity and response to augmentation. Axillary Vein: No evidence of thrombus. Normal compressibility, respiratory phasicity and response to augmentation. Cephalic Vein: No evidence of thrombus. Normal compressibility, respiratory phasicity and response to augmentation. Basilic Vein: No evidence of thrombus. Normal compressibility, respiratory phasicity and response to augmentation. Brachial Veins: No evidence of thrombus. Normal compressibility, respiratory phasicity and response to augmentation. Radial Veins: No evidence of thrombus. Normal compressibility, respiratory phasicity and response  to augmentation. Ulnar Veins: No evidence of thrombus. Normal compressibility, respiratory phasicity and response to augmentation. Venous Reflux:  None visualized. Other Findings:  None visualized. IMPRESSION: No evidence of DVT within the left upper extremity. Electronically Signed   By: Alcide CleverMark  Lukens M.D.   On:  02/17/2018 17:25   Dg Foot Complete Left  Result Date: 02/17/2018 CLINICAL DATA:  Foot pain EXAM: LEFT FOOT - COMPLETE 3+ VIEW COMPARISON:  None. FINDINGS: There is no evidence of fracture or dislocation. There is no evidence of arthropathy or other focal bone abnormality. Soft tissues are unremarkable. IMPRESSION: Negative. Electronically Signed   By: Deatra RobinsonKevin  Herman M.D.   On: 02/17/2018 16:08    ____________________________________________    PROCEDURES  Procedure(s) performed:    Procedures    Medications  iohexol (OMNIPAQUE) 350 MG/ML injection 75 mL (75 mLs Intravenous Contrast Given 02/17/18 1739)       Pulmonary Embolism Rule-out Criteria (PERC rule)                        If YES to ANY of the following, the PERC rule is not satisfied and cannot be used to rule out PE in this patient (consider d-dimer or imaging depending on pre-test probability).                      If NO to ALL of the following, AND the clinician's pre-test probability is <15%, the [facility] rule is satisfied and there is no need for further workup (including no need to obtain a d-dimer) as the post-test probability of pulmonary embolism is <2%.                      Mnemonic is HAD CLOTS   H - hormone use (exogenous estrogen)      No. A - age > 50                                                 Yes.   D - DVT/PE history                                      No.   C - coughing blood (hemoptysis)                 No. L - leg swelling, unilateral                             Yes.   O - O2 Sat on Room Air < 95%                  No. T - tachycardia (HR ? 100)                         No. S - surgery or trauma, recent                      No.   Based on my evaluation of the patient, including application of this decision instrument, further testing to evaluate for pulmonary embolism is indicated at this time. I have discussed this recommendation with the patient who  states understanding and agreement with this  plan.   ____________________________________________   INITIAL IMPRESSION / ASSESSMENT AND PLAN / ED COURSE  Pertinent labs & imaging results that were available during my care of the patient were reviewed by me and considered in my medical decision making (see chart for details).  Review of the [place] CSRS was performed in accordance of the [facility] prior to dispensing any controlled drugs.  Clinical Course as of Feb 17 1830  Thu Feb 17, 2018  1604 Patient presents emergency department with primary complaint of left lower extremity edema, pain.  Patient is also endorsing pain radiating from the left shoulder into the left upper arm.  On exam, patient has a grossly edematous left lower extremity.  No gross erythema.  No signs of trauma or gross signs of infection.  Patient did have a CVA that was likely occlusive 2-1/2 months ago.  Patient was to be on aspirin therapy until he saw neurology.  Patient did not follow-up with neurology due to cost.  He is not taking aspirin on a daily basis.  At this time, greatest concern is for DVT of both the left lower extremity, left upper extremity.  Given patient's history, physical exam findings, patient will also be evaluated with labs, PE chest.  EKG and troponin will also be ordered as well as a BNP.  Differential includes DVT, CHF, vascular occlusion, PE, ACS, cellulitis.   [JC]  [id number] Patient presents emergency department complaining of left lower extremity edema, left upper extremity pain.  On presentation, I was most concerned about DVT.  I did evaluate for evidence of trauma, infection, heart or renal etiology of edema.  Overall, labs and emerging were reassuring.  No gross signs of infection on exam or lab work.  No evidence of DVT on ultrasound.  Negative PE chest.  BNP was within normal limits.  At this time, no clear indication of cause of edema.  I will place the patient on anti-inflammatory to see if this improves symptoms.  At this time, no indication for  diuretic use.  No indication at this time for additional work-up.  Patient is given strict return precautions to return for any new or worsening symptoms.   [JC]    Clinical Course User Index [JC] Dakisha Schoof, Delorise Royals, PA-C     Patient's diagnosis is consistent with left lower extremity edema.  Patient presents emergency department with complaint of left lower extremity swelling times several days.  On exam, patient did have edema of the left lower extremity when compared with right.  No recent trauma.  No indication of infectious origin on labs or exam.  Differential included cellulitis, trauma, DVT, CHF, renal failure, arterial occlusion.  Remainder of work-up to include labs and imaging were reassuring with no indication of CHF, infection, DVT, PE.  As such, patient was placed on anti-inflammatories for symptom improvement.  Patient is given strict precautions to return for any new or worsening symptoms.  Follow-up with primary care..  Patient is given ED precautions to return to the ED for any worsening or new symptoms.     ____________________________________________  FINAL CLINICAL IMPRESSION(S) / ED DIAGNOSES  Final diagnoses:  Edema of left lower extremity      NEW MEDICATIONS STARTED DURING THIS VISIT:  ED Discharge Orders         Ordered    meloxicam (MOBIC) 15 MG tablet  Daily     02/17/18 1831  This chart was dictated using voice recognition software/Dragon. Despite best efforts to proofread, errors can occur which can change the meaning. Any change was purely unintentional.    Racheal Patches, PA-C 02/17/18 1831    Racheal Patches, PA-C 02/17/18 1832    Myrna Blazer, MD 02/17/18 [phone]

## 2018-02-17 NOTE — ED Triage Notes (Signed)
Pt stating swelling to his left foot and lower leg. Pt stating he noticed it two days ago. Skin is warm to touch and no redness noted. Pt stating pain around his toes. Pain is worse with walking.

## 2018-02-17 NOTE — ED Provider Notes (Signed)
-----------------------------------------   4:07 PM on 02/17/2018 -----------------------------------------  EKG viewed and interpreted by myself shows a normal sinus rhythm at 62 bpm with a narrow QRS, normal axis, largely normal intervals.  Patient does have inferolateral T wave inversions however unchanged compared to 12/03/2017.  No ST elevation noted.   Minna Antis, MD 02/17/18 [phone]

## 2019-09-20 ENCOUNTER — Encounter: Payer: Self-pay | Admitting: Emergency Medicine

## 2019-09-20 ENCOUNTER — Emergency Department
Admission: EM | Admit: 2019-09-20 | Discharge: 2019-09-20 | Disposition: A | Discharge status: Home / Self Care | Payer: Self-pay | Attending: Emergency Medicine | Admitting: Emergency Medicine

## 2019-09-20 ENCOUNTER — Other Ambulatory Visit: Payer: Self-pay

## 2019-09-20 ENCOUNTER — Emergency Department: Payer: Self-pay

## 2019-09-20 DIAGNOSIS — I10 Essential (primary) hypertension: Secondary | ICD-10-CM | POA: Insufficient documentation

## 2019-09-20 DIAGNOSIS — Z7982 Long term (current) use of aspirin: Secondary | ICD-10-CM | POA: Insufficient documentation

## 2019-09-20 DIAGNOSIS — M25561 Pain in right knee: Secondary | ICD-10-CM | POA: Insufficient documentation

## 2019-09-20 DIAGNOSIS — F17210 Nicotine dependence, cigarettes, uncomplicated: Secondary | ICD-10-CM | POA: Insufficient documentation

## 2019-09-20 MED ORDER — KETOROLAC TROMETHAMINE 30 MG/ML IJ SOLN
30.0000 mg | Freq: Once | INTRAMUSCULAR | Status: AC
  Administered 2019-09-20: 30 mg via INTRAMUSCULAR
  Filled 2019-09-20: qty 1

## 2019-09-20 MED ORDER — MELOXICAM 15 MG PO TABS: 15.0000 mg | ORAL_TABLET | Freq: Every day | ORAL | 1 refills | Status: AC

## 2019-09-20 NOTE — ED Notes (Signed)
Pt has pain in right knee   No known injury  Pain in knee for 3 months.  Pt took no meds for pain.  Pt alert.  No swelling or deformity noted.

## 2019-09-20 NOTE — ED Triage Notes (Signed)
Patient ambulatory to triage with steady gait, without difficulty or distress noted; pt reports rt knee pain x 4-20months with no injury

## 2019-09-20 NOTE — ED Provider Notes (Signed)
Emergency Department Provider Note  ____________________________________________  Time seen: Approximately 10:42 PM  I have reviewed the triage vital signs and the nursing notes.   HISTORY  Chief Complaint Knee Pain   Historian Patient    HPI Christopher Spencer is a 54 y.o. male presents to the emergency department with right knee pain for the past 4 to 5 months.  Patient denies falls or mechanisms of trauma.  He localizes pain to the patella and along the lateral aspect of the right knee.  No numbness or tingling of the right lower extremity.  Patient states that he has not attempted any alleviating measures at home.   Past Medical History:  Diagnosis Date  . Hypertension      Immunizations up to date:  Yes.     Past Medical History:  Diagnosis Date  . Hypertension     Patient Active Problem List   Diagnosis Date Noted  . Seizure (HCC) 12/04/2017    Past Surgical History:  Procedure Laterality Date  . CHOLECYSTECTOMY      Prior to Admission medications   Medication Sig Start Date End Date Taking? Authorizing Provider  aspirin EC 81 MG tablet Take 1 tablet (81 mg total) by mouth daily. 12/05/17   Salary, Evelena Asa, MD  meloxicam (MOBIC) 15 MG tablet Take 1 tablet (15 mg total) by mouth daily for 7 days. 09/20/19 09/27/19  Orvil Feil, PA-C    Allergies Patient has no known allergies.  No family history on file.  Social History Social History   Tobacco Use  . Smoking status: Current Every Day Smoker    Packs/day: 0.50    Types: Cigarettes  . Smokeless tobacco: Never Used  Vaping Use  . Vaping Use: Never used  Substance Use Topics  . Alcohol use: No  . Drug use: Yes    Types: Marijuana     Review of Systems  Constitutional: No fever/chills Eyes:  No discharge ENT: No upper respiratory complaints. Respiratory: no cough. No SOB/ use of accessory muscles to breath Gastrointestinal:   No nausea, no vomiting.  No diarrhea.  No  constipation. Musculoskeletal: Patient has right knee pain.  Skin: Negative for rash, abrasions, lacerations, ecchymosis.    ____________________________________________   PHYSICAL EXAM:  VITAL SIGNS: ED Triage Vitals  Enc Vitals Group     BP 09/20/19 1923 (!) 156/95     Pulse Rate 09/20/19 1923 71     Resp 09/20/19 1923 18     Temp 09/20/19 1923 98 F (36.7 C)     Temp Source 09/20/19 1923 Oral     SpO2 09/20/19 1923 100 %     Weight 09/20/19 1922 190 lb (86.2 kg)     Height 09/20/19 1922 5\' 9"  (1.753 m)     Head Circumference --      Peak Flow --      Pain Score 09/20/19 1922 8     Pain Loc --      Pain Edu? --      Excl. in GC? --      Constitutional: Alert and oriented. Well appearing and in no acute distress. Eyes: Conjunctivae are normal. PERRL. EOMI. Head: Atraumatic.  Cardiovascular: Normal rate, regular rhythm. Normal S1 and S2.  Good peripheral circulation. Respiratory: Normal respiratory effort without tachypnea or retractions. Lungs CTAB. Good air entry to the bases with no decreased or absent breath sounds Gastrointestinal: Bowel sounds x 4 quadrants. Soft and nontender to palpation. No guarding or rigidity. No  distention. Musculoskeletal: Full range of motion to all extremities. No obvious deformities noted. Neurologic:  Normal for age. No gross focal neurologic deficits are appreciated.  Skin:  Skin is warm, dry and intact. No rash noted. Psychiatric: Mood and affect are normal for age. Speech and behavior are normal.   ____________________________________________   LABS (all labs ordered are listed, but only abnormal results are displayed)  Labs Reviewed - No data to display ____________________________________________  EKG   ____________________________________________  RADIOLOGY Geraldo Pitter, personally viewed and evaluated these images (plain radiographs) as part of my medical decision making, as well as reviewing the written report by  the radiologist.  DG Knee Complete 4 Views Right  Result Date: 09/20/2019 CLINICAL DATA:  Right lateral knee pain for 4-5 months EXAM: RIGHT KNEE - COMPLETE 4+ VIEW COMPARISON:  None. FINDINGS: Frontal, bilateral oblique, lateral views of the right knee demonstrate no fractures. Alignment is anatomic. Mild lateral joint space narrowing and osteophyte formation consistent with osteoarthritis. No joint effusion. IMPRESSION: 1. Mild lateral compartmental osteoarthritis. No acute bony abnormality. Electronically Signed   By: Sharlet Salina M.D.   On: 09/20/2019 19:41    ____________________________________________    PROCEDURES  Procedure(s) performed:     Procedures     Medications  ketorolac (TORADOL) 30 MG/ML injection 30 mg (30 mg Intramuscular Given 09/20/19 2218)     ____________________________________________   INITIAL IMPRESSION / ASSESSMENT AND PLAN / ED COURSE  Pertinent labs & imaging results that were available during my care of the patient were reviewed by me and considered in my medical decision making (see chart for details).    Assessment and Plan:  Acute right knee pain 54 year old male presents to the emergency department with knee pain for the past 3 months  Vital signs are reassuring at triage.  On physical exam, patient was able to demonstrate full range of motion at the right knee.  No deficits were noted with provocative testing at the right knee patient was discharged with meloxicam and advised to follow-up with orthopedics as needed.    ____________________________________________  FINAL CLINICAL IMPRESSION(S) / ED DIAGNOSES  Final diagnoses:  Acute pain of right knee      NEW MEDICATIONS STARTED DURING THIS VISIT:  ED Discharge Orders         Ordered    meloxicam (MOBIC) 15 MG tablet  Daily        09/20/19 2226              This chart was dictated using voice recognition software/Dragon. Despite best efforts to proofread, errors  can occur which can change the meaning. Any change was purely unintentional.     Orvil Feil, PA-C 09/20/19 2245    Arnaldo Natal, MD 09/20/19 2325

## 2019-12-24 IMAGING — MR MR HEAD W/O CM
9 of 10 series · 42 of 48 positions shown · non-contrast
Comparison: Head CT 12/03/2017

CLINICAL DATA: New onset seizure.  Ataxia.

EXAM:
MRI HEAD WITHOUT CONTRAST
TECHNIQUE: Multiplanar, multiecho pulse sequences of the brain and surrounding
structures were obtained without intravenous contrast.

[Series 2: ax dwi_tracew · axial · 3.0mm · 0.83mm/px · z∈[-59,+103]mm · 7 of 55 slices shown]
[im 1/55]
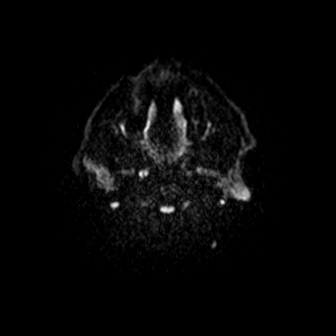
[im 10/55]
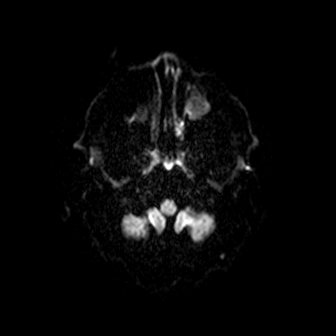
[im 19/55]
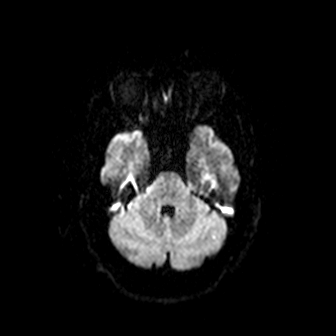
[im 28/55]
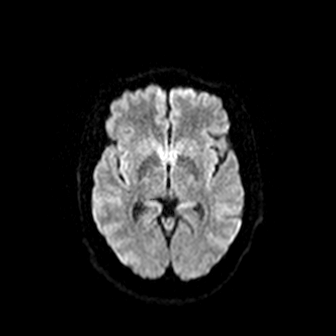
[im 37/55]
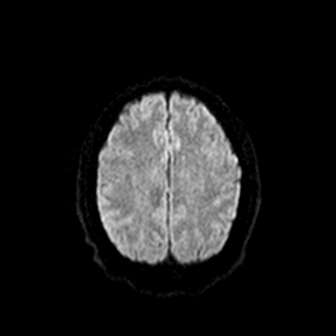
[im 46/55]
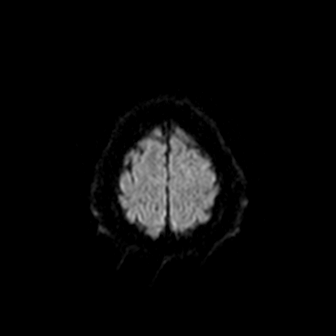
[im 55/55]
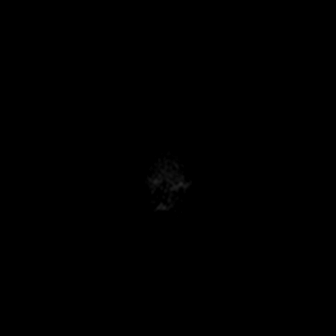

[Series 3: ax dwi_adc · axial · 3.0mm · 0.83mm/px · z∈[-59,+103]mm · 8 of 55 slices shown]
[im 1/55]
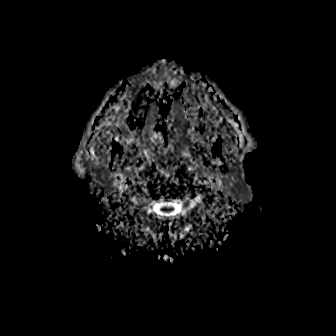
[im 8/55]
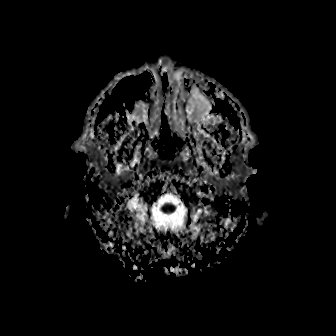
[im 16/55]
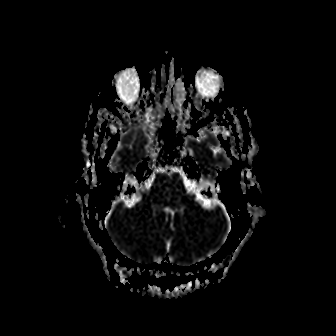
[im 24/55]
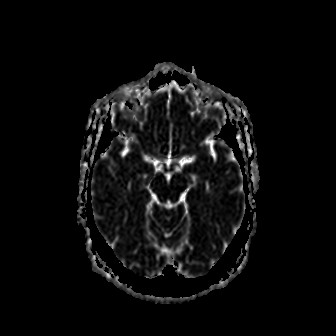
[im 31/55]
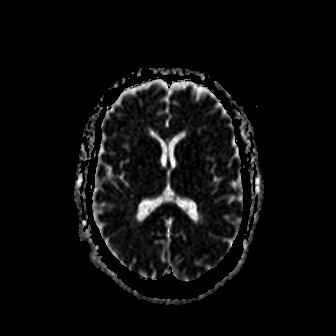
[im 39/55]
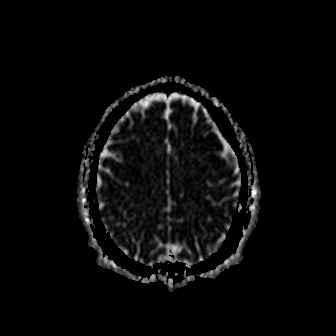
[im 47/55]
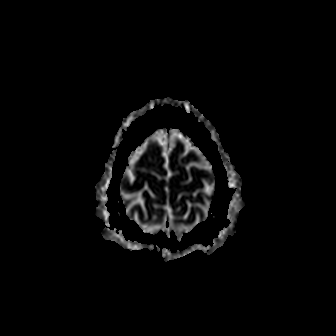
[im 55/55]
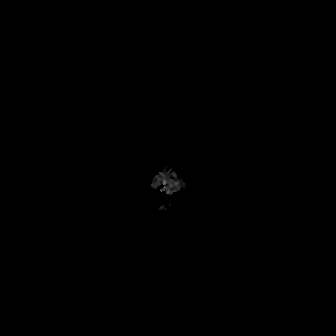

[Series 4: cor dwi_tracew · coronal · 5.0mm · 0.68mm/px · 5 of 38 slices shown]
[im 1/38]
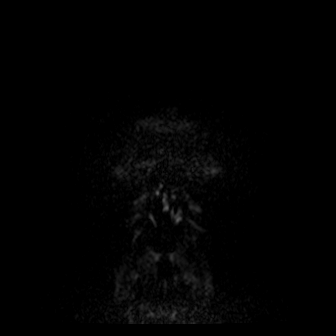
[im 10/38]
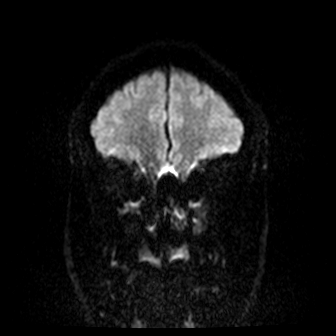
[im 19/38]
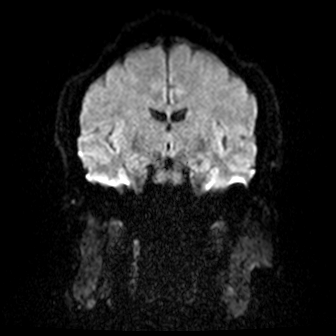
[im 28/38]
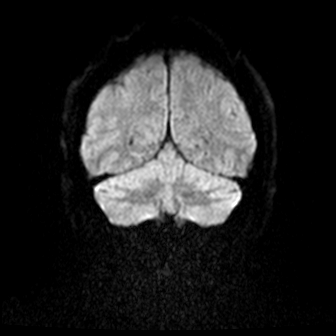
[im 38/38]
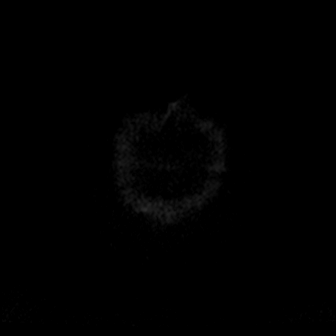

[Series 5: cor dwi_adc · coronal · 5.0mm · 0.68mm/px · 3 of 38 slices shown]
[im 1/38]
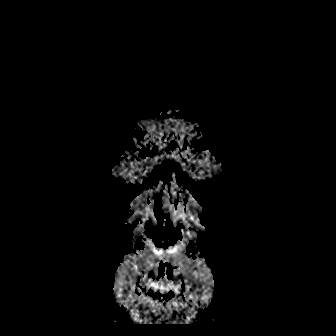
[im 10/38]
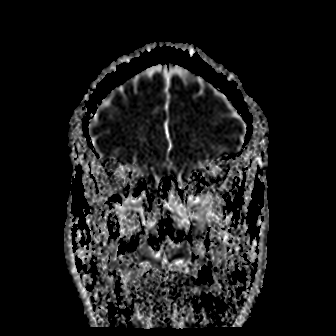
[im 19/38]
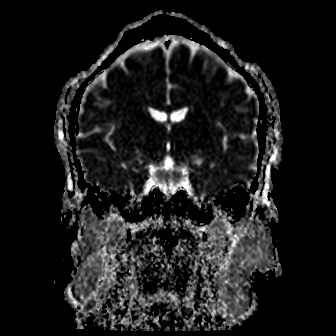

[Series 6: T1 · sagittal · 5.0mm · 0.94mm/px · 3 of 25 slices shown (1 of 2)]
[im 1/25]
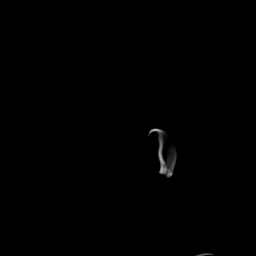
[im 13/25]
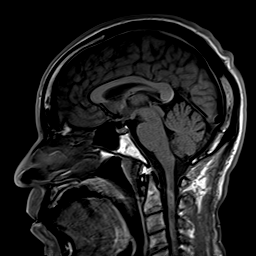
[im 25/25]
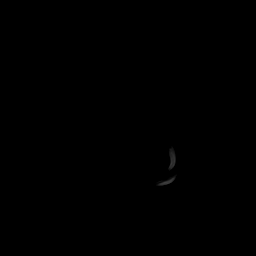

[Series 7: FLAIR · axial · 5.0mm · 1.20mm/px · z∈[-53,+103]mm · 4 of 27 slices shown]
[im 1/27]
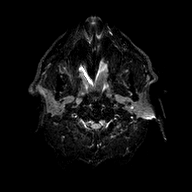
[im 9/27]
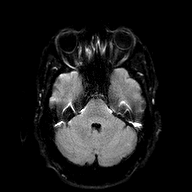
[im 18/27]
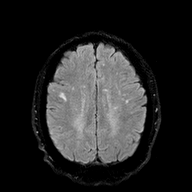
[im 27/27]
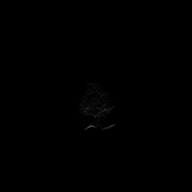

[Series 9: T2 · axial · 5.0mm · 0.45mm/px · z∈[-53,+103]mm · 4 of 27 slices shown (1 of 2)]
[im 1/27]
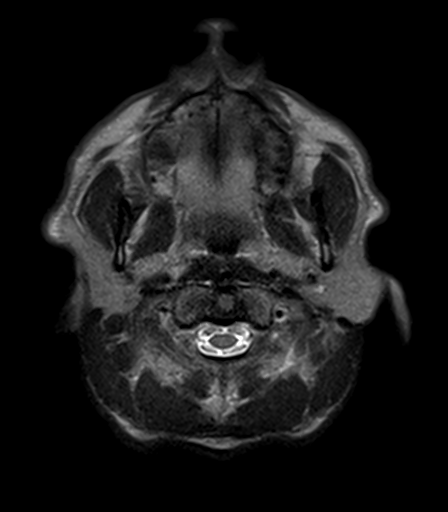
[im 9/27]
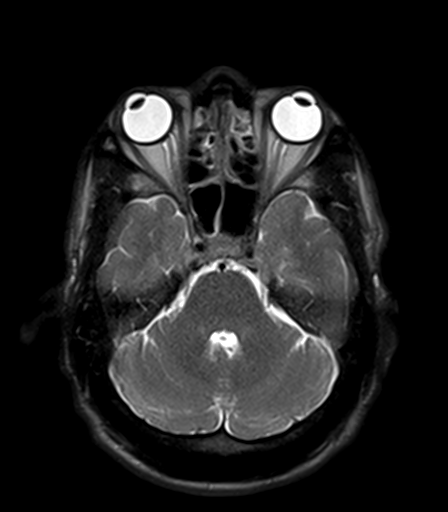
[im 18/27]
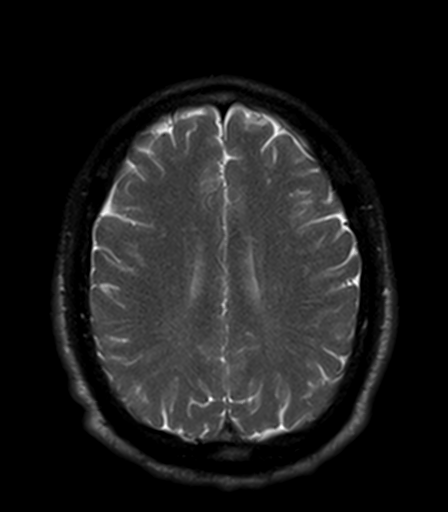
[im 27/27]
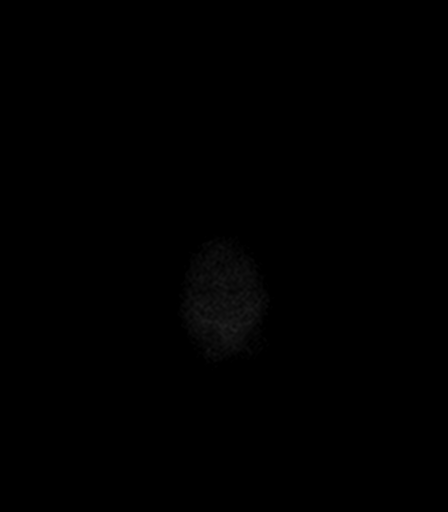

[Series 10: T1 · axial · 5.0mm · 0.90mm/px · z∈[-53,+103]mm · 4 of 27 slices shown (2 of 2)]
[im 1/27]
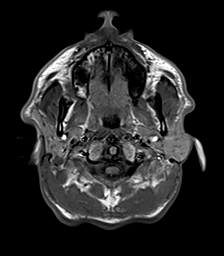
[im 9/27]
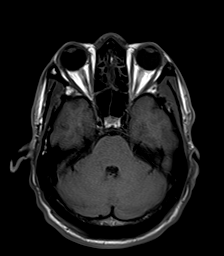
[im 18/27]
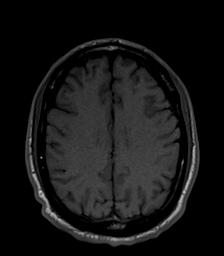
[im 27/27]
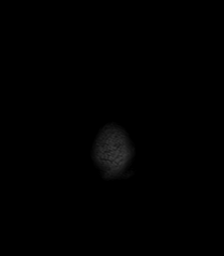

[Series 11: T2 · coronal · 5.0mm · 0.45mm/px · 4 of 32 slices shown (2 of 2)]
[im 1/32]
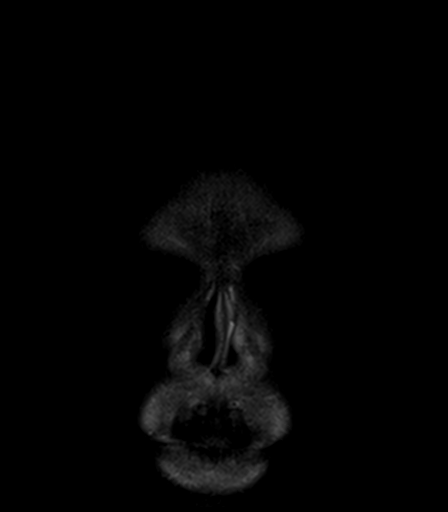
[im 11/32]
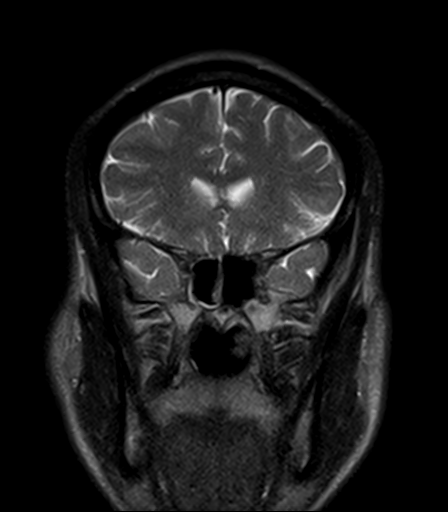
[im 21/32]
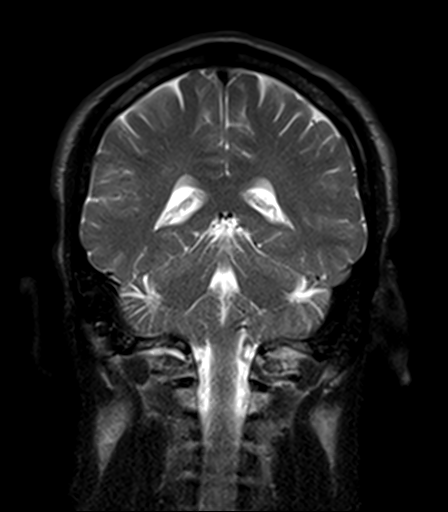
[im 32/32]
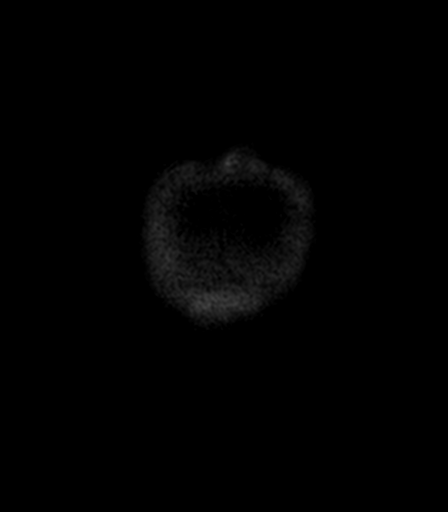

[42 of 48 positions shown; findings below may reference images not displayed]

FINDINGS: Brain: There are 2 punctate foci of mild diffusion weighted and T2
FLAIR hyperintensity, one in each cerebellar hemisphere, without
clearly reduced ADC though small size limits evaluation. No acute
supratentorial infarct is identified. Subcentimeter focus of
susceptibility artifact along the inferomedial aspect of the right
occipital lobe corresponds to tentorial calcification on CT. No
intracranial hemorrhage, mass, midline shift, or extra-axial fluid
collection is identified. Patchy T2 hyperintensities in the cerebral
white matter bilaterally are nonspecific but compatible with chronic
small vessel ischemic disease, moderately advanced for age. The
ventricles and sulci are normal. Dedicated seizure protocol temporal
lobe imaging was not performed.

Vascular: Major intracranial vascular flow voids are preserved.

Skull and upper cervical spine: Unremarkable bone marrow signal.

Sinuses/Orbits: Unremarkable orbits. Complete opacification of the
left maxillary sinus. Mucous retention cysts or polyps in the right
maxillary sinus. Mild mucosal thickening in the other sinuses. Trace
right mastoid fluid.

Other: None.
IMPRESSION: 1. Punctate acute or subacute bilateral cerebellar infarcts.
2. Moderately age advanced chronic small vessel ischemia in the
cerebral white matter.

## 2020-02-07 ENCOUNTER — Other Ambulatory Visit: Payer: Self-pay

## 2020-02-07 ENCOUNTER — Encounter: Payer: Self-pay | Admitting: Emergency Medicine

## 2020-02-07 ENCOUNTER — Emergency Department
Admission: EM | Admit: 2020-02-07 | Discharge: 2020-02-07 | Disposition: A | Discharge status: Home / Self Care | Payer: Self-pay | Attending: Emergency Medicine | Admitting: Emergency Medicine

## 2020-02-07 DIAGNOSIS — K047 Periapical abscess without sinus: Secondary | ICD-10-CM | POA: Insufficient documentation

## 2020-02-07 DIAGNOSIS — F17210 Nicotine dependence, cigarettes, uncomplicated: Secondary | ICD-10-CM | POA: Insufficient documentation

## 2020-02-07 DIAGNOSIS — I10 Essential (primary) hypertension: Secondary | ICD-10-CM | POA: Insufficient documentation

## 2020-02-07 DIAGNOSIS — Z7982 Long term (current) use of aspirin: Secondary | ICD-10-CM | POA: Insufficient documentation

## 2020-02-07 MED ORDER — AMOXICILLIN-POT CLAVULANATE 875-125 MG PO TABS: 1.0000 | ORAL_TABLET | Freq: Two times a day (BID) | ORAL | 0 refills | Status: DC

## 2020-02-07 MED ORDER — AMOXICILLIN-POT CLAVULANATE 875-125 MG PO TABS
1.0000 | ORAL_TABLET | Freq: Once | ORAL | Status: AC
  Administered 2020-02-07: 1 via ORAL
  Filled 2020-02-07: qty 1

## 2020-02-07 MED ORDER — LIDOCAINE-EPINEPHRINE 2 %-1:100000 IJ SOLN
1.7000 mL | Freq: Once | INTRAMUSCULAR | Status: AC
  Administered 2020-02-07: 1.7 mL
  Filled 2020-02-07: qty 1.7

## 2020-02-07 NOTE — Discharge Instructions (Signed)
OPTIONS FOR DENTAL FOLLOW UP CARE ° °[place] [facility] - Local Safety Net Dental Clinics °http://www.ncdhhs.gov/dph/oralhealth/services/safetynetclinics.htm °  °[facility] (336-562-3123) ° °[facility] [place] ([phone]) ° °[facility] ([phone] ext [phone]) ° °[facility] ([phone]) ° °[facility] ([phone]) °This clinic caters to the indigent population and is on a lottery system. °Location: °[facility], [address], [place] °Clinic Hours: °Wednesdays from 6pm - 9pm, patients seen by a lottery system. °For dates, call or go to www.med.[facility].edu/[facility]/patients/Dental-[facility] °Services: °Cleanings, fillings and simple extractions. °Payment Options: °DENTAL WORK IS FREE OF CHARGE. Bring proof of income or support. °Best way to get seen: °Arrive at 5:15 pm - this is a lottery, NOT first come/first serve, so arriving earlier will not increase your chances of being seen. °  °  °[facility] °[phone] °Select option 1 for emergencies °  °Location: °[facility], [address], [place] °Clinic Hours: °No walk-ins accepted - call the day before to schedule an appointment. °Check in times are 9:30 am and 1:30 pm. °Services: °Simple extractions, temporary fillings, pulpectomy/pulp debridement, uncomplicated abscess drainage. °Payment Options: °PAYMENT IS DUE AT THE TIME OF SERVICE.  Fee is usually $100-200, additional surgical procedures (e.g. abscess drainage) may be extra. °Cash, checks, Visa/MasterCard accepted.  Can file Medicaid if patient is covered for dental - patient should call case worker to check. °No discount for [facility] patients. °Best way to get seen: °MUST call the day before and get onto the schedule. Can usually be seen the next 1-2 days. No walk-ins accepted. °  °  °[facility] °[phone] °   °Location: °[facility], [address], [place] °Clinic Hours: °M, W, Th, F 8am or 1:30pm, Tues 9a or 1:30 - first come/first served. °Services: °Simple extractions, temporary fillings, uncomplicated abscess drainage.  You do not need to be an [place] resident. °Payment Options: °PAYMENT IS DUE AT THE TIME OF SERVICE. °Dental insurance, otherwise sliding scale - bring proof of income or support. °Depending on income and treatment needed, cost is usually $50-200. °Best way to get seen: °Arrive early as it is first come/first served. °  °  °[facility] °[phone] °  °Location: °[address] °Clinic Hours: °Mon-Thu 8a-5p °Services: °Most basic dental services including extractions and fillings. °Payment Options: °PAYMENT IS DUE AT THE TIME OF SERVICE. °Sliding scale, up to 50% off - bring proof if income or support. °Medicaid with dental option accepted. °Best way to get seen: °Call to schedule an appointment, can usually be seen within 2 weeks OR they will try to see walk-ins - show up at 8a or 2p (you may have to wait). °  °  °[facility] °[phone] °[place] RESIDENTS ONLY °  °Location: °[facility], [address], [place], [place] [address] °Clinic Hours: By appointment only. °Monday - Thursday 8am-5pm, Friday 8am-12pm °Services: Cleanings, fillings, extractions. °Payment Options: °PAYMENT IS DUE AT THE TIME OF SERVICE. °Cash, Visa or MasterCard. Sliding scale - $30 minimum per service. °Best way to get seen: °Come in to office, complete packet and make an appointment - need proof of income °or support monies for each household member and proof of [place] residence. °Usually takes about a month to get in. °  °  °[facility] °[phone] °  °Location: °[address]   [place] °Clinic Hours: Walk-in Urgent Care Dental Services are offered Monday-Friday  mornings only. °The numbers of emergencies accepted daily is limited to the number of °providers available. °Maximum 15 - Mondays, Wednesdays & Thursdays °Maximum 10 - Tuesdays & Fridays °Services: °You do not need to be a [place] resident to be seen for a dental emergency. °Emergencies are defined as pain, swelling, abnormal bleeding, or dental trauma. Walkins will receive x-rays if needed. °NOTE: Dental cleaning is not an emergency. °Payment Options: °PAYMENT IS DUE AT THE TIME OF SERVICE. °Minimum co-pay is $40.00 for uninsured patients. °Minimum co-pay is $3.00 for Medicaid with dental coverage. °Dental Insurance is accepted and must be presented at time of visit. °Medicare does not cover dental. °Forms of payment: Cash, credit card, checks. °Best way to get seen: °If not previously registered with the clinic, walk-in dental registration begins at 7:15 am and is on a first come/first serve basis. °If previously registered with the clinic, call to make an appointment. °  °  °[facility] °[phone] °[place] RESIDENTS ONLY °  °Location: °[address], [place], [place] °Clinic Hours: °Mon-Thu 10a-2p °Services: Extractions only! °Payment Options: °FREE (donations accepted) - bring proof of income or support °Best way to get seen: °Call and schedule an appointment OR come at 8am on the 1st Monday of every month (except for holidays) when it is first come/first served. °  °  °[facility] °[phone] °  °Location: °[address], [place] °Clinic Hours: °Friday mornings °Services, Payment Options, Best way to get seen: °Call for info °

## 2020-02-07 NOTE — ED Provider Notes (Signed)
[facility] Emergency Department Provider Note  ____________________________________________  Time seen: Approximately 8:33 PM  I have reviewed the triage vital signs and the nursing notes.   HISTORY  Chief Complaint Dental Pain    HPI Christopher Spencer is a 55 y.o. male who presents the emergency department complaining of left upper dental pain.  Patient states that he has had dental pain x2 weeks.  Now he is having increasing edema and pain.  No fevers or chills.  No difficulty breathing or swallowing.  Patient states that he has poor dentition and should see a dentist but has been unable to afford to do so.  No other complaints at this time.         Past Medical History:  Diagnosis Date  . Hypertension     Patient Active Problem List   Diagnosis Date Noted  . Seizure (HCC) 12/04/2017    Past Surgical History:  Procedure Laterality Date  . CHOLECYSTECTOMY      Prior to Admission medications   Medication Sig Start Date End Date Taking? Authorizing Provider  amoxicillin-clavulanate (AUGMENTIN) 875-125 MG tablet Take 1 tablet by mouth 2 (two) times daily. 02/07/20  Yes Avilyn Virtue, Delorise Royals, PA-C  aspirin EC 81 MG tablet Take 1 tablet (81 mg total) by mouth daily. 12/05/17   Salary, Evelena Asa, MD    Allergies Patient has no known allergies.  No family history on file.  Social History Social History   Tobacco Use  . Smoking status: Current Every Day Smoker    Packs/day: 0.50    Types: Cigarettes  . Smokeless tobacco: Never Used  Vaping Use  . Vaping Use: Never used  Substance Use Topics  . Alcohol use: No  . Drug use: Yes    Types: Marijuana     Review of Systems  Constitutional: No fever/chills Eyes: No visual changes. No discharge ENT: Positive for left upper dental pain Cardiovascular: no chest pain. Respiratory: no cough. No SOB. Gastrointestinal: No abdominal pain.  No nausea, no vomiting.  No diarrhea.  No  constipation. Musculoskeletal: Negative for musculoskeletal pain. Skin: Negative for rash, abrasions, lacerations, ecchymosis. Neurological: Negative for headaches, focal weakness or numbness.  10 System ROS otherwise negative.  ____________________________________________   PHYSICAL EXAM:  VITAL SIGNS: ED Triage Vitals  Enc Vitals Group     BP 02/07/20 1630 130/66     Pulse Rate 02/07/20 1630 80     Resp 02/07/20 1630 18     Temp 02/07/20 1630 98 F (36.7 C)     Temp src --      SpO2 02/07/20 1630 99 %     Weight 02/07/20 1624 190 lb 0.6 oz (86.2 kg)     Height 02/07/20 1624 5\' 9"  (1.753 m)     Head Circumference --      Peak Flow --      Pain Score 02/07/20 1623 7     Pain Loc --      Pain Edu? --      Excl. in GC? --      Constitutional: Alert and oriented. Well appearing and in no acute distress. Eyes: Conjunctivae are normal. PERRL. EOMI. Head: Atraumatic. ENT:      Ears:       Nose: No congestion/rhinnorhea.      Mouth/Throat: Mucous membranes are moist.  Poor dentition throughout.  Multiple caries, erosion, multiple missing dentition.  Patient has significant erosion of tooth #14.  There is no significant erythema of  the gumline.  There is soft tissue edema of the left cheek with tenderness but no fluctuance.  No extension of edema into the submandibular region.  No peri-orbital edema. Neck: No stridor.  No erythema or edema of the anterior neck Cardiovascular: Normal rate, regular rhythm. Normal S1 and S2.  Good peripheral circulation. Respiratory: Normal respiratory effort without tachypnea or retractions. Lungs CTAB. Good air entry to the bases with no decreased or absent breath sounds. Musculoskeletal: Full range of motion to all extremities. No gross deformities appreciated. Neurologic:  Normal speech and language. No gross focal neurologic deficits are appreciated.  Skin:  Skin is warm, dry and intact. No rash noted. Psychiatric: Mood and affect are normal.  Speech and behavior are normal. Patient exhibits appropriate insight and judgement.   ____________________________________________   LABS (all labs ordered are listed, but only abnormal results are displayed)  Labs Reviewed - No data to display ____________________________________________  EKG   ____________________________________________  RADIOLOGY   No results found.  ____________________________________________    PROCEDURES  Procedure(s) performed:    .Nerve Block  Date/Time: 02/07/2020 8:35 PM Performed by: Racheal Patches, PA-C Authorized by: Racheal Patches, PA-C   Consent:    Consent obtained:  Verbal   Consent given by:  Patient   Risks, benefits, and alternatives were discussed: yes     Risks discussed:  Bleeding, pain and unsuccessful block Universal protocol:    Procedure explained and questions answered to patient or proxy's satisfaction: yes     Patient identity confirmed:  Verbally with patient Indications:    Indications:  Pain relief Location:    Body area:  Head   Head nerve blocked: Left posterior superior alveolar nerve.   Laterality:  Left Skin anesthesia:    Skin anesthesia method:  None Procedure details:    Block needle gauge:  27 G   Anesthetic injected:  Lidocaine 1% WITH epi   Steroid injected:  None   Additive injected:  None   Injection procedure:  Anatomic landmarks identified, introduced needle, negative aspiration for blood and incremental injection Post-procedure details:    Dressing:  None   Outcome:  Anesthesia achieved   Procedure completion:  Tolerated well, no immediate complications Comments:     Dental block performed using 27-gauge needle.  1.7 mL of lidocaine with epi is administered into the left posterior superior alveolar nerve.  Patient tolerated well and no complications.  Good anesthesia.      Medications  lidocaine-EPINEPHrine (XYLOCAINE W/EPI) 2 %-1:100000 (with pres) injection 1.7 mL  (1.7 mLs Infiltration Given 02/07/20 2019)  amoxicillin-clavulanate (AUGMENTIN) 875-125 MG per tablet 1 tablet (1 tablet Oral Given 02/07/20 2018)     ____________________________________________   INITIAL IMPRESSION / ASSESSMENT AND PLAN / ED COURSE  Pertinent labs & imaging results that were available during my care of the patient were reviewed by me and considered in my medical decision making (see chart for details).  Review of the [place] CSRS was performed in accordance of the [facility] prior to dispensing any controlled drugs.           Patient's diagnosis is consistent with dental abscess.  Patient presents emergency department with left upper dental pain, erythema and edema of the left cheek.  Symptoms have been ongoing x2 weeks.  He does not have a dentist.  Findings are consistent with dental infection with no appreciable drainable fluid collection.  No indication for labs or imaging.  Patient is started on Augmentin here in the emergency  department and will be transition to outpatient Augmentin.  Dental block is performed for symptom relief as described above.  Patient tolerated well with no complications.  Tylenol and Motrin at home for pain relief.  Patient is given multiple dental follow-up options.  Follow-up with dentist.  Return precautions discussed with the patient. Patient is given ED precautions to return to the ED for any worsening or new symptoms.     ____________________________________________  FINAL CLINICAL IMPRESSION(S) / ED DIAGNOSES  Final diagnoses:  Dental abscess      NEW MEDICATIONS STARTED DURING THIS VISIT:  ED Discharge Orders         Ordered    amoxicillin-clavulanate (AUGMENTIN) 875-125 MG tablet  2 times daily        02/07/20 2007              This chart was dictated using voice recognition software/Dragon. Despite best efforts to proofread, errors can occur which can change the meaning. Any change was purely unintentional. h    Tierra Divelbiss, Renold Don 02/07/20 2038    Arnaldo Natal, MD 02/08/20 [phone]

## 2020-02-07 NOTE — ED Notes (Signed)
See triage note, pt reports abscess to left upper tooth, swelling noted to left cheek area.  Ambulatory to triage, RR even and unlabored, NAD noted

## 2020-02-07 NOTE — ED Triage Notes (Signed)
Left upper jaw pain and swelling x 1 week.

## 2020-08-06 ENCOUNTER — Other Ambulatory Visit: Payer: Self-pay

## 2020-08-06 ENCOUNTER — Emergency Department
Admission: EM | Admit: 2020-08-06 | Discharge: 2020-08-06 | Disposition: A | Discharge status: Home / Self Care | Payer: Self-pay | Attending: Emergency Medicine | Admitting: Emergency Medicine

## 2020-08-06 DIAGNOSIS — K029 Dental caries, unspecified: Secondary | ICD-10-CM | POA: Insufficient documentation

## 2020-08-06 DIAGNOSIS — Z7982 Long term (current) use of aspirin: Secondary | ICD-10-CM | POA: Insufficient documentation

## 2020-08-06 DIAGNOSIS — I10 Essential (primary) hypertension: Secondary | ICD-10-CM | POA: Insufficient documentation

## 2020-08-06 MED ORDER — AMOXICILLIN 875 MG PO TABS: 875.0000 mg | ORAL_TABLET | Freq: Two times a day (BID) | ORAL | 0 refills | Status: DC

## 2020-08-06 MED ORDER — IBUPROFEN 600 MG PO TABS: 600.0000 mg | ORAL_TABLET | Freq: Four times a day (QID) | ORAL | 0 refills | Status: DC | PRN

## 2020-08-06 NOTE — Discharge Instructions (Addendum)
Follow-up with a dentist as soon as possible. OPTIONS FOR DENTAL FOLLOW UP CARE  [place] Department of Health and Human Services - Local Safety Net Dental Clinics [email or web address]   [facility] [phone])  Christopher Spencer [phone])  [facility] [phone])  [facility] [phone])  [facility] [phone]) This clinic caters to the indigent population and is on a lottery system. Location: [employer] [facility], [employer], [address], [place] Clinic Hours: Wednesdays from 6pm - 9pm, patients seen by a lottery system. For dates, call or go to [email or web address] Services: Cleanings, fillings and simple extractions. Payment Options: DENTAL WORK IS FREE OF CHARGE. Bring proof of income or support. Best way to get seen: Arrive at 5:15 pm - this is a lottery, NOT first come/first serve, so arriving earlier will not increase your chances of being seen.     [facility] [phone] Select option 1 for emergencies   Location: [facility], [place], [address] [place] Clinic Hours: No walk-ins accepted - call the day before to schedule an appointment. Check in times are 9:30 am and 1:30 pm. Services: Simple extractions, temporary fillings, pulpectomy/pulp debridement, uncomplicated abscess drainage. Payment Options: PAYMENT IS DUE AT THE TIME OF SERVICE.  Fee is usually $100-200, additional surgical procedures (e.g. abscess drainage) may be extra. Cash, checks, Visa/MasterCard accepted.  Can file Medicaid if patient is covered for dental - patient should call case worker to check. No discount for [facility] patients. Best way to get seen: MUST call the day before and get onto the schedule. Can usually be seen the next 1-2 days. No walk-ins accepted.      [facility] [phone]   Location: [facility], [address] [place] Clinic Hours: M, W, Th, F 8am or 1:30pm, Tues 9a or 1:30 - first come/first served. Services: Simple extractions, temporary fillings, uncomplicated abscess drainage.  You do not need to be an [facility] resident. Payment Options: PAYMENT IS DUE AT THE TIME OF SERVICE. Dental insurance, otherwise sliding scale - bring proof of income or support. Depending on income and treatment needed, cost is usually $50-200. Best way to get seen: Arrive early as it is first come/first served.     [facility] [phone]   Location: [address] Clinic Hours: Mon-Thu 8a-5p Services: Most basic dental services including extractions and fillings. Payment Options: PAYMENT IS DUE AT THE TIME OF SERVICE. Sliding scale, up to 50% off - bring proof if income or support. Medicaid with dental option accepted. Best way to get seen: Call to schedule an appointment, can usually be seen within 2 weeks OR they will try to see walk-ins - show up at 8a or 2p (you may have to wait).     [facility] [phone] [place] RESIDENTS ONLY   Location: [facility], [address], [place], [place] [address] Clinic Hours: By appointment only. Monday - Thursday 8am-5pm, Friday 8am-12pm Services: Cleanings, fillings, extractions. Payment Options: PAYMENT IS DUE AT THE TIME OF SERVICE. Cash, Visa or MasterCard. Sliding scale - $30 minimum per service. Best way to get seen: Come in to office, complete packet and make an appointment - need proof of income or support monies for each household member and proof of [facility] residence. Usually takes about a month to get in.     [facility] [phone]   Location: [address], [facility] Hours: Walk-in Urgent Care  Dental Services are offered Monday-Friday mornings only. The numbers of emergencies accepted daily is limited to the number of providers available. Maximum 15 - Mondays, Wednesdays & Thursdays Maximum 10 - Tuesdays & Fridays Services: You do not need to be a [facility] resident to be seen for a dental emergency. Emergencies are defined as pain, swelling, abnormal bleeding, or dental trauma. Walkins will receive x-rays if needed. NOTE: Dental cleaning is not an emergency. Payment Options: PAYMENT IS DUE AT THE TIME OF SERVICE. Minimum co-pay is $40.00 for uninsured patients. Minimum co-pay is $3.00 for Medicaid with dental coverage. Dental Insurance is accepted and must be presented at time of visit. Medicare does not cover dental. Forms of payment: Cash, credit card, checks. Best way to get seen: If not previously registered with the clinic, walk-in dental registration begins at 7:15 am and is on a first come/first serve basis. If previously registered with the clinic, call to make an appointment.     [facility] [phone] [place] RESIDENTS ONLY   Location: [address] [place], [place] Clinic Hours: Mon-Thu 10a-2p Services: Extractions only! Payment Options: FREE (donations accepted) - bring proof of income or support Best way to get seen: Call and schedule an appointment OR come at 8am on the 1st Monday of every month (except for holidays) when it is first come/first served.     [facility] [phone]   Location: [address] [place], [place] Clinic Hours: Friday mornings Services, Payment Options, Best way to get seen: Call for info

## 2020-08-06 NOTE — ED Provider Notes (Signed)
[facility] Emergency Department Provider Note  ____________________________________________   Event Date/Time   First MD Initiated Contact with Patient 08/06/20 [phone]     (approximate)  I have reviewed the triage vital signs and the nursing notes.   HISTORY  Chief Complaint Dental Pain   HPI Christopher Spencer is a 55 y.o. male presents to the ED with complaint of dental pain.  Patient reports he has had pain on the left upper and lower dental area with radiation into his ear.  He has had dental problems for some time and has not seen a dentist.  He rates pain as an 8 out of 10.         Past Medical History:  Diagnosis Date   Hypertension     Patient Active Problem List   Diagnosis Date Noted   Seizure (HCC) 12/04/2017    Past Surgical History:  Procedure Laterality Date   CHOLECYSTECTOMY      Prior to Admission medications   Medication Sig Start Date End Date Taking? Authorizing Provider  amoxicillin (AMOXIL) 875 MG tablet Take 1 tablet (875 mg total) by mouth 2 (two) times daily. 08/06/20  Yes Bridget Hartshorn L, PA-C  ibuprofen (ADVIL) 600 MG tablet Take 1 tablet (600 mg total) by mouth every 6 (six) hours as needed. 08/06/20  Yes Tommi Rumps, PA-C  aspirin EC 81 MG tablet Take 1 tablet (81 mg total) by mouth daily. 12/05/17   Salary, Evelena Asa, MD    Allergies Patient has no known allergies.  No family history on file.  Social History Social History   Tobacco Use   Smoking status: Every Day    Packs/day: 0.50    Types: Cigarettes   Smokeless tobacco: Never  Vaping Use   Vaping Use: Never used  Substance Use Topics   Alcohol use: No   Drug use: Yes    Types: Marijuana    Review of Systems Constitutional: No fever/chills Eyes: No visual changes. ENT: Positive for dental pain. Cardiovascular: Denies chest pain. Respiratory: Denies shortness of breath. Gastrointestinal: No abdominal pain.  No nausea, no vomiting.    Musculoskeletal: Negative for muscle skeletal pain. Skin: Negative for rash. Neurological: Negative for headaches, focal weakness or numbness. ____________________________________________   PHYSICAL EXAM:  VITAL SIGNS: ED Triage Vitals  Enc Vitals Group     BP 08/06/20 0810 (!) 162/93     Pulse Rate 08/06/20 0810 94     Resp 08/06/20 0810 18     Temp 08/06/20 0810 98.6 F (37 C)     Temp Source 08/06/20 0810 Oral     SpO2 08/06/20 0810 98 %     Weight 08/06/20 0807 200 lb (90.7 kg)     Height 08/06/20 0807 5\' 8"  (1.727 m)     Head Circumference --      Peak Flow --      Pain Score 08/06/20 0807 8     Pain Loc --      Pain Edu? --      Excl. in GC? --     Constitutional: Alert and oriented. Well appearing and in no acute distress. Eyes: Conjunctivae are normal.  Head: Atraumatic. Nose: No congestion/rhinnorhea. Mouth/Throat: Mucous membranes are moist.  Oropharynx non-erythematous.  Multiple dental caries are noted left upper premolar and molar area.  No active drainage noted.  Gums are edematous. Neck: No stridor.   Cardiovascular: Normal rate, regular rhythm. Grossly normal heart sounds.  Good peripheral circulation.  Respiratory: Normal respiratory effort.  No retractions. Lungs CTAB. Musculoskeletal: Moves upper and lower extremities they have difficulty.  Normal gait was noted. Neurologic:  Normal speech and language. No gross focal neurologic deficits are appreciated. No gait instability. Skin:  Skin is warm, dry and intact. No rash noted. Psychiatric: Mood and affect are normal. Speech and behavior are normal.  ____________________________________________   LABS (all labs ordered are listed, but only abnormal results are displayed)  Labs Reviewed - No data to display ____________________________________________  PROCEDURES  Procedure(s) performed (including Critical Care):  Procedures   ____________________________________________   INITIAL IMPRESSION  / ASSESSMENT AND PLAN / ED COURSE  As part of my medical decision making, I reviewed the following data within the electronic MEDICAL RECORD NUMBER Notes from prior ED visits and [place] Controlled Substance Database  55 year old male presents to the ED with complaint of dental pain.  On exam there are multiple dental caries present especially the left upper premolar and molar area.  Patient was placed on antibiotics and strongly urged to follow-up with one of the dental clinics listed on his discharge papers.   ____________________________________________   FINAL CLINICAL IMPRESSION(S) / ED DIAGNOSES  Final diagnoses:  Pain due to dental caries     ED Discharge Orders          Ordered    amoxicillin (AMOXIL) 875 MG tablet  2 times daily        08/06/20 0845    ibuprofen (ADVIL) 600 MG tablet  Every 6 hours PRN        08/06/20 0845             Note:  This document was prepared using Dragon voice recognition software and may include unintentional dictation errors.    Tommi Rumps, PA-C 08/06/20 0930    Sharyn Creamer, MD 08/06/20 [phone]

## 2020-08-06 NOTE — ED Notes (Signed)
See triage note  Presents with possible dental abscess/infection  States having pain to left gum area and pain radiates into ear   Afebrile on arrival

## 2020-08-06 NOTE — ED Triage Notes (Signed)
Pt c/o left upper and lower tooth pain with ear pain

## 2020-08-24 ENCOUNTER — Emergency Department
Admission: EM | Admit: 2020-08-24 | Discharge: 2020-08-24 | Disposition: A | Discharge status: Home / Self Care | Payer: Self-pay | Attending: Emergency Medicine | Admitting: Emergency Medicine

## 2020-08-24 ENCOUNTER — Other Ambulatory Visit: Payer: Self-pay

## 2020-08-24 DIAGNOSIS — K0889 Other specified disorders of teeth and supporting structures: Secondary | ICD-10-CM | POA: Insufficient documentation

## 2020-08-24 DIAGNOSIS — Z7982 Long term (current) use of aspirin: Secondary | ICD-10-CM | POA: Insufficient documentation

## 2020-08-24 DIAGNOSIS — F17210 Nicotine dependence, cigarettes, uncomplicated: Secondary | ICD-10-CM | POA: Insufficient documentation

## 2020-08-24 DIAGNOSIS — I10 Essential (primary) hypertension: Secondary | ICD-10-CM | POA: Insufficient documentation

## 2020-08-24 MED ORDER — TRAMADOL HCL 50 MG PO TABS: 50.0000 mg | ORAL_TABLET | Freq: Four times a day (QID) | ORAL | 0 refills | Status: DC | PRN

## 2020-08-24 MED ORDER — HYDROCODONE-ACETAMINOPHEN 5-325 MG PO TABS
1.0000 | ORAL_TABLET | Freq: Once | ORAL | Status: AC
Start: 2020-08-24 — End: 2020-08-24
  Administered 2020-08-24: 1 via ORAL
  Filled 2020-08-24: qty 1

## 2020-08-24 MED ORDER — AMOXICILLIN 500 MG PO CAPS: 500.0000 mg | ORAL_CAPSULE | Freq: Three times a day (TID) | ORAL | 0 refills | Status: DC

## 2020-08-24 MED ORDER — AMOXICILLIN 500 MG PO CAPS
500.0000 mg | ORAL_CAPSULE | Freq: Once | ORAL | Status: AC
  Administered 2020-08-24: 500 mg via ORAL
  Filled 2020-08-24: qty 1

## 2020-08-24 MED ORDER — IBUPROFEN 800 MG PO TABS: 800.0000 mg | ORAL_TABLET | Freq: Three times a day (TID) | ORAL | 0 refills | Status: DC | PRN

## 2020-08-24 NOTE — ED Notes (Signed)
Patient verbalizes understanding of discharge instructions. Opportunity for questioning and answers were provided. Armband removed by staff, pt discharged from ED. Ambulated out to lobby  

## 2020-08-24 NOTE — ED Triage Notes (Signed)
Pt states he is having L sided dental pain in the upper and lower that is causing pain in his ear x1 month

## 2020-08-24 NOTE — ED Provider Notes (Signed)
[facility] Emergency Department Provider Note  ____________________________________________   Event Date/Time   First MD Initiated Contact with Patient 08/24/20 [phone]     (approximate)  I have reviewed the triage vital signs and the nursing notes.   HISTORY  Chief Complaint Dental Pain    HPI Christopher Spencer is a 55 y.o. male presents emergency department complaining of left upper tooth pain which radiates into his ear and left jaw.  He denies chest pain or shortness of breath.  No fever or chills.  Patient states that have several teeth need to get pulled but not been able to afford a dentist.  Past Medical History:  Diagnosis Date   Hypertension     Patient Active Problem List   Diagnosis Date Noted   Seizure (HCC) 12/04/2017    Past Surgical History:  Procedure Laterality Date   CHOLECYSTECTOMY      Prior to Admission medications   Medication Sig Start Date End Date Taking? Authorizing Provider  amoxicillin (AMOXIL) 500 MG capsule Take 1 capsule (500 mg total) by mouth 3 (three) times daily. 08/24/20  Yes Ariana Juul, Roselyn Bering, PA-C  ibuprofen (ADVIL) 800 MG tablet Take 1 tablet (800 mg total) by mouth every 8 (eight) hours as needed. 08/24/20  Yes Krysteena Stalker, Roselyn Bering, PA-C  traMADol (ULTRAM) 50 MG tablet Take 1 tablet (50 mg total) by mouth every 6 (six) hours as needed. 08/24/20  Yes Faythe Ghee, PA-C  aspirin EC 81 MG tablet Take 1 tablet (81 mg total) by mouth daily. 12/05/17   Salary, Evelena Asa, MD    Allergies Patient has no known allergies.  No family history on file.  Social History Social History   Tobacco Use   Smoking status: Every Day    Packs/day: 0.50    Types: Cigarettes   Smokeless tobacco: Never  Vaping Use   Vaping Use: Never used  Substance Use Topics   Alcohol use: No   Drug use: Yes    Types: Marijuana    Review of Systems  Constitutional: No fever/chills Eyes: No visual changes. ENT: No sore  throat. Respiratory: Denies cough Cardiovascular: Denies chest pain Genitourinary: Negative for dysuria. Musculoskeletal: Negative for back pain. Skin: Negative for rash. Psychiatric: no mood changes,     ____________________________________________   PHYSICAL EXAM:  VITAL SIGNS: ED Triage Vitals  Enc Vitals Group     BP 08/24/20 0751 (!) 166/92     Pulse Rate 08/24/20 0751 88     Resp 08/24/20 0751 18     Temp 08/24/20 0751 97.9 F (36.6 C)     Temp Source 08/24/20 0751 Oral     SpO2 08/24/20 0751 98 %     Weight 08/24/20 0752 205 lb (93 kg)     Height 08/24/20 0752 5\' 9"  (1.753 m)     Head Circumference --      Peak Flow --      Pain Score 08/24/20 0752 10     Pain Loc --      Pain Edu? --      Excl. in GC? --     Constitutional: Alert and oriented. Well appearing and in no acute distress. Eyes: Conjunctivae are normal.  Head: Atraumatic. Nose: No congestion/rhinnorhea. Ears: Left TM is red and swollen, right TM is normal Mouth/Throat: Mucous membranes are moist.  Left upper molar decayed tender, left TMJ is tender, Neck:  supple no lymphadenopathy noted Cardiovascular: Normal rate, regular rhythm. Heart sounds are  normal Respiratory: Normal respiratory effort.  No retractions, lungs c t a  GU: deferred Musculoskeletal: FROM all extremities, warm and well perfused Neurologic:  Normal speech and language.  Skin:  Skin is warm, dry and intact. No rash noted. Psychiatric: Mood and affect are normal. Speech and behavior are normal.  ____________________________________________   LABS (all labs ordered are listed, but only abnormal results are displayed)  Labs Reviewed - No data to display ____________________________________________   ____________________________________________  RADIOLOGY    ____________________________________________   PROCEDURES  Procedure(s) performed:  No  Procedures    ____________________________________________   INITIAL IMPRESSION / ASSESSMENT AND PLAN / ED COURSE  Pertinent labs & imaging results that were available during my care of the patient were reviewed by me and considered in my medical decision making (see chart for details).   Patient is a 55 year old male presents emergency department with dental pain.  See HPI.  Physical exam shows patient to have some cavities and a red TM.  Did discuss the findings with the patient.  Explained to him he could have infection along the dental area but he could also with the right TM having ear infection.  He was given a prescription for amoxicillin, ibuprofen, and 8 tramadol for pain that is not controlled by ibuprofen.  He was given a list of dental clinics to follow-up with.  He is to return if worsening.  He was discharged stable condition.     Christopher Spencer was evaluated in Emergency Department on 08/24/2020 for the symptoms described in the history of present illness. He was evaluated in the context of the global COVID-19 pandemic, which necessitated consideration that the patient might be at risk for infection with the SARS-CoV-2 virus that causes COVID-19. Institutional protocols and algorithms that pertain to the evaluation of patients at risk for COVID-19 are in a state of rapid change based on information released by regulatory bodies including the CDC and federal and state organizations. These policies and algorithms were followed during the patient's care in the ED.    As part of my medical decision making, I reviewed the following data within the electronic MEDICAL RECORD NUMBER Nursing notes reviewed and incorporated, Old chart reviewed, Notes from prior ED visits, and [place] Controlled Substance Database  ____________________________________________   FINAL CLINICAL IMPRESSION(S) / ED DIAGNOSES  Final diagnoses:  Pain, dental      NEW MEDICATIONS STARTED DURING THIS  VISIT:  New Prescriptions   AMOXICILLIN (AMOXIL) 500 MG CAPSULE    Take 1 capsule (500 mg total) by mouth 3 (three) times daily.   IBUPROFEN (ADVIL) 800 MG TABLET    Take 1 tablet (800 mg total) by mouth every 8 (eight) hours as needed.   TRAMADOL (ULTRAM) 50 MG TABLET    Take 1 tablet (50 mg total) by mouth every 6 (six) hours as needed.     Note:  This document was prepared using Dragon voice recognition software and may include unintentional dictation errors.    Faythe Ghee, PA-C 08/24/20 1610    Georga Hacking, MD 08/24/20 [phone]

## 2020-08-24 NOTE — Discharge Instructions (Addendum)
OPTIONS FOR DENTAL FOLLOW UP CARE ° °[place] Department of Health and Human Services - Local Safety Net Dental Clinics °http://www.ncdhhs.gov/dph/oralhealth/services/safetynetclinics.htm °  °[facility] ([phone]) ° °[facility] ([phone]) ° °[facility] ([phone]) ° °[facility] ([phone]) ° °[facility] ([phone]) °This clinic caters to the indigent population and is on a lottery system. °Location: °[facility], [place] [facility], [address], [place] °Clinic Hours: °Wednesdays from 6pm - 9pm, patients seen by a lottery system. °For dates, call or go to [email or web address].[facility].edu/[facility]/patients/[email or web address]-[facility] °Services: °Cleanings, fillings and simple extractions. °Payment Options: °DENTAL WORK IS FREE OF CHARGE. Bring proof of income or support. °Best way to get seen: °Arrive at 5:15 pm - this is a lottery, NOT first come/first serve, so arriving earlier will not increase your chances of being seen. °  °  °[facility] °[phone] °Select option 1 for emergencies °  °Location: °[facility] [place], [address], [place] °Clinic Hours: °No walk-ins accepted - call the day before to schedule an appointment. °Check in times are 9:30 am and 1:30 pm. °Services: °Simple extractions, temporary fillings, pulpectomy/pulp debridement, uncomplicated abscess drainage. °Payment Options: °PAYMENT IS DUE AT THE TIME OF SERVICE.  Fee is usually $100-200, additional surgical procedures (e.g. abscess drainage) may be extra. °Cash, checks, Visa/MasterCard accepted.  Can file Medicaid if patient is covered for dental - patient should call case worker to check. °No discount for [facility] patients. °Best way to get seen: °MUST call the day before and get onto the schedule. Can usually be seen the next 1-2 days. No walk-ins accepted. °  °  °[facility] °[phone] °   °Location: °[facility], [address], [place] °Clinic Hours: °M, W, Th, F 8am or 1:30pm, Tues 9a or 1:30 - first come/first served. °Services: °Simple extractions, temporary fillings, uncomplicated abscess drainage.  You do not need to be an [place] resident. °Payment Options: °PAYMENT IS DUE AT THE TIME OF SERVICE. °Dental insurance, otherwise sliding scale - bring proof of income or support. °Depending on income and treatment needed, cost is usually $50-200. °Best way to get seen: °Arrive early as it is first come/first served. °  °  °[facility] °[phone] °  °Location: °[address] °Clinic Hours: °Mon-Thu 8a-5p °Services: °Most basic dental services including extractions and fillings. °Payment Options: °PAYMENT IS DUE AT THE TIME OF SERVICE. °Sliding scale, up to 50% off - bring proof if income or support. °Medicaid with dental option accepted. °Best way to get seen: °Call to schedule an appointment, can usually be seen within 2 weeks OR they will try to see walk-ins - show up at 8a or 2p (you may have to wait). °  °  °[facility] °[phone] °[place] RESIDENTS ONLY °  °Location: °[facility], [address], [place], [place] [address] °Clinic Hours: By appointment only. °Monday - Thursday 8am-5pm, Friday 8am-12pm °Services: Cleanings, fillings, extractions. °Payment Options: °PAYMENT IS DUE AT THE TIME OF SERVICE. °Cash, Visa or MasterCard. Sliding scale - $30 minimum per service. °Best way to get seen: °Come in to office, complete packet and make an appointment - need proof of income °or support monies for each household member and proof of [place] residence. °Usually takes about a month to get in. °  °  °[facility] °[phone] °  °Location: °[address]   [place] °Clinic Hours: Walk-in Urgent Care Dental Services are offered Monday-Friday  mornings only. °The numbers of emergencies accepted daily is limited to the number of °providers available. °Maximum 15 - Mondays, Wednesdays & Thursdays °Maximum 10 - Tuesdays & Fridays °Services: °You do not need to be a [place] resident to be seen for a dental emergency. °Emergencies are defined as pain, swelling, abnormal bleeding, or dental trauma. Walkins will receive x-rays if needed. °NOTE: Dental cleaning is not an emergency. °Payment Options: °PAYMENT IS DUE AT THE TIME OF SERVICE. °Minimum co-pay is $40.00 for uninsured patients. °Minimum co-pay is $3.00 for Medicaid with dental coverage. °Dental Insurance is accepted and must be presented at time of visit. °Medicare does not cover dental. °Forms of payment: Cash, credit card, checks. °Best way to get seen: °If not previously registered with the clinic, walk-in dental registration begins at 7:15 am and is on a first come/first serve basis. °If previously registered with the clinic, call to make an appointment. °  °  °[facility] °[phone] °[place] RESIDENTS ONLY °  °Location: °[address], [place], [place] °Clinic Hours: °Mon-Thu 10a-2p °Services: Extractions only! °Payment Options: °FREE (donations accepted) - bring proof of income or support °Best way to get seen: °Call and schedule an appointment OR come at 8am on the 1st Monday of every month (except for holidays) when it is first come/first served. °  °  °[facility] °[phone] °  °Location: °[address],  °Clinic Hours: °Friday mornings °Services, Payment Options, Best way to get seen: °Call for info °

## 2020-10-15 ENCOUNTER — Other Ambulatory Visit: Payer: Self-pay

## 2020-10-15 ENCOUNTER — Emergency Department
Admission: EM | Admit: 2020-10-15 | Discharge: 2020-10-15 | Disposition: A | Discharge status: Home / Self Care | Payer: Self-pay | Attending: Emergency Medicine | Admitting: Emergency Medicine

## 2020-10-15 ENCOUNTER — Encounter: Payer: Self-pay | Admitting: Emergency Medicine

## 2020-10-15 DIAGNOSIS — Z7982 Long term (current) use of aspirin: Secondary | ICD-10-CM | POA: Insufficient documentation

## 2020-10-15 DIAGNOSIS — K047 Periapical abscess without sinus: Secondary | ICD-10-CM | POA: Insufficient documentation

## 2020-10-15 DIAGNOSIS — F17210 Nicotine dependence, cigarettes, uncomplicated: Secondary | ICD-10-CM | POA: Insufficient documentation

## 2020-10-15 DIAGNOSIS — I10 Essential (primary) hypertension: Secondary | ICD-10-CM | POA: Insufficient documentation

## 2020-10-15 MED ORDER — TRAMADOL HCL 50 MG PO TABS
50.0000 mg | ORAL_TABLET | Freq: Once | ORAL | Status: AC
  Administered 2020-10-15: 50 mg via ORAL
  Filled 2020-10-15: qty 1

## 2020-10-15 MED ORDER — NAPROXEN 500 MG PO TABS
500.0000 mg | ORAL_TABLET | Freq: Two times a day (BID) | ORAL | 2 refills | Status: DC
  Filled 2020-10-15: qty 20, 10d supply, fill #0

## 2020-10-15 MED ORDER — PENICILLIN V POTASSIUM 500 MG PO TABS
500.0000 mg | ORAL_TABLET | Freq: Once | ORAL | Status: AC
  Administered 2020-10-15: 500 mg via ORAL
  Filled 2020-10-15: qty 1

## 2020-10-15 MED ORDER — PENICILLIN V POTASSIUM 250 MG PO TABS
250.0000 mg | ORAL_TABLET | Freq: Four times a day (QID) | ORAL | 0 refills | Status: DC
  Filled 2020-10-15: qty 28, 7d supply, fill #0

## 2020-10-15 NOTE — ED Triage Notes (Signed)
Patient ambulatory to triage with steady gait, without difficulty or distress noted; pt reports bilat earache x 2-46mos

## 2020-10-15 NOTE — ED Provider Notes (Signed)
[facility] Emergency Department Provider Note   ____________________________________________    I have reviewed the triage vital signs and the nursing notes.   HISTORY  Chief Complaint Otalgia     HPI Christopher Spencer is a 55 y.o. male who presents with complaints of dental pain which occasionally makes his ear hurt.  Patient describes right lower posterior dental pain, he reports this is been painful for months.  He thinks it may be getting worse.  He denies fevers or chills.  No throat swelling or difficulty swallowing.  No nausea or vomiting.  Has not take anything for this  Past Medical History:  Diagnosis Date   Hypertension     Patient Active Problem List   Diagnosis Date Noted   Seizure (HCC) 12/04/2017    Past Surgical History:  Procedure Laterality Date   CHOLECYSTECTOMY      Prior to Admission medications   Medication Sig Start Date End Date Taking? Authorizing Provider  naproxen (NAPROSYN) 500 MG tablet Take 1 tablet (500 mg total) by mouth 2 (two) times daily with a meal. 10/15/20  Yes Jene Every, MD  penicillin v potassium (VEETID) 250 MG tablet Take 1 tablet (250 mg total) by mouth 4 (four) times daily for 7 days. 10/15/20  Yes Jene Every, MD  amoxicillin (AMOXIL) 500 MG capsule Take 1 capsule (500 mg total) by mouth 3 (three) times daily. 08/24/20   Sherrie Mustache Roselyn Bering, PA-C  aspirin EC 81 MG tablet Take 1 tablet (81 mg total) by mouth daily. 12/05/17   Salary, Evelena Asa, MD  ibuprofen (ADVIL) 800 MG tablet Take 1 tablet (800 mg total) by mouth every 8 (eight) hours as needed. 08/24/20   Fisher, Roselyn Bering, PA-C  traMADol (ULTRAM) 50 MG tablet Take 1 tablet (50 mg total) by mouth every 6 (six) hours as needed. 08/24/20   Faythe Ghee, PA-C     Allergies Patient has no known allergies.  No family history on file.  Social History Social History   Tobacco Use   Smoking status: Every Day    Packs/day: 0.50    Types: Cigarettes    Smokeless tobacco: Never  Vaping Use   Vaping Use: Never used  Substance Use Topics   Alcohol use: No   Drug use: Yes    Types: Marijuana    Review of Systems  Constitutional: No fever/chills  ENT: as above      Skin: Negative for rash. Neurological: Negative for headaches     ____________________________________________   PHYSICAL EXAM:  VITAL SIGNS: ED Triage Vitals  Enc Vitals Group     BP 10/15/20 0623 (!) 154/86     Pulse Rate 10/15/20 0623 84     Resp 10/15/20 0623 20     Temp 10/15/20 0623 98.4 F (36.9 C)     Temp Source 10/15/20 0623 Oral     SpO2 10/15/20 0623 95 %     Weight 10/15/20 0628 91.2 kg (201 lb)     Height 10/15/20 0628 1.753 m (5\' 9" )     Head Circumference --      Peak Flow --      Pain Score 10/15/20 0628 8     Pain Loc --      Pain Edu? --      Excl. in GC? --      Constitutional: Alert and oriented. No acute distress. Pleasant and interactive Eyes: Conjunctivae are normal.  Head: Atraumatic. Nose: No congestion/rhinnorhea. Mouth/Throat:  Mucous membranes are moist.  Right lower posterior molar with surrounding erythema, no drainable collection, poor dentition overall, uvula, pharynx normal Cardiovascular: Normal rate, regular rhythm.  Respiratory: Normal respiratory effort.  No retractions.  Neurologic:  Normal speech and language. No gross focal neurologic deficits are appreciated.   Skin:  Skin is warm, dry and intact. No rash noted.   ____________________________________________   LABS (all labs ordered are listed, but only abnormal results are displayed)  Labs Reviewed - No data to display ____________________________________________  EKG   ____________________________________________  RADIOLOGY   ____________________________________________   PROCEDURES  Procedure(s) performed: No  Procedures   Critical Care performed: No ____________________________________________   INITIAL IMPRESSION /  ASSESSMENT AND PLAN / ED COURSE  Pertinent labs & imaging results that were available during my care of the patient were reviewed by me and considered in my medical decision making (see chart for details).   Patient well-appearing and in no acute distress exam is most consistent with infected tooth.  We will start the patient on antibiotics, outpatient dental referral recommended, return precautions discussed   ____________________________________________   FINAL CLINICAL IMPRESSION(S) / ED DIAGNOSES  Final diagnoses:  Dental infection      NEW MEDICATIONS STARTED DURING THIS VISIT:  Discharge Medication List as of 10/15/2020 10:03 AM     START taking these medications   Details  naproxen (NAPROSYN) 500 MG tablet Take 1 tablet (500 mg total) by mouth 2 (two) times daily with a meal., Starting Tue 10/15/2020, Normal    penicillin v potassium (VEETID) 250 MG tablet Take 1 tablet (250 mg total) by mouth 4 (four) times daily., Starting Tue 10/15/2020, Normal         Note:  This document was prepared using Dragon voice recognition software and may include unintentional dictation errors.    Jene Every, MD 10/15/20 1438

## 2021-05-21 ENCOUNTER — Emergency Department: Payer: Self-pay

## 2021-05-21 ENCOUNTER — Other Ambulatory Visit: Payer: Self-pay

## 2021-05-21 ENCOUNTER — Observation Stay
Admission: EM | Admit: 2021-05-21 | Discharge: 2021-05-22 | Disposition: A | Discharge status: Home / Self Care | Payer: Self-pay | Attending: Internal Medicine | Admitting: Internal Medicine

## 2021-05-21 ENCOUNTER — Encounter: Payer: Self-pay | Admitting: Emergency Medicine

## 2021-05-21 DIAGNOSIS — Z758 Other problems related to medical facilities and other health care: Secondary | ICD-10-CM | POA: Diagnosis present

## 2021-05-21 DIAGNOSIS — R079 Chest pain, unspecified: Principal | ICD-10-CM | POA: Diagnosis present

## 2021-05-21 DIAGNOSIS — E876 Hypokalemia: Secondary | ICD-10-CM | POA: Diagnosis present

## 2021-05-21 DIAGNOSIS — I10 Essential (primary) hypertension: Secondary | ICD-10-CM | POA: Diagnosis present

## 2021-05-21 DIAGNOSIS — F1410 Cocaine abuse, uncomplicated: Secondary | ICD-10-CM | POA: Diagnosis present

## 2021-05-21 DIAGNOSIS — Z7982 Long term (current) use of aspirin: Secondary | ICD-10-CM | POA: Insufficient documentation

## 2021-05-21 DIAGNOSIS — F17210 Nicotine dependence, cigarettes, uncomplicated: Secondary | ICD-10-CM | POA: Insufficient documentation

## 2021-05-21 LAB — TROPONIN I (HIGH SENSITIVITY): Troponin I (High Sensitivity): 31 ng/L — ABNORMAL HIGH (ref <18)

## 2021-05-21 LAB — BASIC METABOLIC PANEL
Anion gap: 8 (ref 5–15)
BUN: 20 mg/dL (ref 6–20)
CO2: 27 mmol/L (ref 22–32)
Calcium: 9.2 mg/dL (ref 8.9–10.3)
Chloride: 104 mmol/L (ref 98–111)
Creatinine, Ser: 1.62 mg/dL — ABNORMAL HIGH (ref 0.61–1.24)
GFR, Estimated: 50 mL/min — ABNORMAL LOW (ref >60)
Glucose, Bld: 182 mg/dL — ABNORMAL HIGH (ref 70–99)
Potassium: 3.2 mmol/L — ABNORMAL LOW (ref 3.5–5.1)
Sodium: 139 mmol/L (ref 135–145)

## 2021-05-21 MED ORDER — ACETAMINOPHEN 500 MG PO TABS
1000.0000 mg | ORAL_TABLET | Freq: Once | ORAL | Status: AC
  Administered 2021-05-21: 1000 mg via ORAL
  Filled 2021-05-21: qty 2

## 2021-05-21 MED ORDER — POTASSIUM CHLORIDE CRYS ER 20 MEQ PO TBCR
40.0000 meq | EXTENDED_RELEASE_TABLET | Freq: Once | ORAL | Status: AC
  Administered 2021-05-21: 40 meq via ORAL
  Filled 2021-05-21: qty 2

## 2021-05-21 MED ORDER — ENALAPRILAT 1.25 MG/ML IV SOLN
0.6250 mg | Freq: Once | INTRAVENOUS | Status: AC
  Administered 2021-05-21: 0.625 mg via INTRAVENOUS
  Filled 2021-05-21: qty 2

## 2021-05-21 MED ORDER — ASPIRIN 81 MG PO CHEW
324.0000 mg | CHEWABLE_TABLET | Freq: Once | ORAL | Status: AC
  Administered 2021-05-21: 324 mg via ORAL
  Filled 2021-05-21: qty 4

## 2021-05-21 NOTE — ED Provider Notes (Signed)
? ?[facility] ?Provider Note ? ? ? Event Date/Time  ? First MD Initiated Contact with Patient 05/21/21 2303   ?  (approximate) ? ? ?History  ? ?Chest Pain ? ? ?HPI ? ?Christopher Spencer is a 56 y.o. male  with pmh HTN, cocaine use disorder, seizures presents with headache and CP.  Symptoms started around 7 PM tonight.  [name] is a frontal headache that was not maximal in onset not associated visual change numbness tingling or weakness.  Headache has largely subsided.  Denies history of similar headache.  Patient also endorses midsternal chest pain that does not radiate was nonexertional lasted for about an hour and is now resolved.  Denies associated shortness of breath diaphoresis nausea.  Tells me he has had similar chest pain in setting of anxiety before denies known coronary disease history of heart problems.  Does have history of cocaine use disorder last used about a week ago.  Nothing recent.  Denies back or abdominal pain. ? ?  ? ?Past Medical History:  ?Diagnosis Date  ? Hypertension   ? ? ?Patient Active Problem List  ? Diagnosis Date Noted  ? Seizure (HCC) 12/04/2017  ? ? ? ?Physical Exam  ?Triage Vital Signs: ?ED Triage Vitals  ?Enc Vitals Group  ?   BP 05/21/21 2248 (!) 170/102  ?   Pulse Rate 05/21/21 2248 (!) 108  ?   Resp 05/21/21 2248 18  ?   Temp 05/21/21 2248 98.4 ?F (36.9 ?C)  ?   Temp Source 05/21/21 2248 Oral  ?   SpO2 05/21/21 2248 96 %  ?   Weight 05/21/21 2248 190 lb (86.2 kg)  ?   Height 05/21/21 2248 5\' 9"  (1.753 m)  ?   Head Circumference --   ?   Peak Flow --   ?   Pain Score 05/21/21 2254 6  ?   Pain Loc --   ?   Pain Edu? --   ?   Excl. in GC? --   ? ? ?Most recent vital signs: ?Vitals:  ? 05/21/21 2337 05/22/21 0013  ?BP: (!) 192/95 (!) 152/91  ?Pulse: 83 80  ?Resp: 16 14  ?Temp:    ?SpO2: 100% 99%  ? ? ? ?General: Awake, no distress.  ?CV:  Good peripheral perfusion.  No lower extremity edema ?Resp:  Normal effort.  Lungs are clear ?Abd:  No distention.   ?Neuro:             Awake, Alert, Oriented x 3  ?Other:   ? ? ?ED Results / Procedures / Treatments  ?Labs ?(all labs ordered are listed, but only abnormal results are displayed) ?Labs Reviewed  ?BASIC METABOLIC PANEL - Abnormal; Notable for the following components:  ?    Result Value  ? Potassium 3.2 (*)   ? Glucose, Bld 182 (*)   ? Creatinine, Ser 1.62 (*)   ? GFR, Estimated 50 (*)   ? All other components within normal limits  ?CBC - Abnormal; Notable for the following components:  ? Platelets 146 (*)   ? All other components within normal limits  ?TROPONIN I (HIGH SENSITIVITY) - Abnormal; Notable for the following components:  ? Troponin I (High Sensitivity) 31 (*)   ? All other components within normal limits  ?TROPONIN I (HIGH SENSITIVITY)  ? ? ? ?EKG ? ?EKG interpreted by myself shows normal sinus rhythm with a normal axis LVH with T wave inversions in V5 V6 1  aVL likely secondary to LVH, ST elevation in V2 V3, appears similar to prior EKG from 2019 ? ? ?RADIOLOGY ?I reviewed the CXR which does not show any acute cardiopulmonary process; agree with radiology report  ? ? ? ?PROCEDURES: ? ?Critical Care performed: No ? ?.1-3 Lead EKG Interpretation ?Performed by: Georga Hacking, MD ?Authorized by: Georga Hacking, MD  ? ?  Interpretation: normal   ?  ECG rate assessment: normal   ?  Ectopy: none   ?  Conduction: normal   ? ?The patient is on the cardiac monitor to evaluate for evidence of arrhythmia and/or significant heart rate changes. ? ? ?MEDICATIONS ORDERED IN ED: ?Medications  ?aspirin chewable tablet 324 mg (324 mg Oral Given 05/21/21 2324)  ?acetaminophen (TYLENOL) tablet 1,000 mg (1,000 mg Oral Given 05/21/21 2324)  ?enalaprilat (VASOTEC) injection 0.625 mg (0.625 mg Intravenous Given 05/21/21 2341)  ?potassium chloride SA (KLOR-CON M) CR tablet 40 mEq (40 mEq Oral Given 05/21/21 2337)  ? ? ? ?IMPRESSION / MDM / ASSESSMENT AND PLAN / ED COURSE  ?I reviewed the triage vital signs and the nursing  notes. ?             ?               ? ?Differential diagnosis includes, but is not limited to, ACS, musculoskeletal, less likely aortic dissection, pulmonary embolism ? ?Patient is a 56 year old male history of hypertension not treated and cocaine use disorder presents with headache and chest pain.  Started earlier tonight symptoms have largely resolved.  Chest pain was midsternal nonradiating without associated symptoms is now resolved.  Headache also frontal without associated neurologic symptoms and is also resolving.  He is hypertensive mildly tachycardic appears well on exam not in focal neurologic exam.  Patient's EKG is abnormal with ST elevation in leads V2 and V3 and T wave inversions V5 V6 1 and aVL however patient has significant LVH and this looks similar to EKG from 2019.  Given he is pain-free and EKG is largely unchanged do not feel need to activate Cath Lab. ? ?Repeat EKG is unchanged.  Troponin is 31.  I reached out to Dr. Gracelyn Nurse who is both on-call for cardiology and is the Attending.  Currently he is in a catheterization procedure and is unavailable.  I sent him a secure chat with the patient's information.  Patient is hypertensive will give IV enalaprilat.  He has a history of cocaine use disorder while he tells me that he did not use in the last 24 hours feel that he is somewhat unreliable so we will avoid beta-blockers.  We will continue to monitor. ? ? ?Dr. Beatrix Fetters reviewed pts EKG and agrees that this is most likely LVH with repol abnormality. BP is downtrending, will admit to hospitalist service for observation.  ?  ? ? ?FINAL CLINICAL IMPRESSION(S) / ED DIAGNOSES  ? ?Final diagnoses:  ?Chest pain, unspecified type  ? ? ? ?Rx / DC Orders  ? ?ED Discharge Orders   ? ? None  ? ?  ? ? ? ?Note:  This document was prepared using Dragon voice recognition software and may include unintentional dictation errors. ?  ?Georga Hacking, MD ?05/22/21 [phone] ? ?

## 2021-05-21 NOTE — ED Triage Notes (Signed)
Pt presents via POV with complaints of CP and Headache since yesterday. Pt describes the CP as pressure to the mid/left side of his chest. Hx of HTN but not currently on medication. Denies SOB.  ?

## 2021-05-22 ENCOUNTER — Encounter: Payer: Self-pay | Admitting: Internal Medicine

## 2021-05-22 DIAGNOSIS — Z758 Other problems related to medical facilities and other health care: Secondary | ICD-10-CM

## 2021-05-22 DIAGNOSIS — R079 Chest pain, unspecified: Principal | ICD-10-CM

## 2021-05-22 DIAGNOSIS — E876 Hypokalemia: Secondary | ICD-10-CM

## 2021-05-22 DIAGNOSIS — I10 Essential (primary) hypertension: Secondary | ICD-10-CM

## 2021-05-22 DIAGNOSIS — F1410 Cocaine abuse, uncomplicated: Secondary | ICD-10-CM

## 2021-05-22 LAB — CBC
HCT: 43.4 % (ref 39.0–52.0)
Hemoglobin: 14.2 g/dL (ref 13.0–17.0)
MCH: 30.6 pg (ref 26.0–34.0)
MCHC: 32.7 g/dL (ref 30.0–36.0)
MCV: 93.5 fL (ref 80.0–100.0)
Platelets: 146 10*3/uL — ABNORMAL LOW (ref 150–400)
RBC: 4.64 MIL/uL (ref 4.22–5.81)
RDW: 13.3 % (ref 11.5–15.5)
WBC: 6.3 10*3/uL (ref 4.0–10.5)
nRBC: 0 % (ref 0.0–0.2)

## 2021-05-22 LAB — URINE DRUG SCREEN, QUALITATIVE (ARMC ONLY)
Amphetamines, Ur Screen: NOT DETECTED
Barbiturates, Ur Screen: NOT DETECTED
Benzodiazepine, Ur Scrn: NOT DETECTED
Cannabinoid 50 Ng, Ur ~~LOC~~: POSITIVE — AB
Cocaine Metabolite,Ur ~~LOC~~: POSITIVE — AB
MDMA (Ecstasy)Ur Screen: NOT DETECTED
Methadone Scn, Ur: NOT DETECTED
Opiate, Ur Screen: NOT DETECTED
Phencyclidine (PCP) Ur S: NOT DETECTED
Tricyclic, Ur Screen: NOT DETECTED

## 2021-05-22 LAB — HIV ANTIBODY (ROUTINE TESTING W REFLEX): HIV Screen 4th Generation wRfx: NONREACTIVE

## 2021-05-22 LAB — TROPONIN I (HIGH SENSITIVITY): Troponin I (High Sensitivity): 37 ng/L — ABNORMAL HIGH (ref <18)

## 2021-05-22 LAB — MAGNESIUM: Magnesium: 2 mg/dL (ref 1.7–2.4)

## 2021-05-22 MED ORDER — ACETAMINOPHEN 325 MG PO TABS: 650.0000 mg | ORAL_TABLET | ORAL | Status: DC | PRN

## 2021-05-22 MED ORDER — ENALAPRILAT 1.25 MG/ML IV SOLN
0.6250 mg | Freq: Four times a day (QID) | INTRAVENOUS | Status: DC | PRN
  Administered 2021-05-22: 0.625 mg via INTRAVENOUS
  Filled 2021-05-22: qty 2

## 2021-05-22 MED ORDER — NITROGLYCERIN 2 % TD OINT: 1.0000 [in_us] | TOPICAL_OINTMENT | Freq: Four times a day (QID) | TRANSDERMAL | Status: DC | PRN

## 2021-05-22 MED ORDER — ASPIRIN EC 81 MG PO TBEC: 81.0000 mg | DELAYED_RELEASE_TABLET | Freq: Every day | ORAL | Status: DC

## 2021-05-22 MED ORDER — TRAMADOL HCL 50 MG PO TABS: 50.0000 mg | ORAL_TABLET | Freq: Four times a day (QID) | ORAL | Status: DC | PRN

## 2021-05-22 MED ORDER — ONDANSETRON HCL 4 MG/2ML IJ SOLN: 4.0000 mg | Freq: Four times a day (QID) | INTRAMUSCULAR | Status: DC | PRN

## 2021-05-22 MED ORDER — MELATONIN 5 MG PO TABS: 5.0000 mg | ORAL_TABLET | Freq: Every evening | ORAL | Status: DC | PRN

## 2021-05-22 MED ORDER — ENOXAPARIN SODIUM 40 MG/0.4ML IJ SOSY: 40.0000 mg | PREFILLED_SYRINGE | INTRAMUSCULAR | Status: DC

## 2021-05-22 NOTE — Assessment & Plan Note (Signed)
-   Status post potassium chloride 40 mill equivalent per EDP ?- Check magnesium ?- BMP in a.m. ?

## 2021-05-22 NOTE — Assessment & Plan Note (Addendum)
-   No chest pain at bedside ?- I suspect this is secondary to vasoconstriction secondary to cocaine ?- Nitroglycerin 1 inch every 6 hours as needed for chest pain ?- status post aspirin 324 mg p.o. one-time dose ?- Second high sensitive troponin is in process ?- Resumed home aspirin 81 mg daily ?- Complete echo ordered ?- A.m. team to consult cardiology and/or referral to cardiology outpatient pending clinical judgment ?- Heparin GTT has been deferred at this time given patient is not having chest pain and I am still pending a second troponin ?- I would not order heparin GTT unless patient is having chest pain and a positive delta and/or new EKG changes ?- Admit to telemetry cardiac, observation ?

## 2021-05-22 NOTE — Assessment & Plan Note (Addendum)
-   Patient endorses he last used over 24 hours ago ?- UDS ordered ?- Avoid beta-blockade at this time ?- Counseled patient extensively on cocaine cessation ?

## 2021-05-22 NOTE — Discharge Summary (Signed)
Physician Discharge Summary  ?Christopher Spencer MVE:[id number] DOB: [birth year] DOA: 05/21/2021 ? ?PCP: Patient, No Pcp Per (Inactive) ? ?Admit date: 05/21/2021 ?Discharge date: 05/22/2021 ? ?Admitted From: Home ?Disposition: Home ? ?Recommendations for Outpatient Follow-up:  ?Follow up with PCP in 1-2 weeks ? ? ?Home Health: No ?Equipment/Devices: None ? ?Discharge Condition: Stable ?CODE STATUS: Full ?Diet recommendation: Heart healthy ? ?Brief/Interim Summary: ?56 year old male with history of cocaine abuse, hypertension, and currently not on antihypertensive medications, chronic pain, who presents emergency department for chief concerns of chest pain. ? ?Initial EKG was concerning for ST elevation.  EDP discussed with STEMI cardiologist.  Cardiologist confirms no STEMI and ST elevation likely secondary to LVH.  Patient remained stable during hospital stay.  Telemetry reassuring.  No signs of ST changes.  Troponins flat with no significant delta. ? ?At time of discharge patient is chest pain-free.  Ambulating without difficulty.  Strongly suspect that cocaine induced coronary vasospasm is underlying the patient's issues.  As he is chest pain-free will discharge home with strong recommendation to immediately discontinue cocaine use ? ? ? ?Discharge Diagnoses:  ?Principal Problem: ?  Chest pain ?Active Problems: ?  Cocaine abuse (HCC) ?  Essential hypertension ?  Hypokalemia ?  Does not have primary care provider ? ?Chest pain ?Resolved at time of discharge.  Suspect coronary vasospasm secondary to cocaine use.  Okay for discharge home.  Can defer echocardiogram for now.  Troponins are reassuring.  Telemetry reassuring.  Counseled on cocaine cessation ? ?Discharge Instructions ? ?Discharge Instructions   ? ? Increase activity slowly   Complete by: As directed ?  ? Increase activity slowly   Complete by: As directed ?  ? ?  ? ?Allergies as of 05/22/2021   ?No Known Allergies ?  ? ?  ?Medication List  ?  ? ?STOP taking these  medications   ? ?amoxicillin 500 MG capsule ?Commonly known as: AMOXIL ?  ?naproxen 500 MG tablet ?Commonly known as: Naprosyn ?  ?penicillin v potassium 250 MG tablet ?Commonly known as: VEETID ?  ? ?  ? ?TAKE these medications   ? ?aspirin EC 81 MG tablet ?Take 1 tablet (81 mg total) by mouth daily. ?  ?ibuprofen 800 MG tablet ?Commonly known as: ADVIL ?Take 1 tablet (800 mg total) by mouth every 8 (eight) hours as needed. ?  ?traMADol 50 MG tablet ?Commonly known as: ULTRAM ?Take 1 tablet (50 mg total) by mouth every 6 (six) hours as needed. ?  ? ?  ? ? ?No Known Allergies ? ?Consultations: ?None ? ? ?Procedures/Studies: ?DG Chest Port 1 View ? ?Result Date: 05/21/2021 ?CLINICAL DATA:  Chest pain and headache. EXAM: PORTABLE CHEST 1 VIEW COMPARISON:  CTA chest 02/17/2018 FINDINGS: The heart size and mediastinal contours are within normal limits apart from slight aortic tortuosity. There is mild calcification in the aortic arch. Both lungs are clear. The visualized skeletal structures are intact with moderate thoracic spondylosis. IMPRESSION: No active disease.  Aortic atherosclerosis.  Stable chest. Electronically Signed   By: Almira Bar M.D.   On: 05/21/2021 23:17   ? ? ? ?Subjective: ?Seen and examined at time of discharge.  Stable no distress.  Chest pain-free ? ?Discharge Exam: ?Vitals:  ? 05/22/21 0700 05/22/21 0900  ?BP: (!) 158/86 (!) 149/87  ?Pulse: 65 72  ?Resp: 16   ?Temp:    ?SpO2: 100% 100%  ? ?Vitals:  ? 05/22/21 0500 05/22/21 0600 05/22/21 0700 05/22/21 0900  ?BP: (!) 161/79 Marland Kitchen)  173/88 (!) 158/86 (!) 149/87  ?Pulse: 67 68 65 72  ?Resp: 18 18 16    ?Temp:      ?TempSrc:      ?SpO2: 100% 100% 100% 100%  ?Weight:      ?Height:      ? ? ?General: Pt is alert, awake, not in acute distress ?Cardiovascular: RRR, S1/S2 +, no rubs, no gallops ?Respiratory: CTA bilaterally, no wheezing, no rhonchi ?Abdominal: Soft, NT, ND, bowel sounds + ?Extremities: no edema, no cyanosis ? ? ? ?The results of significant  diagnostics from this hospitalization (including imaging, microbiology, ancillary and laboratory) are listed below for reference.   ? ? ?Microbiology: ?No results found for this or any previous visit (from the past 240 hour(s)).  ? ?Labs: ?BNP (last 3 results) ?No results for input(s): BNP in the last 8760 hours. ?Basic Metabolic Panel: ?Recent Labs  ?Lab 05/21/21 ?2249 05/22/21 ?0123  ?NA 139  --   ?K 3.2*  --   ?CL 104  --   ?CO2 27  --   ?GLUCOSE 182*  --   ?BUN 20  --   ?CREATININE 1.62*  --   ?CALCIUM 9.2  --   ?MG  --  2.0  ? ?Liver Function Tests: ?No results for input(s): AST, ALT, ALKPHOS, BILITOT, PROT, ALBUMIN in the last 168 hours. ?No results for input(s): LIPASE, AMYLASE in the last 168 hours. ?No results for input(s): AMMONIA in the last 168 hours. ?CBC: ?Recent Labs  ?Lab 05/21/21 ?2249  ?WBC 6.3  ?HGB 14.2  ?HCT 43.4  ?MCV 93.5  ?PLT 146*  ? ?Cardiac Enzymes: ?No results for input(s): CKTOTAL, CKMB, CKMBINDEX, TROPONINI in the last 168 hours. ?BNP: ?Invalid input(s): POCBNP ?CBG: ?No results for input(s): GLUCAP in the last 168 hours. ?D-Dimer ?No results for input(s): DDIMER in the last 72 hours. ?Hgb A1c ?No results for input(s): HGBA1C in the last 72 hours. ?Lipid Profile ?No results for input(s): CHOL, HDL, LDLCALC, TRIG, CHOLHDL, LDLDIRECT in the last 72 hours. ?Thyroid function studies ?No results for input(s): TSH, T4TOTAL, T3FREE, THYROIDAB in the last 72 hours. ? ?Invalid input(s): FREET3 ?Anemia work up ?No results for input(s): VITAMINB12, FOLATE, FERRITIN, TIBC, IRON, RETICCTPCT in the last 72 hours. ?Urinalysis ?No results found for: COLORURINE, APPEARANCEUR, LABSPEC, PHURINE, GLUCOSEU, HGBUR, BILIRUBINUR, KETONESUR, PROTEINUR, UROBILINOGEN, NITRITE, LEUKOCYTESUR ?Sepsis Labs ?Invalid input(s): PROCALCITONIN,  WBC,  LACTICIDVEN ?Microbiology ?No results found for this or any previous visit (from the past 240 hour(s)). ? ? ?Time coordinating discharge: Over 30  minutes ? ?SIGNED: ? ? ?2250, MD  ?[facility] ?05/22/2021, 11:24 AM ?Pager  ? ?If 7PM-7AM, please contact night-coverage ? ?

## 2021-05-22 NOTE — Hospital Course (Signed)
[name] is a 56 year old male with history of cocaine abuse, hypertension, and currently not on antihypertensive medications, chronic pain, who presents emergency department for chief concerns of chest pain. ? ?Initial vitals in the emergency department showed temperature of 98.4, respiration rate of 18, heart rate of 108, blood pressure 170/102, SPO2 of 96% on room air. ? ?Serum sodium 139, potassium 3.2, chloride of 104, bicarb 27, BUN of 20, serum creatinine of 1.62, GFR 50, nonfasting blood glucose 182, WBC 6.3, platelets 146, hemoglobin 14.2. ? ?High sensitive troponin was 31. ? ?ED treatment: Aspirin 324 mg IV, Vasotec 0.625 mg IV, potassium chloride 40 mill equivalent, Tylenol 1000 mg. ? ?EDP discussed with STEMI cardiologist due to possible ST elevation in anterior leads, per EDP STEMI cardiologist states there is no STEMI and no ST elevation and is mostly LVH. ?

## 2021-05-22 NOTE — Assessment & Plan Note (Signed)
-   Patient is currently not taking any home antihypertensive ?

## 2021-05-22 NOTE — H&P (Addendum)
?History and Physical  ? ?Christopher Spencer [id number] DOB: [birth year] DOA: 05/21/2021 ? ?PCP: Patient, No Pcp Per (Inactive)  ?Patient coming from: Home via Christopher Spencer ? ?I have personally briefly reviewed patient's old medical records in [place]. ? ?Chief Concern: Chest pain and headache ? ?HPI: [name] is a 56 year old male with history of cocaine abuse, hypertension, and currently not on antihypertensive medications, chronic pain, who presents emergency department for chief concerns of chest pain. ? ?Initial vitals in the emergency department showed temperature of 98.4, respiration rate of 18, heart rate of 108, blood pressure 170/102, SPO2 of 96% on room air. ? ?Serum sodium 139, potassium 3.2, chloride of 104, bicarb 27, BUN of 20, serum creatinine of 1.62, GFR 50, nonfasting blood glucose 182, WBC 6.3, platelets 146, hemoglobin 14.2. ? ?High sensitive troponin was 31. ? ?ED treatment: Aspirin 324 mg IV, Vasotec 0.625 mg IV, potassium chloride 40 mill equivalent, Tylenol 1000 mg. ? ?EDP discussed with STEMI cardiologist due to possible ST elevation in anterior leads, per EDP STEMI cardiologist states there is no STEMI and no ST elevation and is mostly LVH. ? ?At bedside he is able to tell me his name, age, current location, and he knows he is in the hospital. ? ?He reports that he was taking a bath when he started having chest pressure and at its peak was a 6 out of 10.  This lasted approximately 1 hour.  He does not know what makes it go away.  He denies jaw and extremity radiation or neck discomfort.  He denies nausea, vomiting, shortness of breath, abdominal pain, dysuria, hematuria, diarrhea, swelling in his legs. ? ?He reports he never felt this way before. ? ?At bedside on my evaluation he denies any chest pain. ? ?He reports that he last used cocaine at a party, he thinks it was Tuesday, 05/20/2021. ?He reports he uses it increased frequently. ? ?He reports he has not taken his blood pressure  medication because he does not have a primary care doctor. ? ?Social history: He smokes 5 to 6 cigarettes/day.  He denies EtOH use.  He endorses cocaine use.  He works a Museum/gallery exhibitions officer. ? ?ROS: ?Constitutional: no weight change, no fever ?ENT/Mouth: no sore throat, no rhinorrhea ?Eyes: no eye pain, no vision changes ?Cardiovascular: + chest pain, no dyspnea,  no edema, no palpitations ?Respiratory: no cough, no sputum, no wheezing ?Gastrointestinal: no nausea, no vomiting, no diarrhea, no constipation ?Genitourinary: no urinary incontinence, no dysuria, no hematuria ?Musculoskeletal: no arthralgias, no myalgias ?Skin: no skin lesions, no pruritus, ?Neuro: no weakness, no loss of consciousness, no syncope ?Psych: no anxiety, no depression, no decrease appetite ?Heme/Lymph: no bruising, no bleeding ? ?ED Course: Discussed with emergency medicine provider, patient requiring hospitalization for chief concerns of chest pain with elevated high sensitive troponin. ? ?Assessment/Plan ? ?Principal Problem: ?  Chest pain ?Active Problems: ?  Cocaine abuse ([place]) ?  Essential hypertension ?  Hypokalemia ?  Does not have primary care provider ?  ?Assessment and Plan: ? ?* Chest pain ?- No chest pain at bedside ?- I suspect this is secondary to vasoconstriction secondary to cocaine ?- Nitroglycerin 1 inch every 6 hours as needed for chest pain ?- status post aspirin 324 mg p.o. one-time dose ?- Second high sensitive troponin is in process ?- Resumed home aspirin 81 mg daily ?- Complete echo ordered ?- A.m. team to consult cardiology and/or referral to cardiology outpatient pending clinical judgment ?- Heparin  GTT has been deferred at this time given patient is not having chest pain and I am still pending a second troponin ?- I would not order heparin GTT unless patient is having chest pain and a positive delta and/or new EKG changes ?- Admit to telemetry cardiac, observation ? ?Does not have primary care provider ?- TOC  consulted for PCP needs ? ?Hypokalemia ?- Status post potassium chloride 40 mill equivalent per EDP ?- Check magnesium ?- BMP in a.m. ? ?Essential hypertension ?- Patient is currently not taking any home antihypertensive ? ?Cocaine abuse ([place]) ?- Patient endorses he last used over 24 hours ago ?- UDS ordered ?- Avoid beta-blockade at this time ?- Counseled patient extensively on cocaine cessation ? ?AM team to complete med reconciliation ? ?Chart reviewed.  ? ?DVT prophylaxis: Enoxaparin ?Code Status: Full code ?Diet: N.p.o. ?Family Communication: No ?Disposition Plan: Pending clinical course ?Consults called: None at this time ?Admission status: Telemetry cardiac, observation ? ?Past Medical History:  ?Diagnosis Date  ? Hypertension   ? ?Past Surgical History:  ?Procedure Laterality Date  ? CHOLECYSTECTOMY    ? ?Social History:  reports that he has been smoking cigarettes. He has been smoking an average of .5 packs per day. He has never used smokeless tobacco. He reports current drug use. Drugs: Marijuana and Cocaine. He reports that he does not drink alcohol. ? ?No Known Allergies ?Family History  ?Problem Relation Age of Onset  ? Diabetes Father   ? Heart disease Sister   ? ?Family history: Family history reviewed and not pertinent ? ?Prior to Admission medications   ?Medication Sig Start Date End Date Taking? Authorizing Provider  ?amoxicillin (AMOXIL) 500 MG capsule Take 1 capsule (500 mg total) by mouth 3 (three) times daily. 08/24/20   Versie Starks, PA-C  ?aspirin EC 81 MG tablet Take 1 tablet (81 mg total) by mouth daily. 12/05/17   Salary, Avel Peace, MD  ?ibuprofen (ADVIL) 800 MG tablet Take 1 tablet (800 mg total) by mouth every 8 (eight) hours as needed. 08/24/20   Versie Starks, PA-C  ?naproxen (NAPROSYN) 500 MG tablet Take 1 tablet (500 mg total) by mouth 2 (two) times daily with a meal. 10/15/20   Lavonia Drafts, MD  ?penicillin v potassium (VEETID) 250 MG tablet Take 1 tablet (250 mg total) by mouth 4  (four) times daily for 7 days. 10/15/20   Lavonia Drafts, MD  ?traMADol (ULTRAM) 50 MG tablet Take 1 tablet (50 mg total) by mouth every 6 (six) hours as needed. 08/24/20   Versie Starks, PA-C  ? ?Physical Exam: ?Vitals:  ? 05/21/21 2248 05/21/21 2337 05/22/21 0013  ?BP: (!) 170/102 (!) 192/95 (!) 152/91  ?Pulse: (!) 108 83 80  ?Resp: 18 16 14   ?Temp: 98.4 ?F (36.9 ?C)    ?TempSrc: Oral    ?SpO2: 96% 100% 99%  ?Weight: 86.2 kg    ?Height: 5\' 9"  (1.753 m)    ? ?Constitutional: appears age-appropriate, NAD, calm, comfortable ?Eyes: PERRL, lids and conjunctivae normal ?ENMT: Mucous membranes are moist. Posterior pharynx clear of any exudate or lesions. Age-appropriate dentition. Hearing appropriate ?Neck: normal, supple, no masses, no thyromegaly ?Respiratory: clear to auscultation bilaterally, no wheezing, no crackles. Normal respiratory effort. No accessory muscle use.  ?Cardiovascular: Regular rate and rhythm, no murmurs / rubs / gallops. No extremity edema. 2+ pedal pulses. No carotid bruits.  ?Abdomen: no tenderness, no masses palpated, no hepatosplenomegaly. Bowel sounds positive.  ?Musculoskeletal: no clubbing / cyanosis. No  joint deformity upper and lower extremities. Good ROM, no contractures, no atrophy. Normal muscle tone.  ?Skin: no rashes, lesions, ulcers. No induration ?Neurologic: Sensation intact. Strength 5/5 in all 4.  ?Psychiatric: Normal judgment and insight. Alert and oriented x 3. Normal mood.  ? ?EKG: independently reviewed, showing sinus rhythm with rate91, QTc 472, LVH ? ?Chest x-ray on Admission: I personally reviewed and I agree with radiologist reading as below. ? ?DG Chest Port 1 View ? ?Result Date: 05/21/2021 ?CLINICAL DATA:  Chest pain and headache. EXAM: PORTABLE CHEST 1 VIEW COMPARISON:  CTA chest 02/17/2018 FINDINGS: The heart size and mediastinal contours are within normal limits apart from slight aortic tortuosity. There is mild calcification in the aortic arch. Both lungs are clear.  The visualized skeletal structures are intact with moderate thoracic spondylosis. IMPRESSION: No active disease.  Aortic atherosclerosis.  Stable chest. Electronically Signed   By: Telford Nab M.D.   On: 05

## 2021-05-22 NOTE — Assessment & Plan Note (Signed)
-   TOC consulted for PCP needs ?

## 2021-12-10 ENCOUNTER — Telehealth: Payer: Self-pay

## 2021-12-10 NOTE — Telephone Encounter (Addendum)
Phone call to pt at [phone]. Pt confirmed identity. States he was already informed by [facility] that he had + syphilis result. Counsled pt that [facility] would like to schedule appt with provider for evaluation and additional testing, and we can also tx if indicated. Pt expressed understanding.  Provider appt scheduled for 12/15/21.

## 2021-12-10 NOTE — Telephone Encounter (Signed)
Discussed with [facility], Shawn Stall. [facility] does not have any records. Make appt with [facility] provider. FAX to [facility].

## 2021-12-10 NOTE — Telephone Encounter (Signed)
[facility] received reportable disease information re syphilis testing from [facility] in [place]: Specimen Date= 11/26/21 Specimen Source= [facility] Type of Test= Syphilis Result= Positive (No titer, No confirmatory/TP)  Call [facility], any past hx in their records? and inform of report. (Person not found in [place] [facility].) No prior syphilis labs found in [employer].  Call pt, needs evaluation and additional testing.

## 2021-12-10 NOTE — Telephone Encounter (Signed)
Call pt to schedule pt with [facility] provider

## 2021-12-15 ENCOUNTER — Ambulatory Visit: Payer: Self-pay | Admitting: Nurse Practitioner

## 2021-12-15 ENCOUNTER — Encounter: Payer: Self-pay | Admitting: Nurse Practitioner

## 2021-12-15 DIAGNOSIS — Z3009 Encounter for other general counseling and advice on contraception: Secondary | ICD-10-CM | POA: Diagnosis not present

## 2021-12-15 DIAGNOSIS — A539 Syphilis, unspecified: Secondary | ICD-10-CM

## 2021-12-15 DIAGNOSIS — Z113 Encounter for screening for infections with a predominantly sexual mode of transmission: Secondary | ICD-10-CM

## 2021-12-15 DIAGNOSIS — Z0389 Encounter for observation for other suspected diseases and conditions ruled out: Secondary | ICD-10-CM | POA: Diagnosis not present

## 2021-12-15 DIAGNOSIS — Z1388 Encounter for screening for disorder due to exposure to contaminants: Secondary | ICD-10-CM | POA: Diagnosis not present

## 2021-12-15 LAB — HEPATITIS B SURFACE ANTIGEN: Hepatitis B Surface Ag: NONREACTIVE

## 2021-12-15 LAB — HM HEPATITIS C SCREENING LAB: HM Hepatitis Screen: NEGATIVE

## 2021-12-15 LAB — HM HIV SCREENING LAB: HM HIV Screening: NEGATIVE

## 2021-12-15 MED ORDER — PENICILLIN G BENZATHINE 1200000 UNIT/2ML IM SUSY
2.4000 10*6.[IU] | PREFILLED_SYRINGE | Freq: Once | INTRAMUSCULAR | Status: AC
  Administered 2021-12-15: 2.4 10*6.[IU] via INTRAMUSCULAR

## 2021-12-15 NOTE — Progress Notes (Unsigned)
Pt appointment for STI treatment. Seen by FNP White. Bicillin administered in bilateral ventro gluteal muscles. Instructions given regarding medication.

## 2021-12-15 NOTE — Progress Notes (Unsigned)
[facility] Department STI clinic/screening visit  Subjective:  Christopher Spencer is a 56 y.o. male being seen today for an STI screening visit. The patient reports they do have symptoms.    Patient has the following medical conditions:   Patient Active Problem List   Diagnosis Date Noted   Chest pain 05/22/2021   Cocaine abuse ([facility]) 05/22/2021   Essential hypertension 05/22/2021   Hypokalemia 05/22/2021   Does not have primary care provider 05/22/2021   Seizure Christopher Spencer) 12/04/2017     Chief Complaint  Patient presents with   SEXUALLY TRANSMITTED DISEASE    Screening patient was [facility] by the [facility] for syphilis     HPI  Patient reports to clinic today for positive Syphilis follow up.  Patient [facility] to the [facility] center on 12/06/21 to donate blood when he was notified that he had a positive RPR result.  Patient also reports a rash to the palm of his hands and feet that have been present for a couple of months   Last HIV test per patient/review of record was:  Lab Results  Component Value Date   HIV Non Reactive 05/22/2021    Does the patient or their partner desires a pregnancy in the next year? No  Screening for MPX risk: Does the patient have an unexplained rash? Yes Is the patient MSM? No Does the patient endorse multiple sex partners or anonymous sex partners? Yes Did the patient have close or sexual contact with a person diagnosed with MPX? No Has the patient traveled outside the [place] where MPX is endemic? No Is there a high clinical suspicion for MPX-- evidenced by one of the following No  -Unlikely to be chickenpox  -Lymphadenopathy  -Rash that present in same phase of evolution on any given body part   See flowsheet for further details and programmatic requirements.    There is no immunization history on file for this patient.   The following portions of the patient's history were reviewed and updated as appropriate: allergies, current  medications, past medical history, past social history, past surgical history and problem list.  Objective:  There were no vitals filed for this visit.  Physical Exam Constitutional:      Appearance: Normal appearance.  HENT:     Head: Normocephalic. No abrasion, masses or laceration. Hair is normal.     Right Ear: External ear normal.     Left Ear: External ear normal.     Nose: Nose normal.     Mouth/Throat:     Lips: Pink.     Mouth: Mucous membranes are moist. No oral lesions.     Pharynx: No pharyngeal swelling, oropharyngeal exudate, posterior oropharyngeal erythema or uvula swelling.     Tonsils: No tonsillar exudate or tonsillar abscesses.     Comments: Poor dentition  Eyes:     General: Lids are normal.        Right eye: No discharge.        Left eye: No discharge.     Conjunctiva/sclera: Conjunctivae normal.     Right eye: No exudate.    Left eye: No exudate. Abdominal:     General: Abdomen is flat.     Palpations: Abdomen is soft.     Tenderness: There is no abdominal tenderness. There is no rebound.  Genitourinary:    Pubic Area: No rash or pubic lice.      Penis: Normal and circumcised. No erythema or discharge.  Testes: Normal.        Right: Mass or tenderness not present.        Left: Mass or tenderness not present.     Rectum: Normal.     Comments: Discharge amount: none Color: none  Musculoskeletal:     Cervical back: Full passive range of motion without pain, normal range of motion and neck supple.  Lymphadenopathy:     Cervical: No cervical adenopathy.     Right cervical: No superficial, deep or posterior cervical adenopathy.    Left cervical: No superficial, deep or posterior cervical adenopathy.     Upper Body:     Right upper body: No supraclavicular, axillary or epitrochlear adenopathy.     Left upper body: No supraclavicular, axillary or epitrochlear adenopathy.     Lower Body: No right inguinal adenopathy. No left inguinal adenopathy.   Skin:    General: Skin is warm and dry.     Findings: No lesion or rash.     Comments: Hyperpigmented circular macules to palms of feet and hands   Neurological:     Mental Status: He is alert and oriented to person, place, and time.  Psychiatric:        Attention and Perception: Attention normal.        Mood and Affect: Mood normal.        Speech: Speech normal.        Behavior: Behavior normal. Behavior is cooperative.       Assessment and Plan:  Christopher Spencer is a 56 y.o. male presenting to the [facility] Department for STI screening  1. Screening examination for venereal disease -56 year old male in clinic today for STD screening and treatment for Syphilis.   -Patient does have STI symptoms Patient accepted all screenings including urine CT/GC and bloodwork for HIV/RPR.  Patient meets criteria for HepB screening? Yes. Ordered? Yes Patient meets criteria for HepC screening? Yes. Ordered? Yes Recommended condom use with all sex Discussed importance of condom use for STI prevent   Discussed time line for State Lab results and that patient will be called with positive results and encouraged patient to call if he had not heard in 2 weeks Recommended returning for continued or worsening symptoms.    - HIV/HCV [place] [facility] - Syphilis Serology, [place] [facility] - HBV Antigen/Antibody State Lab - Chlamydia/GC NAA, Confirmation  2. Syphilis -Treat patient today for Syphilis with Bicillin 2.4 million units x 1 dose.  Advised patient to abstain for sex for 14 days.  Patient to repeat blood work follow up in 6 months.    - penicillin g benzathine (BICILLIN LA) 1200000 UNIT/2ML injection 2.4 Million Units    Total time spent: 30 minutes   Return in about 6 months (around 06/15/2022).    Glenna Fellows, FNP

## 2021-12-16 ENCOUNTER — Encounter: Payer: Self-pay | Admitting: Nurse Practitioner

## 2021-12-16 NOTE — Telephone Encounter (Signed)
Pt seen by [facility] provider on 12/15/21.  Pt reported rash to palm of hands and feet that has been present for a couple of months. In Exam portion, provider documented: Hyperpigmented cirular malculaes to palms of feet and hands. Syphilis testing pending.  Bicillin LA 2.4 MU administered during visit.

## 2021-12-19 ENCOUNTER — Telehealth: Payer: Self-pay

## 2021-12-19 LAB — CHLAMYDIA/GC NAA, CONFIRMATION
Chlamydia trachomatis, NAA: POSITIVE — AB
Neisseria gonorrhoeae, NAA: NEGATIVE

## 2021-12-19 LAB — C. TRACHOMATIS NAA, CONFIRM: C. trachomatis NAA, Confirm: POSITIVE — AB

## 2021-12-19 NOTE — Telephone Encounter (Signed)
Calling pt re positive chlamydia result from 12/15/21 urine specimen. Pt needs tx appt.  Phone call to [phone]. Pt confirmed password. Counseled re +CT result. Pt states he can come for tx on Tuesday. States NKA.  Tx appt scheduled for 12/23/21 (10 min of appt are overbooked).

## 2021-12-24 ENCOUNTER — Telehealth: Payer: Self-pay

## 2021-12-24 NOTE — Telephone Encounter (Signed)
Calling pt re syphilis result from 12/15/21 specimen: RPR= Reactive Titer = 1:128 TP = Reactive  [facility] provider to review result.   Bicillin 2.4 MU administered during 12/15/21 visit for syphilis.  (Referred due to + syphilis at [facility]; pt also c/o rash and macules to feet and hands observed.)

## 2021-12-25 NOTE — Telephone Encounter (Signed)
Faxed to [facility].

## 2021-12-25 NOTE — Telephone Encounter (Addendum)
Discussed TR with [facility] provider. Per verbal by Glenna Fellows, FNP no further treatment is needed at this time, treatment was completed on 12/15/21 (Bicillin x1), and pt needs to f/u in 6 months.  Also, Still needs CT tx. See CT result and phone note.  Phone call to pt at [phone] (to provide TR info and orders by provider). Left voicemail that RN with [facility] is calling to provide TR and information; please call Elexius Minar at [phone].  MyChart pending.

## 2021-12-25 NOTE — Telephone Encounter (Signed)
Phone call to pt at [phone]. Left voicemail that RN with [facility] is calling to provide TR and information; please call Isley Zinni at [phone].  MyChart pending

## 2021-12-29 NOTE — Telephone Encounter (Signed)
Phone call to pt at [phone]. RN with [facility] is calling about a couple of different test results. Please call Sidi Dzikowski at [phone].

## 2021-12-29 NOTE — Telephone Encounter (Signed)
Phone call to pt at [phone]. RN with [facility] is calling about a couple of different test results. Please call Amil Moseman at [phone].  

## 2021-12-31 NOTE — Telephone Encounter (Signed)
Phone call to pt at [phone]. RN with [facility] is calling about a couple of different test results. Please call Kieana Livesay at [phone].  

## 2021-12-31 NOTE — Telephone Encounter (Signed)
Phone call to pt at [phone]. RN with [facility] is calling about a couple of different test results. Please call Cherokee Clowers at [phone].  

## 2022-01-01 NOTE — Telephone Encounter (Addendum)
Pt is aware of + CT result, missed his scheduled tx appt on 12/23/21, and has not returned additional phone calls.  Phone call to pt's cell phone at [phone]. Left message stating to please return to [facility] in 6 months for follow-up bloodwork to check numbers. Also, can call [phone] to reschedule tx appt that was missed on 12/23/21.

## 2022-01-01 NOTE — Telephone Encounter (Signed)
Phone call to pt's cell phone at [phone]. Left message stating to please return to [facility] in 6 months for follow-up bloodwork to check numbers. Also, can call [phone] to reschedule tx appt that was missed on 12/23/21.

## 2022-01-02 NOTE — Telephone Encounter (Signed)
See scanned 12/15/21 TR with written notes/orders by Glenna Fellows, FNP. Treatment completed 12/15/21.

## 2022-03-09 ENCOUNTER — Ambulatory Visit: Payer: Self-pay

## 2022-03-09 DIAGNOSIS — A749 Chlamydial infection, unspecified: Secondary | ICD-10-CM

## 2022-03-09 MED ORDER — DOXYCYCLINE HYCLATE 100 MG PO TABS: 100.0000 mg | ORAL_TABLET | Freq: Two times a day (BID) | ORAL | 0 refills | Status: AC

## 2022-03-09 NOTE — Progress Notes (Signed)
Pt is here for treatment of Chlamydia.  The patient was dispensed Doxycycline 100 mg #14 today. I provided counseling today regarding the medication. We discussed the medication, the side effects and when to call clinic. Patient given the opportunity to ask questions. Questions answered.  Pt was given a reminder card to return 06/2022 for [facility] and Syphilis lab work.  Condoms declined.

## 2022-06-10 ENCOUNTER — Observation Stay (HOSPITAL_COMMUNITY)
Admit: 2022-06-10 | Discharge: 2022-06-10 | Disposition: A | Discharge status: Home / Self Care | Payer: Self-pay | Attending: Internal Medicine | Admitting: Internal Medicine

## 2022-06-10 ENCOUNTER — Other Ambulatory Visit: Payer: Self-pay

## 2022-06-10 ENCOUNTER — Inpatient Hospital Stay
Admission: EM | Admit: 2022-06-10 | Discharge: 2022-06-12 | DRG: 281 | Disposition: A | Discharge status: Home / Self Care | Payer: Self-pay | Attending: Internal Medicine | Admitting: Internal Medicine

## 2022-06-10 ENCOUNTER — Emergency Department: Payer: Self-pay

## 2022-06-10 DIAGNOSIS — Z8249 Family history of ischemic heart disease and other diseases of the circulatory system: Secondary | ICD-10-CM

## 2022-06-10 DIAGNOSIS — I082 Rheumatic disorders of both aortic and tricuspid valves: Secondary | ICD-10-CM | POA: Diagnosis present

## 2022-06-10 DIAGNOSIS — I517 Cardiomegaly: Secondary | ICD-10-CM

## 2022-06-10 DIAGNOSIS — R079 Chest pain, unspecified: Secondary | ICD-10-CM

## 2022-06-10 DIAGNOSIS — Z7982 Long term (current) use of aspirin: Secondary | ICD-10-CM

## 2022-06-10 DIAGNOSIS — Q231 Congenital insufficiency of aortic valve: Secondary | ICD-10-CM

## 2022-06-10 DIAGNOSIS — I272 Pulmonary hypertension, unspecified: Secondary | ICD-10-CM | POA: Diagnosis present

## 2022-06-10 DIAGNOSIS — F17210 Nicotine dependence, cigarettes, uncomplicated: Secondary | ICD-10-CM | POA: Diagnosis present

## 2022-06-10 DIAGNOSIS — I350 Nonrheumatic aortic (valve) stenosis: Secondary | ICD-10-CM

## 2022-06-10 DIAGNOSIS — E785 Hyperlipidemia, unspecified: Secondary | ICD-10-CM | POA: Diagnosis present

## 2022-06-10 DIAGNOSIS — Z79899 Other long term (current) drug therapy: Secondary | ICD-10-CM

## 2022-06-10 DIAGNOSIS — Z597 Insufficient social insurance and welfare support: Secondary | ICD-10-CM

## 2022-06-10 DIAGNOSIS — I5032 Chronic diastolic (congestive) heart failure: Secondary | ICD-10-CM | POA: Diagnosis present

## 2022-06-10 DIAGNOSIS — I214 Non-ST elevation (NSTEMI) myocardial infarction: Principal | ICD-10-CM | POA: Diagnosis present

## 2022-06-10 DIAGNOSIS — K219 Gastro-esophageal reflux disease without esophagitis: Secondary | ICD-10-CM | POA: Diagnosis present

## 2022-06-10 DIAGNOSIS — I10 Essential (primary) hypertension: Secondary | ICD-10-CM | POA: Diagnosis present

## 2022-06-10 DIAGNOSIS — Z5986 Financial insecurity: Secondary | ICD-10-CM

## 2022-06-10 DIAGNOSIS — E119 Type 2 diabetes mellitus without complications: Secondary | ICD-10-CM | POA: Diagnosis present

## 2022-06-10 DIAGNOSIS — Z8673 Personal history of transient ischemic attack (TIA), and cerebral infarction without residual deficits: Secondary | ICD-10-CM

## 2022-06-10 DIAGNOSIS — Z833 Family history of diabetes mellitus: Secondary | ICD-10-CM

## 2022-06-10 DIAGNOSIS — I110 Hypertensive heart disease with heart failure: Secondary | ICD-10-CM | POA: Diagnosis present

## 2022-06-10 DIAGNOSIS — F1410 Cocaine abuse, uncomplicated: Secondary | ICD-10-CM | POA: Diagnosis present

## 2022-06-10 DIAGNOSIS — I160 Hypertensive urgency: Secondary | ICD-10-CM | POA: Diagnosis present

## 2022-06-10 HISTORY — DX: Tobacco use: Z72.0

## 2022-06-10 HISTORY — DX: Nonrheumatic aortic (valve) stenosis: I35.0

## 2022-06-10 HISTORY — DX: Other psychoactive substance abuse, uncomplicated: F19.10

## 2022-06-10 HISTORY — DX: Cerebral infarction, unspecified: I63.9

## 2022-06-10 LAB — LIPID PANEL
Cholesterol: 163 mg/dL (ref 0–200)
HDL: 50 mg/dL (ref >40)
LDL Cholesterol: 94 mg/dL (ref 0–99)
Total CHOL/HDL Ratio: 3.3 RATIO
Triglycerides: 95 mg/dL (ref <150)
VLDL: 19 mg/dL (ref 0–40)

## 2022-06-10 LAB — BASIC METABOLIC PANEL
Anion gap: 8 (ref 5–15)
BUN: 19 mg/dL (ref 6–20)
CO2: 24 mmol/L (ref 22–32)
Calcium: 9.1 mg/dL (ref 8.9–10.3)
Chloride: 105 mmol/L (ref 98–111)
Creatinine, Ser: 1.03 mg/dL (ref 0.61–1.24)
GFR, Estimated: >60 mL/min (ref >60)
Glucose, Bld: 186 mg/dL — ABNORMAL HIGH (ref 70–99)
Potassium: 4 mmol/L (ref 3.5–5.1)
Sodium: 137 mmol/L (ref 135–145)

## 2022-06-10 LAB — ECHOCARDIOGRAM COMPLETE: Weight: 3200 oz

## 2022-06-10 LAB — CBC
HCT: 44.1 % (ref 39.0–52.0)
Hemoglobin: 14.3 g/dL (ref 13.0–17.0)
MCH: 30.4 pg (ref 26.0–34.0)
MCHC: 32.4 g/dL (ref 30.0–36.0)
MCV: 93.8 fL (ref 80.0–100.0)
Platelets: 154 10*3/uL (ref 150–400)
RBC: 4.7 MIL/uL (ref 4.22–5.81)
RDW: 12.5 % (ref 11.5–15.5)
WBC: 7.3 10*3/uL (ref 4.0–10.5)
nRBC: 0 % (ref 0.0–0.2)

## 2022-06-10 LAB — TROPONIN I (HIGH SENSITIVITY)
Troponin I (High Sensitivity): 15 ng/L (ref <18)
Troponin I (High Sensitivity): 45 ng/L — ABNORMAL HIGH (ref <18)
Troponin I (High Sensitivity): 665 ng/L (ref <18)

## 2022-06-10 MED ORDER — HEPARIN (PORCINE) 25000 UT/250ML-% IV SOLN
1350.0000 [IU]/h | INTRAVENOUS | Status: DC
  Administered 2022-06-10: 1200 [IU]/h via INTRAVENOUS
  Administered 2022-06-11: 1350 [IU]/h via INTRAVENOUS
  Filled 2022-06-10 (×2): qty 250

## 2022-06-10 MED ORDER — ONDANSETRON HCL 4 MG/2ML IJ SOLN: 4.0000 mg | Freq: Four times a day (QID) | INTRAMUSCULAR | Status: DC | PRN

## 2022-06-10 MED ORDER — ASPIRIN 81 MG PO CHEW
324.0000 mg | CHEWABLE_TABLET | Freq: Once | ORAL | Status: AC
  Administered 2022-06-10: 324 mg via ORAL
  Filled 2022-06-10: qty 4

## 2022-06-10 MED ORDER — ENOXAPARIN SODIUM 40 MG/0.4ML IJ SOSY
40.0000 mg | PREFILLED_SYRINGE | INTRAMUSCULAR | Status: DC
  Administered 2022-06-10: 40 mg via SUBCUTANEOUS
  Filled 2022-06-10: qty 0.4

## 2022-06-10 MED ORDER — AMLODIPINE BESYLATE 5 MG PO TABS
5.0000 mg | ORAL_TABLET | Freq: Every day | ORAL | Status: DC
  Administered 2022-06-10 – 2022-06-11 (×2): 5 mg via ORAL
  Filled 2022-06-10 (×2): qty 1

## 2022-06-10 MED ORDER — ACETAMINOPHEN 325 MG PO TABS: 650.0000 mg | ORAL_TABLET | ORAL | Status: DC | PRN

## 2022-06-10 MED ORDER — NITROGLYCERIN 0.4 MG SL SUBL
0.4000 mg | SUBLINGUAL_TABLET | SUBLINGUAL | Status: DC | PRN
  Filled 2022-06-10: qty 1

## 2022-06-10 NOTE — Progress Notes (Signed)
ANTICOAGULATION CONSULT NOTE - Initial Consult  Pharmacy Consult for  Heparin  Indication: chest pain/ACS  No Known Allergies  Patient Measurements: Height: 5\' 9"  (175.3 cm) Weight: 90.7 kg (200 lb) IBW/kg (Calculated) : 70.7 Heparin Dosing Weight: 89.1 kg   Vital Signs: Temp: 98.1 F (36.7 C) (05/22 2022) Temp Source: Oral (05/22 2022) BP: 156/86 (05/22 2022) Pulse Rate: 87 (05/22 2022)  Labs: Recent Labs    06/10/22 1224 06/10/22 1424 06/10/22 2043  HGB 14.3  --   --   HCT 44.1  --   --   PLT 154  --   --   CREATININE 1.03  --   --   TROPONINIHS 15 45* 665*    Estimated Creatinine Clearance: 90.2 mL/min (by C-G formula based on SCr of 1.03 mg/dL).   Medical History: Past Medical History:  Diagnosis Date   Hypertension     Medications:  (Not in a hospital admission)   Assessment: Pharmacy consulted to dose heparin in this 57 year old male admitted with ACS/NSTEMI.  No PTA anticoag noted.  Pt received lovenox 40 mg SQ X 1 on 5/22 @ 2205. CrCl = 90.2 ml/min   Goal of Therapy:  Heparin level 0.3-0.7 units/ml Monitor platelets by anticoagulation protocol: Yes   Plan:  Lovenox 40 mg SQ X 1 given on 5/22 @ 2205, no bolus for this pt.  Start heparin infusion at 1200 units/hr Check anti-Xa level in 6 hours and daily while on heparin Continue to monitor H&H and platelets  Airam Runions D 06/10/2022,11:16 PM

## 2022-06-10 NOTE — Assessment & Plan Note (Addendum)
History of untreated hypertension due to lack of health insurance. Patient is interested in initiating treatment if indicated.  - Will start with amlodipine 5 mg daily given limited monitoring required in the case it is a while before patient gets a PCP appointment

## 2022-06-10 NOTE — ED Triage Notes (Signed)
Pt to ED for centralized chest pain started an hour ago. +shob.

## 2022-06-10 NOTE — H&P (Signed)
History and Physical    Patient: Christopher Spencer:[id number] DOB: 03-10-1965 DOA: 06/10/2022 DOS: the patient was seen and examined on 06/10/2022 PCP: Patient, No Pcp Per  Patient coming from: Home  Chief Complaint:  Chief Complaint  Patient presents with   Chest Pain   HPI: Christopher Spencer is a 57 y.o. male with medical history significant of hypertension and substance use disorder, who presents to the ED due to chest pain.  [name] states he was in his usual state of health at work when he had sudden onset chest pain that occurred a couple hours after eating a big meal.  He assumed it was acid reflux but the pain persisted.  He states it was substernal with no radiation.  The pain intensified and he experienced of breath.  He went to sit down and had some improvement in his symptoms.  He then drove to the ED.  Since arriving to the ED, he notes that his symptoms have essentially resolved.  He denies any diaphoresis, palpitations.  He denies any recent illness including fever, chills, nausea, vomiting, diarrhea, abdominal pain, cough, rhinorrhea.  He denies any orthopnea or lower extremity edema.  He denies any recent substance use stating he has been clean and sober for approximately 7 to 8 months.  He is not currently taking any medications.  ED course: On arrival to the ED, patient was hypertensive at 166/102 with heart rate of 87.  He was saturating at 95% on room air.  He was afebrile at 97.8. Initial workup notable for normal CBC, normal BMP with exception of glucose of 186.  Initial troponin 15 with climbed up to 45.  Chest x-ray was obtained with no acute cardiopulmonary disease.  Due to high risk chest pain, [facility] contacted for admission.  Review of Systems: As mentioned in the history of present illness. All other systems reviewed and are negative.  Past Medical History:  Diagnosis Date   Hypertension    Past Surgical History:  Procedure Laterality Date   CHOLECYSTECTOMY      Social History:  reports that he has been smoking cigarettes. He has a 2.50 pack-year smoking history. He has never used smokeless tobacco. He reports current drug use. Drugs: Marijuana and Cocaine. He reports that he does not drink alcohol.  No Known Allergies  Family History  Problem Relation Age of Onset   Diabetes Father    Heart disease Sister     Prior to Admission medications   Medication Sig Start Date End Date Taking? Authorizing Provider  aspirin EC 81 MG tablet Take 1 tablet (81 mg total) by mouth daily. Patient not taking: Reported on 05/22/2021 12/05/17   Salary, Jetty Duhamel D, MD  ibuprofen (ADVIL) 800 MG tablet Take 1 tablet (800 mg total) by mouth every 8 (eight) hours as needed. Patient not taking: Reported on 05/22/2021 08/24/20   Faythe Ghee, PA-C  traMADol (ULTRAM) 50 MG tablet Take 1 tablet (50 mg total) by mouth every 6 (six) hours as needed. Patient not taking: Reported on 05/22/2021 08/24/20   Faythe Ghee, PA-C   Physical Exam: Vitals:   06/10/22 1222 06/10/22 1228  BP:  (!) 166/102  Pulse:  87  Resp:  18  Temp:  97.8 F (36.6 C)  SpO2:  95%  Weight: 90.7 kg   Height: 5\' 9"  (1.753 m)    Physical Exam Vitals and nursing note reviewed.  Constitutional:      General: He is not in acute  distress.    Appearance: He is normal weight.  HENT:     Head: Normocephalic and atraumatic.  Eyes:     Extraocular Movements: Extraocular movements intact.     Pupils: Pupils are equal, round, and reactive to light.  Cardiovascular:     Rate and Rhythm: Normal rate and regular rhythm.     Heart sounds: Murmur (4/6 holosystolic murmur loudest in the right upper sternal border) heard.  Pulmonary:     Effort: Pulmonary effort is normal. No tachypnea.     Breath sounds: No decreased breath sounds, wheezing, rhonchi or rales.  Chest:     Chest wall: No tenderness.  Abdominal:     Palpations: Abdomen is soft.  Musculoskeletal:     Cervical back: Neck supple.     Right  lower leg: No edema.     Left lower leg: No edema.  Skin:    General: Skin is warm and dry.  Neurological:     General: No focal deficit present.     Mental Status: He is alert and oriented to person, place, and time.  Psychiatric:        Mood and Affect: Mood normal.        Behavior: Behavior normal.    Data Reviewed: CBC with WBC of 7.3, hemoglobin 14.3, platelets of 154 BMP with sodium of 137, potassium 4.0, bicarb 24, glucose 186, BUN 19, creatinine 1.03, GFR above 60 Troponin initially 15 with increased to 45  EKG personally reviewed.  Sinus rhythm with a rate of 83.  T wave inversions in leads II, 3, aVF, V4 V5.  ST elevation in leads V2 and V3 most consistent with J-point elevation.  Compared to EKG obtained 1 year ago, no significant changes noted.  DG Chest 2 View  Result Date: 06/10/2022 CLINICAL DATA:  Chest pain. EXAM: CHEST - 2 VIEW COMPARISON:  Chest x-ray May 3, 23. FINDINGS: The heart size and mediastinal contours are within normal limits. Both lungs are clear. Mild coarsened lung markings appears chronic. No visible pleural effusions or pneumothorax. No acute osseous abnormality. IMPRESSION: No active cardiopulmonary disease. Electronically Signed   By: Feliberto Harts M.D.   On: 06/10/2022 13:15    Results are pending, will review when available.  Assessment and Plan:  * Chest pain Patient is presenting with chest pain that occurred several hours after eating and self resolved with no intervention.  He does have a family history of early heart disease, untreated hypertension and tobacco use placing him at risk for cardiovascular disease.  However, given chest pain has resolved and troponin leak is minimal, low suspicion for active ACS.  - Telemetry monitoring - Continue to trend troponin - Hold off on heparin infusion unless chest pain recurs or troponin markedly increases - Echocardiogram ordered - A1c and lipid panel for risk stratification - Can consult  cardiology tomorrow if clinically indicated  Essential hypertension History of untreated hypertension due to lack of health insurance. Patient is interested in initiating treatment if indicated.  - Will start with amlodipine 5 mg daily given limited monitoring required in the case it is a while before patient gets a PCP appointment  Advance Care Planning:   Code Status: Full Code verified by patient with sister at bedside  Consults: None  Family Communication: Patient's sister updated at bedside  Severity of Illness: The appropriate patient status for this patient is OBSERVATION. Observation status is judged to be reasonable and necessary in order to provide the required intensity  of service to ensure the patient's safety. The patient's presenting symptoms, physical exam findings, and initial radiographic and laboratory data in the context of their medical condition is felt to place them at decreased risk for further clinical deterioration. Furthermore, it is anticipated that the patient will be medically stable for discharge from the hospital within 2 midnights of admission.   Author: Verdene Lennert, MD 06/10/2022 3:59 PM  For on call review [email or web address].

## 2022-06-10 NOTE — Assessment & Plan Note (Signed)
Patient is presenting with chest pain that occurred several hours after eating and self resolved with no intervention.  He does have a family history of early heart disease, untreated hypertension and tobacco use placing him at risk for cardiovascular disease.  However, given chest pain has resolved and troponin leak is minimal, low suspicion for active ACS.  - Telemetry monitoring - Continue to trend troponin - Hold off on heparin infusion unless chest pain recurs or troponin markedly increases - Echocardiogram ordered - A1c and lipid panel for risk stratification - Can consult cardiology tomorrow if clinically indicated

## 2022-06-10 NOTE — ED Provider Notes (Signed)
[facility] Provider Note    Event Date/Time   First MD Initiated Contact with Patient 06/10/22 1336     (approximate)  History   Chief Complaint: Chest Pain  HPI  Christopher Spencer is a 57 y.o. male with a past medical history of hypertension who presents to the emergency department for chest pain.  According to the patient approximately an hour before arrival he developed pain in the center of his chest with some mild shortness of breath.  Patient states the pain was more significant earlier but is very minimal now.  Denies any shortness of breath at this time.  No recent illness no fever cough congestion.  No pleuritic pain.  Physical Exam   Triage Vital Signs: ED Triage Vitals  Enc Vitals Group     BP 06/10/22 1228 (!) 166/102     Pulse Rate 06/10/22 1228 87     Resp 06/10/22 1228 18     Temp 06/10/22 1228 97.8 F (36.6 C)     Temp src --      SpO2 06/10/22 1228 95 %     Weight 06/10/22 1222 200 lb (90.7 kg)     Height 06/10/22 1222 5\' 9"  (1.753 m)     Head Circumference --      Peak Flow --      Pain Score 06/10/22 1222 7     Pain Loc --      Pain Edu? --      Excl. in GC? --     Most recent vital signs: Vitals:   06/10/22 1228  BP: (!) 166/102  Pulse: 87  Resp: 18  Temp: 97.8 F (36.6 C)  SpO2: 95%    General: Awake, no distress.  CV:  Good peripheral perfusion.  Regular rate and rhythm  Resp:  Normal effort.  Equal breath sounds bilaterally.  Abd:  No distention.  Soft, nontender.  No rebound or guarding.  ED Results / Procedures / Treatments   EKG  EKG viewed and interpreted by myself shows a normal sinus rhythm 83 bpm with a narrow QRS, normal axis, normal intervals.  Patient does have inferolateral T wave inversions however this is largely unchanged compared to the patient's past EKG.  RADIOLOGY  I have reviewed and interpreted the chest x-ray images.  No consolidation seen on my evaluation.  Radiology is read the x-ray  as negative.   MEDICATIONS ORDERED IN ED: Medications - No data to display   IMPRESSION / MDM / ASSESSMENT AND PLAN / ED COURSE  I reviewed the triage vital signs and the nursing notes.  Patient's presentation is most consistent with acute presentation with potential threat to life or bodily function.  Patient presents emergency department for chest pain starting approximate 1 hour prior to arrival.  Patient states the pain is still present but very minimal currently.  Patient's initial workup reassuring with a normal CBC normal chemistry and a negative troponin.  Chest x-ray is clear.  EKG does show abnormal findings with inferolateral T wave inversions with some mild depression however this is largely unchanged from the patient's past EKG.  Patient's repeat troponin has elevated from 15 to now 45.  Given the onset of symptoms just before arrival with abnormal EKG and elevating troponin we will admit to the hospital service for further workup and treatment.  Will dose 324 mg of aspirin.  Discussed this with the patient and sister who are agreeable to this plan.  FINAL CLINICAL  IMPRESSION(S) / ED DIAGNOSES   Chest pain    Note:  This document was prepared using Dragon voice recognition software and may include unintentional dictation errors.   Minna Antis, MD 06/10/22 [phone]

## 2022-06-11 ENCOUNTER — Encounter: Payer: Self-pay | Admitting: Internal Medicine

## 2022-06-11 DIAGNOSIS — E785 Hyperlipidemia, unspecified: Secondary | ICD-10-CM

## 2022-06-11 DIAGNOSIS — F17210 Nicotine dependence, cigarettes, uncomplicated: Secondary | ICD-10-CM

## 2022-06-11 DIAGNOSIS — I350 Nonrheumatic aortic (valve) stenosis: Secondary | ICD-10-CM

## 2022-06-11 DIAGNOSIS — J90 Pleural effusion, not elsewhere classified: Secondary | ICD-10-CM

## 2022-06-11 DIAGNOSIS — I272 Pulmonary hypertension, unspecified: Secondary | ICD-10-CM

## 2022-06-11 DIAGNOSIS — I214 Non-ST elevation (NSTEMI) myocardial infarction: Secondary | ICD-10-CM

## 2022-06-11 DIAGNOSIS — I110 Hypertensive heart disease with heart failure: Secondary | ICD-10-CM

## 2022-06-11 DIAGNOSIS — I517 Cardiomegaly: Secondary | ICD-10-CM

## 2022-06-11 LAB — URINE DRUG SCREEN, QUALITATIVE (ARMC ONLY)
Amphetamines, Ur Screen: NOT DETECTED
Barbiturates, Ur Screen: NOT DETECTED
Benzodiazepine, Ur Scrn: NOT DETECTED
Cannabinoid 50 Ng, Ur ~~LOC~~: NOT DETECTED
Cocaine Metabolite,Ur ~~LOC~~: NOT DETECTED
MDMA (Ecstasy)Ur Screen: NOT DETECTED
Methadone Scn, Ur: NOT DETECTED
Opiate, Ur Screen: NOT DETECTED
Phencyclidine (PCP) Ur S: NOT DETECTED
Tricyclic, Ur Screen: NOT DETECTED

## 2022-06-11 LAB — TROPONIN I (HIGH SENSITIVITY): Troponin I (High Sensitivity): 2687 ng/L (ref <18)

## 2022-06-11 LAB — HEPARIN LEVEL (UNFRACTIONATED)
Heparin Unfractionated: 0.42 IU/mL (ref 0.30–0.70)
Heparin Unfractionated: 0.27 IU/mL — ABNORMAL LOW (ref 0.30–0.70)
Heparin Unfractionated: 0.31 IU/mL (ref 0.30–0.70)

## 2022-06-11 LAB — ECHOCARDIOGRAM COMPLETE
AR max vel: 0.88 cm2
AV Area VTI: 0.95 cm2
AV Area mean vel: 0.83 cm2
AV Mean grad: 41.6 mmHg
AV Peak grad: 69.8 mmHg
Ao pk vel: 4.18 m/s
Area-P 1/2: 5.25 cm2
Height: 69 in
MV M vel: 6.24 m/s
MV Peak grad: 155.5 mmHg
P 1/2 time: 233 msec
S' Lateral: 3.3 cm

## 2022-06-11 LAB — BASIC METABOLIC PANEL
Anion gap: 7 (ref 5–15)
BUN: 15 mg/dL (ref 6–20)
CO2: 26 mmol/L (ref 22–32)
Calcium: 8.8 mg/dL — ABNORMAL LOW (ref 8.9–10.3)
Chloride: 104 mmol/L (ref 98–111)
Creatinine, Ser: 0.88 mg/dL (ref 0.61–1.24)
GFR, Estimated: >60 mL/min (ref >60)
Glucose, Bld: 150 mg/dL — ABNORMAL HIGH (ref 70–99)
Potassium: 3.8 mmol/L (ref 3.5–5.1)
Sodium: 137 mmol/L (ref 135–145)

## 2022-06-11 LAB — HIV ANTIBODY (ROUTINE TESTING W REFLEX): HIV Screen 4th Generation wRfx: NONREACTIVE

## 2022-06-11 MED ORDER — CARVEDILOL 3.125 MG PO TABS: 3.1250 mg | ORAL_TABLET | Freq: Two times a day (BID) | ORAL | Status: DC

## 2022-06-11 MED ORDER — SODIUM CHLORIDE 0.9 % IV SOLN: 250.0000 mL | INTRAVENOUS | Status: DC | PRN

## 2022-06-11 MED ORDER — CARVEDILOL 6.25 MG PO TABS
6.2500 mg | ORAL_TABLET | Freq: Two times a day (BID) | ORAL | Status: DC
  Administered 2022-06-11 – 2022-06-12 (×2): 6.25 mg via ORAL
  Filled 2022-06-11 (×2): qty 1

## 2022-06-11 MED ORDER — SODIUM CHLORIDE 0.9% FLUSH
3.0000 mL | Freq: Two times a day (BID) | INTRAVENOUS | Status: DC
  Administered 2022-06-11 (×2): 3 mL via INTRAVENOUS

## 2022-06-11 MED ORDER — SODIUM CHLORIDE 0.9% FLUSH: 3.0000 mL | INTRAVENOUS | Status: DC | PRN

## 2022-06-11 MED ORDER — HEPARIN BOLUS VIA INFUSION
1400.0000 [IU] | Freq: Once | INTRAVENOUS | Status: AC
  Administered 2022-06-11: 1400 [IU] via INTRAVENOUS
  Filled 2022-06-11: qty 1400

## 2022-06-11 MED ORDER — ASPIRIN 81 MG PO CHEW
81.0000 mg | CHEWABLE_TABLET | Freq: Every day | ORAL | Status: DC
  Administered 2022-06-11 – 2022-06-12 (×2): 81 mg via ORAL
  Filled 2022-06-11 (×2): qty 1

## 2022-06-11 MED ORDER — SODIUM CHLORIDE 0.9 % IV SOLN: INTRAVENOUS | Status: DC

## 2022-06-11 MED ORDER — ATORVASTATIN CALCIUM 80 MG PO TABS
80.0000 mg | ORAL_TABLET | Freq: Every day | ORAL | Status: DC
  Administered 2022-06-11 – 2022-06-12 (×2): 80 mg via ORAL
  Filled 2022-06-11: qty 1
  Filled 2022-06-11: qty 4

## 2022-06-11 MED ORDER — LOSARTAN POTASSIUM 25 MG PO TABS
25.0000 mg | ORAL_TABLET | Freq: Every day | ORAL | Status: DC
  Administered 2022-06-11 – 2022-06-12 (×2): 25 mg via ORAL
  Filled 2022-06-11 (×2): qty 1

## 2022-06-11 MED ORDER — ASPIRIN 81 MG PO CHEW
81.0000 mg | CHEWABLE_TABLET | ORAL | Status: AC
  Administered 2022-06-12: 81 mg via ORAL
  Filled 2022-06-11: qty 1

## 2022-06-11 MED ORDER — AMLODIPINE BESYLATE 5 MG PO TABS: 5.0000 mg | ORAL_TABLET | Freq: Every day | ORAL | Status: DC

## 2022-06-11 NOTE — Progress Notes (Signed)
ANTICOAGULATION CONSULT NOTE - Initial Consult  Pharmacy Consult for  Heparin  Indication: chest pain/ACS  No Known Allergies  Patient Measurements: Height: 5\' 9"  (175.3 cm) Weight: 90.7 kg (200 lb) IBW/kg (Calculated) : 70.7 Heparin Dosing Weight: 89.1 kg   Vital Signs: Temp: 97.9 F (36.6 C) (05/23 1109) Temp Source: Oral (05/23 1109) BP: 138/83 (05/23 1500) Pulse Rate: 68 (05/23 1500)  Labs: Recent Labs    06/10/22 1224 06/10/22 1424 06/10/22 2043 06/11/22 0714 06/11/22 1504  HGB 14.3  --   --   --   --   HCT 44.1  --   --   --   --   PLT 154  --   --   --   --   HEPARINUNFRC  --   --   --  0.42 0.27*  CREATININE 1.03  --   --  0.88  --   TROPONINIHS 15 45* 665* 2,687*  --      Estimated Creatinine Clearance: 105.6 mL/min (by C-G formula based on SCr of 0.88 mg/dL).   Medical History: Past Medical History:  Diagnosis Date   Aortic stenosis    a. 11/2017 Echo: EF 60-65%. No rwma, Gr2 DD, mod AS (AVA 1.12 cm^2), mild AI/MR.   Cerebellar stroke (HCC)    a. 11/2017 in setting of drug abuse.   Hypertension    Polysubstance abuse (HCC)    Tobacco abuse     Medications:  No PTA anticoagulation per pharmacist review  Assessment: Pharmacy consulted to dose heparin in this 57 year old male admitted with ACS/NSTEMI.  No PTA anticoag noted.  Pt received lovenox 40 mg SQ X 1 on 5/22 @ 2205. CrCl = 90.2 ml/min   Goal of Therapy:  Heparin level 0.3-0.7 units/ml Monitor platelets by anticoagulation protocol: Yes   5/23 0714 HL 0.42, therapeutic x 1 5/23 1504 HL 0.27, subtherapeutic   Plan:  Bolus 1400 units x 1 Increase heparin infusion to 1350 units/hr Recheck HL in 6 hours after rate change Daily CBC while on heparin   Bettey Costa, PharmD 06/11/2022,3:58 PM

## 2022-06-11 NOTE — Consult Note (Signed)
Cardiology Consult    Patient ID: Christopher Spencer MRN: [id number], DOB/AGE: 20-Nov-1965   Admit date: 06/10/2022 Date of Consult: 06/11/2022  Primary Physician: Patient, No Pcp Per Primary Cardiologist: Julien Nordmann, MD Requesting Provider: Seth Bake. Blake Divine, MD  Patient Profile    Christopher Spencer is a 57 y.o. male with a history of HTN and polysubstance abuse, who is being seen today for the evaluation of NSTEMI and severe aortic stenosis at the request of Dr. Blake Divine.  Past Medical History   Past Medical History:  Diagnosis Date   Aortic stenosis    a. 11/2017 Echo: EF 60-65%. No rwma, Gr2 DD, mod AS (AVA 1.12 cm^2), mild AI/MR.   Cerebellar stroke (HCC)    a. 11/2017 in setting of drug abuse.   Hypertension    Polysubstance abuse (HCC)    Tobacco abuse     Past Surgical History:  Procedure Laterality Date   CHOLECYSTECTOMY       Allergies  No Known Allergies  History of Present Illness    57 y/o ? w/ a h/o HTN, aortic stenosis, and polysubstance abuse.  In 2019, he was admitted for seizure in the setting of drug use and cerebellar stroke. Echo during that admission showed nl EF w/ grade 2 diast dysfxn and moderate AS.  More recently, he was admitted in 05/2021 w/ chest pain in the setting of cocaine use, that was initially called out as a STEMI, though after review of ECGs (LVH w/ repol abnormalities), STEMI was called off.  HsTrops were minimally elevated w/ flat trend to 37 (felt to be 2/2 vasospasm), and he was conservatively managed by the medicine team.  Pt presented to the [facility] ED on 5/22 due to sudden onset of c/p that occurred a few hours after eating a large meal and became assoc w/ dyspnea.  In the ED, ECG showed sinus w/ LVH and repol abnormalities.  BP 166/102.  Though initial HsTrop was nl @ 15, subsequent values increased (15  45  665  2687).  Tox screen neg.  CXR w/o acute findings.  Echo preliminarily shows nl EF w/ severe aortic stenosis.  We've been asked to eval.   Currently c/p free.  Inpatient Medications     amLODipine  5 mg Oral Daily    Family History    Family History  Problem Relation Age of Onset   Diabetes Father    Heart disease Sister    He indicated that the status of his father is unknown. He indicated that the status of his sister is unknown.   Social History    Social History   Socioeconomic History   Marital status: Single    Spouse name: Not on file   Number of children: Not on file   Years of education: Not on file   Highest education level: Not on file  Occupational History   Not on file  Tobacco Use   Smoking status: Every Day    Packs/day: 0.25    Years: 10.00    Additional pack years: 0.00    Total pack years: 2.50    Types: Cigarettes   Smokeless tobacco: Never  Vaping Use   Vaping Use: Never used  Substance and Sexual Activity   Alcohol use: No   Drug use: Yes    Types: Marijuana, Cocaine    Comment: couple times a month   Sexual activity: Yes    Birth control/protection: Condom  Other Topics Concern   Not on file  Social History Narrative   Not on file   Social Determinants of Health   Financial Resource Strain: High Risk (12/04/2017)   Overall Financial Resource Strain (CARDIA)    Difficulty of Paying Living Expenses: Very hard  Food Insecurity: Food Insecurity Present (12/04/2017)   Hunger Vital Sign    Worried About Running Out of Food in the Last Year: Sometimes true    Ran Out of Food in the Last Year: Sometimes true  Transportation Needs: Unknown (12/04/2017)   PRAPARE - Administrator, Civil Service (Medical): Patient declined    Lack of Transportation (Non-Medical): Patient declined  Physical Activity: Sufficiently Active (12/04/2017)   Exercise Vital Sign    Days of Exercise per Week: 6 days    Minutes of Exercise per Session: 150+ min  Stress: Stress Concern Present (12/04/2017)   [employer] of Occupational Health - Occupational Stress Questionnaire     Feeling of Stress : Rather much  Social Connections: Unknown (12/04/2017)   Social Connection and Isolation Panel [NHANES]    Frequency of Communication with Friends and Family: Patient declined    Frequency of Social Gatherings with Friends and Family: Patient declined    Attends Religious Services: Patient declined    Database administrator or Organizations: Patient declined    Attends Banker Meetings: Patient declined    Marital Status: Patient declined  Intimate Partner Violence: Not At Risk (12/15/2021)   Humiliation, Afraid, Rape, and Kick questionnaire    Fear of Current or Ex-Partner: No    Emotionally Abused: No    Physically Abused: No    Sexually Abused: No     Review of Systems    General:  No chills, fever, night sweats or weight changes.  Cardiovascular:  +++ chest pain, +++ dyspnea on exertion, edema, orthopnea, palpitations, paroxysmal nocturnal dyspnea. Dermatological: No rash, lesions/masses Respiratory: No cough, dyspnea Urologic: No hematuria, dysuria Abdominal:   No nausea, vomiting, diarrhea, bright red blood per rectum, melena, or hematemesis Neurologic:  No visual changes, wkns, changes in mental status. All other systems reviewed and are otherwise negative except as noted above.  Physical Exam    Blood pressure (!) 165/99, pulse 67, temperature 97.6 F (36.4 C), temperature source Oral, resp. rate 18, height 5\' 9"  (1.753 m), weight 90.7 kg, SpO2 99 %.  General: Pleasant, NAD Psych: Normal affect. Neuro: Alert and oriented X 3. Moves all extremities spontaneously. HEENT: Normal  Neck: Supple without bruits or JVD. Lungs:  Resp regular and unlabored, CTA. Heart: RRR, 3/6 SEM loudest @ the upper sternal borders, no s3, s4, or murmurs. Abdomen: Soft, non-tender, non-distended, BS + x 4.  Extremities: No clubbing, cyanosis or edema. DP/PT2+, Radials 2+ and equal bilaterally.  Labs    Cardiac Enzymes Recent Labs  Lab 06/10/22 1224  06/10/22 1424 06/10/22 2043 06/11/22 0714  TROPONINIHS 15 45* 665* 2,687*     BNP    Component Value Date/Time   BNP 49.0 02/17/2018 1606    Lab Results  Component Value Date   WBC 7.3 06/10/2022   HGB 14.3 06/10/2022   HCT 44.1 06/10/2022   MCV 93.8 06/10/2022   PLT 154 06/10/2022    Recent Labs  Lab 06/11/22 0714  NA 137  K 3.8  CL 104  CO2 26  BUN 15  CREATININE 0.88  CALCIUM 8.8*  GLUCOSE 150*   Lab Results  Component Value Date   CHOL 163 06/10/2022   HDL 50 06/10/2022  LDLCALC 94 06/10/2022   TRIG 95 06/10/2022      Radiology Studies    DG Chest 2 View  Result Date: 06/10/2022 CLINICAL DATA:  Chest pain. EXAM: CHEST - 2 VIEW COMPARISON:  Chest x-ray May 3, 23. FINDINGS: The heart size and mediastinal contours are within normal limits. Both lungs are clear. Mild coarsened lung markings appears chronic. No visible pleural effusions or pneumothorax. No acute osseous abnormality. IMPRESSION: No active cardiopulmonary disease. Electronically Signed   By: Feliberto Harts M.D.   On: 06/10/2022 13:15    ECG & Cardiac Imaging    RSR, 75, LVH, inferolateral ST depression/TWI (similar seen dating back to 05/2009)  - personally reviewed.  Assessment & Plan    1. NSTEMI:  Pt presented w/ chest pain and HTN urgency.  H/o polysubstance abuse and presumed cor vasospasm, though clean x several months and tox screen neg.  ECG w/ LVH and repol abnormalities.  Prelim echo w/ nl EF but severe AS.  Cont heparin and asa. Adding ? blocker, statin.  Plan on R and L heart cath tomorrow.  The patient understands that risks include but are not limited to stroke (1 in 1000), death (1 in 1000), kidney failure [usually temporary] (1 in 500), bleeding (1 in 200), allergic reaction [possibly serious] (1 in 200), and agrees to proceed.    2.  Severe Ao stenosis:  prev moderate on echo in 11/2017.  Echo this AM w/ severe AS.  As above, R & L heart cath tomorrow.  Adding ? blocker.  D/c  amlodipine.  3.  HTN Urgency:  BP still elevated @ 165/99.  Amlodipine added but not ideal in setting of severe AS.  Adding carvedilol.  Avoid vasodilators/afterload reducing agents.  4.  Polysubstance abuse:  off drugs x several months.  Risk Assessment/Risk Scores:     TIMI Risk Score for Unstable Angina or Non-ST Elevation MI:   The patient's TIMI risk score is  , which indicates a  % risk of all cause mortality, new or recurrent myocardial infarction or need for urgent revascularization in the next 14 days.   Signed, Nicolasa Ducking, NP 06/11/2022, 10:03 AM  For questions or updates, please contact   Please consult [email or web address] for contact info under Cardiology/STEMI.

## 2022-06-11 NOTE — Progress Notes (Signed)
ANTICOAGULATION CONSULT NOTE - Initial Consult  Pharmacy Consult for  Heparin  Indication: chest pain/ACS  No Known Allergies  Patient Measurements: Height: 5\' 9"  (175.3 cm) Weight: 90.7 kg (200 lb) IBW/kg (Calculated) : 70.7 Heparin Dosing Weight: 89.1 kg   Vital Signs: Temp: 97.6 F (36.4 C) (05/23 0402) Temp Source: Oral (05/23 0402) BP: 165/99 (05/23 0402) Pulse Rate: 67 (05/23 0715)  Labs: Recent Labs    06/10/22 1224 06/10/22 1424 06/10/22 2043 06/11/22 0714  HGB 14.3  --   --   --   HCT 44.1  --   --   --   PLT 154  --   --   --   HEPARINUNFRC  --   --   --  0.42  CREATININE 1.03  --   --  0.88  TROPONINIHS 15 45* 665*  --      Estimated Creatinine Clearance: 105.6 mL/min (by C-G formula based on SCr of 0.88 mg/dL).   Medical History: Past Medical History:  Diagnosis Date   Hypertension     Medications:  No PTA anticoagulation per pharmacist review  Assessment: Pharmacy consulted to dose heparin in this 57 year old male admitted with ACS/NSTEMI.  No PTA anticoag noted.  Pt received lovenox 40 mg SQ X 1 on 5/22 @ 2205. CrCl = 90.2 ml/min   Goal of Therapy:  Heparin level 0.3-0.7 units/ml Monitor platelets by anticoagulation protocol: Yes   5/23 0714 HL 0.42, therapeutic x 1  Plan:  Heparin level therapeutic x 1 Continue heparin infusion at 1200 units/hr Recheck HL in 6 hours to confirm Daily CBC while on heparin  Barrie Folk, PharmD 06/11/2022,8:08 AM

## 2022-06-11 NOTE — ED Notes (Signed)
2A nurse secretary notified patient is en route to the floor.

## 2022-06-11 NOTE — ED Notes (Signed)
ED TO INPATIENT HANDOFF REPORT  ED Nurse Name and Phone #: Al Corpus, RN [phone]  S Name/Age/Gender Christopher Spencer 57 y.o. male Room/Bed: ED34A/ED34A  Code Status   Code Status: Full Code  Home/SNF/Other Home Patient oriented to: self, place, time, and situation Is this baseline? Yes   Triage Complete: Triage complete  Chief Complaint Chest pain [R07.9]  Triage Note Pt to ED for centralized chest pain started an hour ago. +shob.    Allergies No Known Allergies  Level of Care/Admitting Diagnosis ED Disposition     ED Disposition  Admit   Condition  --   Comment  Hospital Area: [facility] [[id number]]  Level of Care: Telemetry Cardiac [[id number]]  Covid Evaluation: Asymptomatic - no recent exposure (last 10 days) testing not required  Diagnosis: Chest pain [841324]  Admitting Physician: Kathlen Mody [[id number]]  Attending Physician: Kathlen Mody [phone]  Certification:: I certify this patient will need inpatient services for at least 2 midnights  Estimated Length of Stay: 2          B Medical/Surgery History Past Medical History:  Diagnosis Date   Aortic stenosis    a. 11/2017 Echo: EF 60-65%. No rwma, Gr2 DD, mod AS (AVA 1.12 cm^2), mild AI/MR.   Cerebellar stroke (HCC)    a. 11/2017 in setting of drug abuse.   Hypertension    Polysubstance abuse (HCC)    Tobacco abuse    Past Surgical History:  Procedure Laterality Date   CHOLECYSTECTOMY       A IV Location/Drains/Wounds Patient Lines/Drains/Airways Status     Active Line/Drains/Airways     Name Placement date Placement time Site Days   Peripheral IV 06/10/22 20 G Left;Posterior Forearm 06/10/22  1609  Forearm  1            Intake/Output Last 24 hours  Intake/Output Summary (Last 24 hours) at 06/11/2022 1851 Last data filed at 06/11/2022 1528 Gross per 24 hour  Intake --  Output 2525 ml  Net -2525 ml    Labs/Imaging Results for orders placed or performed during the  hospital encounter of 06/10/22 (from the past 48 hour(s))  Basic metabolic panel     Status: Abnormal   Collection Time: 06/10/22 12:24 PM  Result Value Ref Range   Sodium 137 135 - 145 mmol/L   Potassium 4.0 3.5 - 5.1 mmol/L   Chloride 105 98 - 111 mmol/L   CO2 24 22 - 32 mmol/L   Glucose, Bld 186 (H) 70 - 99 mg/dL    Comment: Glucose reference range applies only to samples taken after fasting for at least 8 hours.   BUN 19 6 - 20 mg/dL   Creatinine, Ser 2.72 0.61 - 1.24 mg/dL   Calcium 9.1 8.9 - 53.6 mg/dL   GFR, Estimated >64 >40 mL/min    Comment: (NOTE) Calculated using the CKD-EPI Creatinine Equation (2021)    Anion gap 8 5 - 15    Comment: Performed at [facility], [address] [place], [place] [address]  CBC     Status: None   Collection Time: 06/10/22 12:24 PM  Result Value Ref Range   WBC 7.3 4.0 - 10.5 K/uL    Comment: WHITE COUNT CONFIRMED ON SMEAR   RBC 4.70 4.22 - 5.81 MIL/uL   Hemoglobin 14.3 13.0 - 17.0 g/dL   HCT 59.5 63.8 - 75.6 %   MCV 93.8 80.0 - 100.0 fL   MCH 30.4 26.0 - 34.0 pg  MCHC 32.4 30.0 - 36.0 g/dL   RDW 96.0 45.4 - 09.8 %   Platelets 154 150 - 400 K/uL   nRBC 0.0 0.0 - 0.2 %    Comment: Performed at [facility], [address] [place], [place] [address]  Troponin I (High Sensitivity)     Status: None   Collection Time: 06/10/22 12:24 PM  Result Value Ref Range   Troponin I (High Sensitivity) 15 <18 ng/L    Comment: (NOTE) Elevated high sensitivity troponin I (hsTnI) values and significant  changes across serial measurements may suggest ACS but many other  chronic and acute conditions are known to elevate hsTnI results.  Refer to the "Links" section for chest pain algorithms and additional  guidance. Performed at [facility], [address] [place], [place] [address]   Lipid panel     Status: None   Collection Time: 06/10/22 12:24 PM  Result Value Ref Range   Cholesterol 163 0 - 200 mg/dL    Triglycerides 95 <621 mg/dL   HDL 50 >30 mg/dL   Total CHOL/HDL Ratio 3.3 RATIO   VLDL 19 0 - 40 mg/dL   LDL Cholesterol 94 0 - 99 mg/dL    Comment:        Total Cholesterol/HDL:CHD Risk Coronary Heart Disease Risk Table                     Men   Women  1/2 Average Risk   3.4   3.3  Average Risk       5.0   4.4  2 X Average Risk   9.6   7.1  3 X Average Risk  23.4   11.0        Use the calculated Patient Ratio above and the CHD Risk Table to determine the patient's CHD Risk.        ATP III CLASSIFICATION (LDL):  <100     mg/dL   Optimal  865-784  mg/dL   Near or Above                    Optimal  130-159  mg/dL   Borderline  696-295  mg/dL   High  >284     mg/dL   Very High Performed at [facility], [address] [place], [place] [address]   Troponin I (High Sensitivity)     Status: Abnormal   Collection Time: 06/10/22  2:24 PM  Result Value Ref Range   Troponin I (High Sensitivity) 45 (H) <18 ng/L    Comment: READ BACK AND VERIFIED WITH [place] POHAHOE AT 1500 ON 06/10/22 BY SS (NOTE) Elevated high sensitivity troponin I (hsTnI) values and significant  changes across serial measurements may suggest ACS but many other  chronic and acute conditions are known to elevate hsTnI results.  Refer to the "Links" section for chest pain algorithms and additional  guidance. Performed at [facility], [address] [place], [place] [address]   Troponin I (High Sensitivity)     Status: Abnormal   Collection Time: 06/10/22  8:43 PM  Result Value Ref Range   Troponin I (High Sensitivity) 665 (HH) <18 ng/L    Comment: CRITICAL RESULT CALLED TO, READ BACK BY AND VERIFIED WITH ASHLEY ORSUTO 06/10/22 2137 MU (NOTE) Elevated high sensitivity troponin I (hsTnI) values and significant  changes across serial measurements may suggest ACS but many other  chronic and acute conditions are known to elevate hsTnI  results.  Refer to the "Links" section for chest pain  algorithms and additional  guidance. Performed at [facility], [address], [place], [place] [address]   Urine Drug Screen, Qualitative [facility] only)     Status: None   Collection Time: 06/11/22  6:20 AM  Result Value Ref Range   Tricyclic, Ur Screen NONE DETECTED NONE DETECTED   Amphetamines, Ur Screen NONE DETECTED NONE DETECTED   MDMA (Ecstasy)Ur Screen NONE DETECTED NONE DETECTED   Cocaine Metabolite,Ur [place] NONE DETECTED NONE DETECTED   Opiate, Ur Screen NONE DETECTED NONE DETECTED   Phencyclidine (PCP) Ur S NONE DETECTED NONE DETECTED   Cannabinoid 50 Ng, Ur [place] NONE DETECTED NONE DETECTED   Barbiturates, Ur Screen NONE DETECTED NONE DETECTED   Benzodiazepine, Ur Scrn NONE DETECTED NONE DETECTED   Methadone Scn, Ur NONE DETECTED NONE DETECTED    Comment: (NOTE) Tricyclics + metabolites, urine    Cutoff 1000 ng/mL Amphetamines + metabolites, urine  Cutoff 1000 ng/mL MDMA (Ecstasy), urine              Cutoff 500 ng/mL Cocaine Metabolite, urine          Cutoff 300 ng/mL Opiate + metabolites, urine        Cutoff 300 ng/mL Phencyclidine (PCP), urine         Cutoff 25 ng/mL Cannabinoid, urine                 Cutoff 50 ng/mL Barbiturates + metabolites, urine  Cutoff 200 ng/mL Benzodiazepine, urine              Cutoff 200 ng/mL Methadone, urine                   Cutoff 300 ng/mL  The urine drug screen provides only a preliminary, unconfirmed analytical test result and should not be used for non-medical purposes. Clinical consideration and professional judgment should be applied to any positive drug screen result due to possible interfering substances. A more specific alternate chemical method must be used in order to obtain a confirmed analytical result. Gas chromatography / mass spectrometry (GC/MS) is the preferred confirm atory method. Performed at [facility], [address] [place], [place] [address]   HIV Antibody (routine testing w rflx)     Status:  None   Collection Time: 06/11/22  7:14 AM  Result Value Ref Range   HIV Screen 4th Generation wRfx Non Reactive Non Reactive    Comment: Performed at [facility], [address], [place], [place] [address]  Basic metabolic panel     Status: Abnormal   Collection Time: 06/11/22  7:14 AM  Result Value Ref Range   Sodium 137 135 - 145 mmol/L   Potassium 3.8 3.5 - 5.1 mmol/L   Chloride 104 98 - 111 mmol/L   CO2 26 22 - 32 mmol/L   Glucose, Bld 150 (H) 70 - 99 mg/dL    Comment: Glucose reference range applies only to samples taken after fasting for at least 8 hours.   BUN 15 6 - 20 mg/dL   Creatinine, Ser 2.13 0.61 - 1.24 mg/dL   Calcium 8.8 (L) 8.9 - 10.3 mg/dL   GFR, Estimated >08 >65 mL/min    Comment: (NOTE) Calculated using the CKD-EPI Creatinine Equation (2021)    Anion gap 7 5 - 15    Comment: Performed at [facility], [address] [place], [place] [address]  Troponin I (High Sensitivity)  Status: Abnormal   Collection Time: 06/11/22  7:14 AM  Result Value Ref Range   Troponin I (High Sensitivity) 2,687 (HH) <18 ng/L    Comment: CRITICAL VALUE NOTED. VALUE IS CONSISTENT WITH PREVIOUSLY REPORTED/CALLED VALUE KLW (NOTE) Elevated high sensitivity troponin I (hsTnI) values and significant  changes across serial measurements may suggest ACS but many other  chronic and acute conditions are known to elevate hsTnI results.  Refer to the "Links" section for chest pain algorithms and additional  guidance. Performed at [facility], [address] [place], [place] [address]   Heparin level (unfractionated)     Status: None   Collection Time: 06/11/22  7:14 AM  Result Value Ref Range   Heparin Unfractionated 0.42 0.30 - 0.70 IU/mL    Comment: (NOTE) The clinical reportable range upper limit is being lowered to >1.10 to align with the FDA approved guidance for the current laboratory assay.  If heparin results are below expected values, and patient  dosage has  been confirmed, suggest follow up testing of antithrombin III levels. Performed at [facility], [address] [place], [place] [address]   Heparin level (unfractionated)     Status: Abnormal   Collection Time: 06/11/22  3:04 PM  Result Value Ref Range   Heparin Unfractionated 0.27 (L) 0.30 - 0.70 IU/mL    Comment: (NOTE) The clinical reportable range upper limit is being lowered to >1.10 to align with the FDA approved guidance for the current laboratory assay.  If heparin results are below expected values, and patient dosage has  been confirmed, suggest follow up testing of antithrombin III levels. Performed at [facility], [address] [place], [place] [address]    ECHOCARDIOGRAM COMPLETE  Result Date: 06/11/2022    ECHOCARDIOGRAM REPORT   Patient Name:   Christopher Spencer Date of Exam: 06/10/2022 Medical Rec #:  [id number]      Height:       69.0 in Accession #:    [id number]     Weight:       200.0 lb Date of Birth:  1965-09-16      BSA:          2.066 m Patient Age:    57 years       BP:           156/80 mmHg Patient Gender: M              HR:           78 bpm. Exam Location:  [facility] Procedure: 2D Echo, Cardiac Doppler, Color Doppler and Strain Analysis Indications:     R07.9 Chest pain  History:         Patient has prior history of Echocardiogram examinations, most                  recent 12/04/2017. Risk Factors:Hypertension.  Sonographer:     Daphine Deutscher RDCS Referring Phys:  [id number] Verdene Lennert Diagnosing Phys: Julien Nordmann MD IMPRESSIONS  1. Left ventricular ejection fraction, by estimation, is 60 to 65%. The left ventricle has normal function. The left ventricle has no regional wall motion abnormalities. There is moderate left ventricular hypertrophy. Left ventricular diastolic parameters are consistent with Grade II diastolic dysfunction (pseudonormalization).  2. Right ventricular systolic function is normal. The right ventricular size  is normal. There is moderately elevated pulmonary artery systolic pressure. The estimated right ventricular systolic pressure is 54.8 mmHg.  3. Left atrial size was moderately  dilated.  4. The mitral valve is normal in structure. Moderate mitral valve regurgitation. No evidence of mitral stenosis.  5. Tricuspid valve regurgitation is moderate.  6. The aortic valve is normal in structure. There is severe calcifcation of the aortic valve. Aortic valve regurgitation is mild. Severe aortic valve stenosis. Aortic valve area, by VTI measures 0.95 cm. Aortic valve mean gradient measures 41.6 mmHg. Aortic valve Vmax measures 4.18 m/s.  7. The inferior vena cava is normal in size with greater than 50% respiratory variability, suggesting right atrial pressure of 3 mmHg. FINDINGS  Left Ventricle: Left ventricular ejection fraction, by estimation, is 60 to 65%. The left ventricle has normal function. The left ventricle has no regional wall motion abnormalities. The left ventricular internal cavity size was normal in size. There is  moderate left ventricular hypertrophy. Left ventricular diastolic parameters are consistent with Grade II diastolic dysfunction (pseudonormalization). Right Ventricle: The right ventricular size is normal. No increase in right ventricular wall thickness. Right ventricular systolic function is normal. There is moderately elevated pulmonary artery systolic pressure. The tricuspid regurgitant velocity is 3.53 m/s, and with an assumed right atrial pressure of 5 mmHg, the estimated right ventricular systolic pressure is 54.8 mmHg. Left Atrium: Left atrial size was moderately dilated. Right Atrium: Right atrial size was normal in size. Pericardium: There is no evidence of pericardial effusion. Mitral Valve: The mitral valve is normal in structure. Moderate mitral valve regurgitation. No evidence of mitral valve stenosis. Tricuspid Valve: The tricuspid valve is normal in structure. Tricuspid valve  regurgitation is moderate . No evidence of tricuspid stenosis. Aortic Valve: The aortic valve is normal in structure. There is severe calcifcation of the aortic valve. Aortic valve regurgitation is mild. Aortic regurgitation PHT measures 233 msec. Severe aortic stenosis is present. Aortic valve mean gradient measures 41.6 mmHg. Aortic valve peak gradient measures 69.8 mmHg. Aortic valve area, by VTI measures 0.95 cm. Pulmonic Valve: The pulmonic valve was normal in structure. Pulmonic valve regurgitation is not visualized. No evidence of pulmonic stenosis. Aorta: The aortic root is normal in size and structure. Venous: The inferior vena cava is normal in size with greater than 50% respiratory variability, suggesting right atrial pressure of 3 mmHg. IAS/Shunts: No atrial level shunt detected by color flow Doppler.  LEFT VENTRICLE PLAX 2D LVIDd:         4.70 cm   Diastology LVIDs:         3.30 cm   LV e' medial:    7.54 cm/s LV PW:         1.80 cm   LV E/e' medial:  15.2 LV IVS:        1.60 cm   LV e' lateral:   6.31 cm/s LVOT diam:     2.50 cm   LV E/e' lateral: 18.2 LV SV:         74 LV SV Index:   36        2D Longitudinal Strain LVOT Area:     4.91 cm  2D Strain GLS (A2C):   -23.2 %                          2D Strain GLS (A3C):   -23.2 %                          2D Strain GLS (A4C):   -23.2 %  2D Strain GLS Avg:     -23.2 % RIGHT VENTRICLE             IVC RV Basal diam:  4.20 cm     IVC diam: 1.30 cm RV S prime:     14.43 cm/s TAPSE (M-mode): 2.0 cm LEFT ATRIUM             Index        RIGHT ATRIUM           Index LA diam:        5.30 cm 2.57 cm/m   RA Area:     15.30 cm LA Vol (A2C):   97.6 ml 47.24 ml/m  RA Volume:   42.40 ml  20.52 ml/m LA Vol (A4C):   90.6 ml 43.85 ml/m LA Biplane Vol: 99.2 ml 48.02 ml/m  AORTIC VALVE AV Area (Vmax):    0.88 cm AV Area (Vmean):   0.83 cm AV Area (VTI):     0.95 cm AV Vmax:           417.80 cm/s AV Vmean:          299.400 cm/s AV VTI:             0.774 m AV Peak Grad:      69.8 mmHg AV Mean Grad:      41.6 mmHg LVOT Vmax:         75.30 cm/s LVOT Vmean:        50.500 cm/s LVOT VTI:          0.150 m LVOT/AV VTI ratio: 0.19 AI PHT:            233 msec  AORTA Ao Root diam: 3.20 cm Ao Asc diam:  3.40 cm MITRAL VALVE                TRICUSPID VALVE MV Area (PHT): 5.25 cm     TR Peak grad:   49.8 mmHg MV Decel Time: 145 msec     TR Vmax:        353.00 cm/s MR Peak grad: 155.5 mmHg MR Mean grad: 111.5 mmHg    SHUNTS MR Vmax:      623.50 cm/s   Systemic VTI:  0.15 m MR Vmean:     510.5 cm/s    Systemic Diam: 2.50 cm MV E velocity: 115.00 cm/s MV A velocity: 50.90 cm/s MV E/A ratio:  2.26 Julien Nordmann MD Electronically signed by Julien Nordmann MD Signature Date/Time: 06/11/2022/12:14:28 PM    Final    DG Chest 2 View  Result Date: 06/10/2022 CLINICAL DATA:  Chest pain. EXAM: CHEST - 2 VIEW COMPARISON:  Chest x-ray May 3, 23. FINDINGS: The heart size and mediastinal contours are within normal limits. Both lungs are clear. Mild coarsened lung markings appears chronic. No visible pleural effusions or pneumothorax. No acute osseous abnormality. IMPRESSION: No active cardiopulmonary disease. Electronically Signed   By: Feliberto Harts M.D.   On: 06/10/2022 13:15    Pending Labs Unresulted Labs (From admission, onward)     Start     Ordered   06/11/22 2130  Heparin level (unfractionated)  Once-Timed,   TIMED        06/11/22 1558   06/10/22 1600  Hemoglobin A1c  Add-on,   AD        06/10/22 1600            Vitals/Pain Today's Vitals   06/11/22 1600 06/11/22 1615 06/11/22 1630 06/11/22 1649  BP: 136/79     Pulse: 77 76 69   Resp: (!) 22 18 19    Temp:    97.7 F (36.5 C)  TempSrc:    Oral  SpO2: 99% 98% 97%   Weight:      Height:      PainSc:    0-No pain    Isolation Precautions No active isolations  Medications Medications  acetaminophen (TYLENOL) tablet 650 mg (has no administration in time range)  ondansetron (ZOFRAN)  injection 4 mg (has no administration in time range)  nitroGLYCERIN (NITROSTAT) SL tablet 0.4 mg (has no administration in time range)  heparin ADULT infusion 100 units/mL (25000 units/261mL) (1,350 Units/hr Intravenous Rate/Dose Change 06/11/22 1529)  atorvastatin (LIPITOR) tablet 80 mg (80 mg Oral Given 06/11/22 1116)  aspirin chewable tablet 81 mg (81 mg Oral Given 06/11/22 1117)  sodium chloride flush (NS) 0.9 % injection 3 mL (3 mLs Intravenous Given 06/11/22 1106)  carvedilol (COREG) tablet 6.25 mg (6.25 mg Oral Given 06/11/22 1641)  losartan (COZAAR) tablet 25 mg (25 mg Oral Given 06/11/22 1642)  aspirin chewable tablet 324 mg (324 mg Oral Given 06/10/22 1601)  heparin bolus via infusion 1,400 Units (1,400 Units Intravenous Bolus from Bag 06/11/22 1531)    Mobility walks     Focused Assessments Cardiac Assessment Handoff:  Cardiac Rhythm: Normal sinus rhythm (HR 74) Lab Results  Component Value Date   TROPONINI <0.03 02/17/2018   No results found for: "DDIMER" Does the Patient currently have chest pain? No    R Recommendations: See Admitting Provider Note  Report given to:   Additional Notes: cath at 0830 in am, see signed and held/ pre-cath orders

## 2022-06-11 NOTE — Progress Notes (Signed)
[facility]                                                                               Christopher Spencer, is a 57 y.o. male, DOB - 1965-04-30, ZOX:[id number] Admit date - 06/10/2022    Outpatient Primary MD for the patient is Patient, No Pcp Per  LOS - 0  days    Brief summary    Christopher Spencer is a 57 y.o. male with medical history significant of hypertension and substance use disorder, who presents to the ED due to chest pain.   Assessment & Plan    Assessment and Plan:  NSTEMI Patient presents with chest pain and hypertensive urgency, elevated troponins from 6 65-2 687.  EKG shows left ventricular hypertrophy and repolarization abnormalities.  Echocardiogram shows preserved left ventricular ejection fraction but with severe arctic stenosis He was started on IV heparin, aspirin 81 mg and Coreg added Cardiology consulted and he is scheduled for right and left heart catheterization.   Essential hypertension Blood pressure parameters are elevated this morning. Cardiology added Coreg 3.125 mg twice daily, increased it to 6.25 mg twice daily.    Estimated body mass index is 29.53 kg/m as calculated from the following:   Height as of this encounter: 5\' 9"  (1.753 m).   Weight as of this encounter: 90.7 kg.  Code Status: Full code DVT Prophylaxis:  Heparin   Level of Care: Level of care: Telemetry Cardiac Family Communication: None at bedside  Disposition Plan:     Remains inpatient appropriate: Further evaluation with cardiac catheterization  Procedures:  Echocardiogram  Consultants:   Cardiology  Antimicrobials:   Anti-infectives (From admission, onward)    None        Medications  Scheduled Meds:  aspirin  81 mg Oral Daily   atorvastatin  80 mg Oral Daily   carvedilol  3.125 mg Oral BID WC   sodium chloride flush  3 mL Intravenous Q12H   Continuous Infusions:  heparin 1,200 Units/hr (06/11/22 1106)   PRN Meds:.acetaminophen,  nitroGLYCERIN, ondansetron (ZOFRAN) IV    Subjective:   Christopher Spencer was seen and examined today.  No chest pain this afternoon, no nausea vomiting abdominal pain or shortness of breath  Objective:   Vitals:   06/11/22 1030 06/11/22 1045 06/11/22 1100 06/11/22 1109  BP:    (!) 162/96  Pulse: 67 71 76 74  Resp: 18 (!) 21 (!) 24 15  Temp:    97.9 F (36.6 C)  TempSrc:    Oral  SpO2: 99% 100% 100% 100%  Weight:      Height:        Intake/Output Summary (Last 24 hours) at 06/11/2022 1146 Last data filed at 06/11/2022 1117 Gross per 24 hour  Intake --  Output 1325 ml  Net -1325 ml   Filed Weights   06/10/22 1222  Weight: 90.7 kg     Exam General: Alert and oriented x 3, NAD Cardiovascular: S1 S2 auscultated,  RRR Respiratory: Clear to auscultation bilaterally, no wheezing, rales or rhonchi Gastrointestinal: Soft, nontender, nondistended, + bowel sounds Ext: no pedal edema bilaterally Neuro: AAOx3, Cr N's II- XII. Strength 5/5 upper  and lower extremities bilaterally Skin: No rashes Psych: Normal affect and demeanor, alert and oriented x3    Data Reviewed:  I have personally reviewed following labs and imaging studies   CBC Lab Results  Component Value Date   WBC 7.3 06/10/2022   RBC 4.70 06/10/2022   HGB 14.3 06/10/2022   HCT 44.1 06/10/2022   MCV 93.8 06/10/2022   MCH 30.4 06/10/2022   PLT 154 06/10/2022   MCHC 32.4 06/10/2022   RDW 12.5 06/10/2022   LYMPHSABS 2.1 02/17/2018   MONOABS 0.5 02/17/2018   EOSABS 0.2 02/17/2018   BASOSABS 0.1 02/17/2018     Last metabolic panel Lab Results  Component Value Date   NA 137 06/11/2022   K 3.8 06/11/2022   CL 104 06/11/2022   CO2 26 06/11/2022   BUN 15 06/11/2022   CREATININE 0.88 06/11/2022   GLUCOSE 150 (H) 06/11/2022   GFRNONAA >60 06/11/2022   GFRAA >60 02/17/2018   CALCIUM 8.8 (L) 06/11/2022   PROT 6.8 02/17/2018   ALBUMIN 4.2 02/17/2018   BILITOT 1.0 02/17/2018   ALKPHOS 63 02/17/2018   AST  14 (L) 02/17/2018   ALT 15 02/17/2018   ANIONGAP 7 06/11/2022    CBG (last 3)  No results for input(s): "GLUCAP" in the last 72 hours.    Coagulation Profile: No results for input(s): "INR", "PROTIME" in the last 168 hours.   Radiology Studies: DG Chest 2 View  Result Date: 06/10/2022 CLINICAL DATA:  Chest pain. EXAM: CHEST - 2 VIEW COMPARISON:  Chest x-ray May 3, 23. FINDINGS: The heart size and mediastinal contours are within normal limits. Both lungs are clear. Mild coarsened lung markings appears chronic. No visible pleural effusions or pneumothorax. No acute osseous abnormality. IMPRESSION: No active cardiopulmonary disease. Electronically Signed   By: Feliberto Harts M.D.   On: 06/10/2022 13:15       Kathlen Mody M.D. [facility] 06/11/2022, 11:46 AM  Available via Epic secure chat 7am-7pm After 7 pm, please refer to night coverage provider listed on amion.

## 2022-06-11 NOTE — Progress Notes (Addendum)
ANTICOAGULATION CONSULT NOTE - Initial Consult  Pharmacy Consult for  Heparin  Indication: chest pain/ACS  No Known Allergies  Patient Measurements: Height: 5\' 9"  (175.3 cm) Weight: 91.5 kg (201 lb 11.5 oz) IBW/kg (Calculated) : 70.7 Heparin Dosing Weight: 89.1 kg   Vital Signs: Temp: 98 F (36.7 C) (05/23 2116) Temp Source: Oral (05/23 2100) BP: 169/93 (05/23 2116) Pulse Rate: 73 (05/23 2116)  Labs: Recent Labs    06/10/22 1224 06/10/22 1424 06/10/22 2043 06/11/22 0714 06/11/22 1504 06/11/22 2134  HGB 14.3  --   --   --   --   --   HCT 44.1  --   --   --   --   --   PLT 154  --   --   --   --   --   HEPARINUNFRC  --   --   --  0.42 0.27* 0.31  CREATININE 1.03  --   --  0.88  --   --   TROPONINIHS 15 45* 665* 2,687*  --   --      Estimated Creatinine Clearance: 106 mL/min (by C-G formula based on SCr of 0.88 mg/dL).   Medical History: Past Medical History:  Diagnosis Date   Aortic stenosis    a. 11/2017 Echo: EF 60-65%. No rwma, Gr2 DD, mod AS (AVA 1.12 cm^2), mild AI/MR.   Cerebellar stroke (HCC)    a. 11/2017 in setting of drug abuse.   Hypertension    Polysubstance abuse (HCC)    Tobacco abuse     Medications:  No PTA anticoagulation per pharmacist review  Assessment: Pharmacy consulted to dose heparin in this 57 year old male admitted with ACS/NSTEMI.  No PTA anticoag noted.  Pt received lovenox 40 mg SQ X 1 on 5/22 @ 2205. CrCl = 90.2 ml/min   Goal of Therapy:  Heparin level 0.3-0.7 units/ml Monitor platelets by anticoagulation protocol: Yes   5/23 0714 HL 0.42, therapeutic x 1 5/23 1504 HL 0.27, subtherapeutic  5/23 2134 HL 0.31, therapeutic X 1   Plan:  5/23:  HL @ 2134 = 0.31, therapeutic X 1  - Will continue pt on current rate and recheck HL in 6 hrs. Daily CBC while on heparin  Alicha Raspberry D, PharmD 06/11/2022,10:26 PM

## 2022-06-11 NOTE — H&P (View-Only) (Signed)
 Cardiology Consult    Patient ID: Moo E Salomone MRN: [id number], DOB/AGE: 55/26/1967   Admit date: 06/10/2022 Date of Consult: 06/11/2022  Primary Physician: Patient, No Pcp Per Primary Cardiologist: Timothy Gollan, MD Requesting Provider: V. Akula, MD  Patient Profile    Jess E Sedberry is a 57 y.o. male with a history of HTN and polysubstance abuse, who is being seen today for the evaluation of NSTEMI and severe aortic stenosis at the request of Dr. Akula.  Past Medical History   Past Medical History:  Diagnosis Date   Aortic stenosis    a. 11/2017 Echo: EF 60-65%. No rwma, Gr2 DD, mod AS (AVA 1.12 cm^2), mild AI/MR.   Cerebellar stroke (HCC)    a. 11/2017 in setting of drug abuse.   Hypertension    Polysubstance abuse (HCC)    Tobacco abuse     Past Surgical History:  Procedure Laterality Date   CHOLECYSTECTOMY       Allergies  No Known Allergies  History of Present Illness    57 y/o ? w/ a h/o HTN, aortic stenosis, and polysubstance abuse.  In 2019, he was admitted for seizure in the setting of drug use and cerebellar stroke. Echo during that admission showed nl EF w/ grade 2 diast dysfxn and moderate AS.  More recently, he was admitted in 05/2021 w/ chest pain in the setting of cocaine use, that was initially called out as a STEMI, though after review of ECGs (LVH w/ repol abnormalities), STEMI was called off.  HsTrops were minimally elevated w/ flat trend to 37 (felt to be 2/2 vasospasm), and he was conservatively managed by the medicine team.  Pt presented to the [facility] ED on 5/22 due to sudden onset of c/p that occurred a few hours after eating a large meal and became assoc w/ dyspnea.  In the ED, ECG showed sinus w/ LVH and repol abnormalities.  BP 166/102.  Though initial HsTrop was nl @ 15, subsequent values increased (15  45  665  2687).  Tox screen neg.  CXR w/o acute findings.  Echo preliminarily shows nl EF w/ severe aortic stenosis.  We've been asked to eval.   Currently c/p free.  Inpatient Medications     amLODipine  5 mg Oral Daily    Family History    Family History  Problem Relation Age of Onset   Diabetes Father    Heart disease Sister    He indicated that the status of his father is unknown. He indicated that the status of his sister is unknown.   Social History    Social History   Socioeconomic History   Marital status: Single    Spouse name: Not on file   Number of children: Not on file   Years of education: Not on file   Highest education level: Not on file  Occupational History   Not on file  Tobacco Use   Smoking status: Every Day    Packs/day: 0.25    Years: 10.00    Additional pack years: 0.00    Total pack years: 2.50    Types: Cigarettes   Smokeless tobacco: Never  Vaping Use   Vaping Use: Never used  Substance and Sexual Activity   Alcohol use: No   Drug use: Yes    Types: Marijuana, Cocaine    Comment: couple times a month   Sexual activity: Yes    Birth control/protection: Condom  Other Topics Concern   Not on file    Social History Narrative   Not on file   Social Determinants of Health   Financial Resource Strain: High Risk (12/04/2017)   Overall Financial Resource Strain (CARDIA)    Difficulty of Paying Living Expenses: Very hard  Food Insecurity: Food Insecurity Present (12/04/2017)   Hunger Vital Sign    Worried About Running Out of Food in the Last Year: Sometimes true    Ran Out of Food in the Last Year: Sometimes true  Transportation Needs: Unknown (12/04/2017)   PRAPARE - Transportation    Lack of Transportation (Medical): Patient declined    Lack of Transportation (Non-Medical): Patient declined  Physical Activity: Sufficiently Active (12/04/2017)   Exercise Vital Sign    Days of Exercise per Week: 6 days    Minutes of Exercise per Session: 150+ min  Stress: Stress Concern Present (12/04/2017)   [employer] - Occupational Stress Questionnaire     Feeling of Stress : Rather much  Social Connections: Unknown (12/04/2017)   Social Connection and Isolation Panel [NHANES]    Frequency of Communication with Friends and Family: Patient declined    Frequency of Social Gatherings with Friends and Family: Patient declined    Attends Religious Services: Patient declined    Active Member of Clubs or Organizations: Patient declined    Attends Club or Organization Meetings: Patient declined    Marital Status: Patient declined  Intimate Partner Violence: Not At Risk (12/15/2021)   Humiliation, Afraid, Rape, and Kick questionnaire    Fear of Current or Ex-Partner: No    Emotionally Abused: No    Physically Abused: No    Sexually Abused: No     Review of Systems    General:  No chills, fever, night sweats or weight changes.  Cardiovascular:  +++ chest pain, +++ dyspnea on exertion, edema, orthopnea, palpitations, paroxysmal nocturnal dyspnea. Dermatological: No rash, lesions/masses Respiratory: No cough, dyspnea Urologic: No hematuria, dysuria Abdominal:   No nausea, vomiting, diarrhea, bright red blood per rectum, melena, or hematemesis Neurologic:  No visual changes, wkns, changes in mental status. All other systems reviewed and are otherwise negative except as noted above.  Physical Exam    Blood pressure (!) 165/99, pulse 67, temperature 97.6 F (36.4 C), temperature source Oral, resp. rate 18, height 5' 9" (1.753 m), weight 90.7 kg, SpO2 99 %.  General: Pleasant, NAD Psych: Normal affect. Neuro: Alert and oriented X 3. Moves all extremities spontaneously. HEENT: Normal  Neck: Supple without bruits or JVD. Lungs:  Resp regular and unlabored, CTA. Heart: RRR, 3/6 SEM loudest @ the upper sternal borders, no s3, s4, or murmurs. Abdomen: Soft, non-tender, non-distended, BS + x 4.  Extremities: No clubbing, cyanosis or edema. DP/PT2+, Radials 2+ and equal bilaterally.  Labs    Cardiac Enzymes Recent Labs  Lab 06/10/22 1224  06/10/22 1424 06/10/22 2043 06/11/22 0714  TROPONINIHS 15 45* 665* 2,687*     BNP    Component Value Date/Time   BNP 49.0 02/17/2018 1606    Lab Results  Component Value Date   WBC 7.3 06/10/2022   HGB 14.3 06/10/2022   HCT 44.1 06/10/2022   MCV 93.8 06/10/2022   PLT 154 06/10/2022    Recent Labs  Lab 06/11/22 0714  NA 137  K 3.8  CL 104  CO2 26  BUN 15  CREATININE 0.88  CALCIUM 8.8*  GLUCOSE 150*   Lab Results  Component Value Date   CHOL 163 06/10/2022   HDL 50 06/10/2022     LDLCALC 94 06/10/2022   TRIG 95 06/10/2022      Radiology Studies    DG Chest 2 View  Result Date: 06/10/2022 CLINICAL DATA:  Chest pain. EXAM: CHEST - 2 VIEW COMPARISON:  Chest x-ray May 3, 23. FINDINGS: The heart size and mediastinal contours are within normal limits. Both lungs are clear. Mild coarsened lung markings appears chronic. No visible pleural effusions or pneumothorax. No acute osseous abnormality. IMPRESSION: No active cardiopulmonary disease. Electronically Signed   By: Frederick S Jones M.D.   On: 06/10/2022 13:15    ECG & Cardiac Imaging    RSR, 75, LVH, inferolateral ST depression/TWI (similar seen dating back to 05/2009)  - personally reviewed.  Assessment & Plan    1. NSTEMI:  Pt presented w/ chest pain and HTN urgency.  H/o polysubstance abuse and presumed cor vasospasm, though clean x several months and tox screen neg.  ECG w/ LVH and repol abnormalities.  Prelim echo w/ nl EF but severe AS.  Cont heparin and asa. Adding ? blocker, statin.  Plan on R and L heart cath tomorrow.  The patient understands that risks include but are not limited to stroke (1 in 1000), death (1 in 1000), kidney failure [usually temporary] (1 in 500), bleeding (1 in 200), allergic reaction [possibly serious] (1 in 200), and agrees to proceed.    2.  Severe Ao stenosis:  prev moderate on echo in 11/2017.  Echo this AM w/ severe AS.  As above, R & L heart cath tomorrow.  Adding ? blocker.  D/c  amlodipine.  3.  HTN Urgency:  BP still elevated @ 165/99.  Amlodipine added but not ideal in setting of severe AS.  Adding carvedilol.  Avoid vasodilators/afterload reducing agents.  4.  Polysubstance abuse:  off drugs x several months.  Risk Assessment/Risk Scores:     TIMI Risk Score for Unstable Angina or Non-ST Elevation MI:   The patient's TIMI risk score is  , which indicates a  % risk of all cause mortality, new or recurrent myocardial infarction or need for urgent revascularization in the next 14 days.   Signed, Zetha Kuhar, NP 06/11/2022, 10:03 AM  For questions or updates, please contact   Please consult [email or web address] for contact info under Cardiology/STEMI.    

## 2022-06-12 ENCOUNTER — Other Ambulatory Visit: Payer: Self-pay | Admitting: Nurse Practitioner

## 2022-06-12 ENCOUNTER — Encounter: Payer: Self-pay | Admitting: Internal Medicine

## 2022-06-12 ENCOUNTER — Inpatient Hospital Stay: Payer: Self-pay

## 2022-06-12 ENCOUNTER — Encounter
Admission: EM | Disposition: A | Discharge status: Home / Self Care | Payer: Self-pay | Source: Home / Self Care | Attending: Internal Medicine

## 2022-06-12 DIAGNOSIS — I214 Non-ST elevation (NSTEMI) myocardial infarction: Secondary | ICD-10-CM

## 2022-06-12 DIAGNOSIS — I350 Nonrheumatic aortic (valve) stenosis: Secondary | ICD-10-CM

## 2022-06-12 DIAGNOSIS — I110 Hypertensive heart disease with heart failure: Secondary | ICD-10-CM

## 2022-06-12 HISTORY — PX: RIGHT/LEFT HEART CATH AND CORONARY ANGIOGRAPHY: CATH118266

## 2022-06-12 LAB — HEMOGLOBIN A1C
Hgb A1c MFr Bld: 8.1 % — ABNORMAL HIGH (ref 4.8–5.6)
Mean Plasma Glucose: 186 mg/dL

## 2022-06-12 LAB — HEPARIN LEVEL (UNFRACTIONATED): Heparin Unfractionated: 0.34 IU/mL (ref 0.30–0.70)

## 2022-06-12 SURGERY — RIGHT/LEFT HEART CATH AND CORONARY ANGIOGRAPHY
Anesthesia: Moderate Sedation

## 2022-06-12 MED ORDER — HEPARIN SODIUM (PORCINE) 1000 UNIT/ML IJ SOLN
INTRAMUSCULAR | Status: AC
  Filled 2022-06-12: qty 10

## 2022-06-12 MED ORDER — MIDAZOLAM HCL 2 MG/2ML IJ SOLN
INTRAMUSCULAR | Status: AC
  Filled 2022-06-12: qty 2

## 2022-06-12 MED ORDER — ASPIRIN EC 81 MG PO TBEC: 81.0000 mg | DELAYED_RELEASE_TABLET | Freq: Every day | ORAL | 0 refills | Status: DC

## 2022-06-12 MED ORDER — HEPARIN (PORCINE) IN NACL 1000-0.9 UT/500ML-% IV SOLN
INTRAVENOUS | Status: AC
  Filled 2022-06-12: qty 1000

## 2022-06-12 MED ORDER — SODIUM CHLORIDE 0.9 % IV SOLN: 250.0000 mL | INTRAVENOUS | Status: DC | PRN

## 2022-06-12 MED ORDER — LIDOCAINE HCL (PF) 1 % IJ SOLN
INTRAMUSCULAR | Status: DC | PRN
  Administered 2022-06-12: 5 mL via SUBCUTANEOUS

## 2022-06-12 MED ORDER — ATORVASTATIN CALCIUM 80 MG PO TABS: 80.0000 mg | ORAL_TABLET | Freq: Every day | ORAL | 2 refills | Status: DC

## 2022-06-12 MED ORDER — LIDOCAINE HCL 1 % IJ SOLN
INTRAMUSCULAR | Status: AC
  Filled 2022-06-12: qty 20

## 2022-06-12 MED ORDER — VERAPAMIL HCL 2.5 MG/ML IV SOLN
INTRAVENOUS | Status: AC
  Filled 2022-06-12: qty 2

## 2022-06-12 MED ORDER — FENTANYL CITRATE (PF) 100 MCG/2ML IJ SOLN
INTRAMUSCULAR | Status: DC | PRN
  Administered 2022-06-12: 50 ug via INTRAVENOUS

## 2022-06-12 MED ORDER — LOSARTAN POTASSIUM 25 MG PO TABS: 25.0000 mg | ORAL_TABLET | Freq: Every day | ORAL | 2 refills | Status: DC

## 2022-06-12 MED ORDER — IOHEXOL 350 MG/ML SOLN
100.0000 mL | Freq: Once | INTRAVENOUS | Status: AC | PRN
  Administered 2022-06-12: 100 mL via INTRAVENOUS

## 2022-06-12 MED ORDER — SODIUM CHLORIDE 0.9 % IV SOLN: INTRAVENOUS | Status: AC

## 2022-06-12 MED ORDER — MIDAZOLAM HCL 2 MG/2ML IJ SOLN
INTRAMUSCULAR | Status: DC | PRN
  Administered 2022-06-12: 1 mg via INTRAVENOUS

## 2022-06-12 MED ORDER — IOHEXOL 300 MG/ML  SOLN
INTRAMUSCULAR | Status: DC | PRN
  Administered 2022-06-12: 58 mL

## 2022-06-12 MED ORDER — SODIUM CHLORIDE 0.9% FLUSH: 3.0000 mL | Freq: Two times a day (BID) | INTRAVENOUS | Status: DC

## 2022-06-12 MED ORDER — SODIUM CHLORIDE 0.9% FLUSH: 3.0000 mL | INTRAVENOUS | Status: DC | PRN

## 2022-06-12 MED ORDER — CARVEDILOL 6.25 MG PO TABS: 6.2500 mg | ORAL_TABLET | Freq: Two times a day (BID) | ORAL | 2 refills | Status: DC

## 2022-06-12 MED ORDER — HEPARIN (PORCINE) IN NACL 2000-0.9 UNIT/L-% IV SOLN
INTRAVENOUS | Status: DC | PRN
  Administered 2022-06-12: 1000 mL

## 2022-06-12 MED ORDER — HEPARIN SODIUM (PORCINE) 1000 UNIT/ML IJ SOLN
INTRAMUSCULAR | Status: DC | PRN
  Administered 2022-06-12: 4500 [IU] via INTRAVENOUS

## 2022-06-12 MED ORDER — FENTANYL CITRATE (PF) 100 MCG/2ML IJ SOLN
INTRAMUSCULAR | Status: AC
  Filled 2022-06-12: qty 2

## 2022-06-12 MED ORDER — VERAPAMIL HCL 2.5 MG/ML IV SOLN
INTRAVENOUS | Status: DC | PRN
  Administered 2022-06-12: 2.5 mg via INTRAVENOUS

## 2022-06-12 SURGICAL SUPPLY — 13 items
CATH BALLN WEDGE 5F 110CM (CATHETERS) IMPLANT
CATH INFINITI 5FR JK (CATHETERS) IMPLANT
CATH INFINITI JR4 5F (CATHETERS) IMPLANT
DEVICE RAD TR BAND REGULAR (VASCULAR PRODUCTS) IMPLANT
DRAPE BRACHIAL (DRAPES) IMPLANT
GLIDESHEATH SLEND SS 6F .021 (SHEATH) IMPLANT
GUIDEWIRE INQWIRE 1.5J.035X260 (WIRE) IMPLANT
INQWIRE 1.5J .035X260CM (WIRE) ×1
PACK CARDIAC CATH (CUSTOM PROCEDURE TRAY) ×1 IMPLANT
PROTECTION STATION PRESSURIZED (MISCELLANEOUS) ×1
SET ATX-X65L (MISCELLANEOUS) IMPLANT
SHEATH GLIDE SLENDER 4/5FR (SHEATH) IMPLANT
STATION PROTECTION PRESSURIZED (MISCELLANEOUS) IMPLANT

## 2022-06-12 NOTE — Interval H&P Note (Signed)
History and Physical Interval Note:  06/12/2022 9:15 AM  Christopher Spencer  has presented today for surgery, with the diagnosis of nstemi.  The various methods of treatment have been discussed with the patient and family. After consideration of risks, benefits and other options for treatment, the patient has consented to  Procedure(s): RIGHT/LEFT HEART CATH AND CORONARY ANGIOGRAPHY (N/A) as a surgical intervention.  The patient's history has been reviewed, patient examined, no change in status, stable for surgery.  I have reviewed the patient's chart and labs.  Questions were answered to the patient's satisfaction.     Lorine Bears

## 2022-06-12 NOTE — Progress Notes (Signed)
Cardiology Progress Note   Patient Name: Christopher Spencer Date of Encounter: 06/12/2022  Primary Cardiologist: Julien Nordmann, MD  Subjective   Status post catheterization this morning.  No significant coronary disease but severe aortic stenosis.  Currently feels well.  Family member at bedside.  Eager for discharge.  Inpatient Medications    Scheduled Meds:  aspirin  81 mg Oral Daily   atorvastatin  80 mg Oral Daily   carvedilol  6.25 mg Oral BID WC   losartan  25 mg Oral Daily   sodium chloride flush  3 mL Intravenous Q12H   sodium chloride flush  3 mL Intravenous Q12H   Continuous Infusions:  sodium chloride 50 mL/hr at 06/12/22 1048   sodium chloride     PRN Meds: sodium chloride, acetaminophen, nitroGLYCERIN, ondansetron (ZOFRAN) IV, sodium chloride flush   Vital Signs    Vitals:   06/12/22 0911 06/12/22 0959 06/12/22 1047 06/12/22 1100  BP:  (!) 152/91  114/66  Pulse:    71  Resp:  15 14 19   Temp:      TempSrc:      SpO2: 99% 95%  93%  Weight:      Height:        Intake/Output Summary (Last 24 hours) at 06/12/2022 1216 Last data filed at 06/11/2022 1952 Gross per 24 hour  Intake 254.91 ml  Output 1200 ml  Net -945.09 ml   Filed Weights   06/10/22 1222 06/11/22 2205  Weight: 90.7 kg 91.5 kg    Physical Exam   GEN: Well nourished, well developed, in no acute distress.  HEENT: Grossly normal.  Neck: Supple, no JVD, carotid bruits, or masses. Cardiac: RRR, 3/6 systolic ejection murmur heard throughout but loudest at the upper sternal borders.  No rubs or gallops.. No clubbing, cyanosis, edema.  Radials 2+, DP/PT 2+ and equal bilaterally.  Right radial procedure site without bleeding, bruit, or hematoma. Respiratory:  Respirations regular and unlabored, clear to auscultation bilaterally. GI: Soft, nontender, nondistended, BS + x 4. MS: no deformity or atrophy. Skin: warm and dry, no rash. Neuro:  Strength and sensation are intact. Psych: AAOx3.  Normal  affect.  Labs    Chemistry Recent Labs  Lab 06/10/22 1224 06/11/22 0714  NA 137 137  K 4.0 3.8  CL 105 104  CO2 24 26  GLUCOSE 186* 150*  BUN 19 15  CREATININE 1.03 0.88  CALCIUM 9.1 8.8*  GFRNONAA >60 >60  ANIONGAP 8 7     Hematology Recent Labs  Lab 06/10/22 1224  WBC 7.3  RBC 4.70  HGB 14.3  HCT 44.1  MCV 93.8  MCH 30.4  MCHC 32.4  RDW 12.5  PLT 154    Cardiac Enzymes  Recent Labs  Lab 06/10/22 1224 06/10/22 1424 06/10/22 2043 06/11/22 0714  TROPONINIHS 15 45* 665* 2,687*      BNP    Component Value Date/Time   BNP 49.0 02/17/2018 1606   Lipids  Lab Results  Component Value Date   CHOL 163 06/10/2022   HDL 50 06/10/2022   LDLCALC 94 06/10/2022   TRIG 95 06/10/2022   CHOLHDL 3.3 06/10/2022    HbA1c  Lab Results  Component Value Date   HGBA1C 8.1 (H) 06/10/2022    Radiology    DG Chest 2 View  Result Date: 06/10/2022 CLINICAL DATA:  Chest pain. EXAM: CHEST - 2 VIEW COMPARISON:  Chest x-ray May 3, 23. FINDINGS: The heart size and mediastinal contours are within  normal limits. Both lungs are clear. Mild coarsened lung markings appears chronic. No visible pleural effusions or pneumothorax. No acute osseous abnormality. IMPRESSION: No active cardiopulmonary disease. Electronically Signed   By: Feliberto Harts M.D.   On: 06/10/2022 13:15    Telemetry    RSR, PACs - Personally Reviewed  Cardiac Studies   2D Echocardiogram  1. Left ventricular ejection fraction, by estimation, is 60 to 65%. The  left ventricle has normal function. The left ventricle has no regional  wall motion abnormalities. There is moderate left ventricular hypertrophy.  Left ventricular diastolic  parameters are consistent with Grade II diastolic dysfunction  (pseudonormalization).   2. Right ventricular systolic function is normal. The right ventricular  size is normal. There is moderately elevated pulmonary artery systolic  pressure. The estimated right  ventricular systolic pressure is 54.8 mmHg.   3. Left atrial size was moderately dilated.   4. The mitral valve is normal in structure. Moderate mitral valve  regurgitation. No evidence of mitral stenosis.   5. Tricuspid valve regurgitation is moderate.   6. The aortic valve is normal in structure. There is severe calcifcation  of the aortic valve. Aortic valve regurgitation is mild. Severe aortic  valve stenosis. Aortic valve area, by VTI measures 0.95 cm. Aortic valve  mean gradient measures 41.6 mmHg.  Aortic valve Vmax measures 4.18 m/s.   7. The inferior vena cava is normal in size with greater than 50%  respiratory variability, suggesting right atrial pressure of 3 mmHg.  _____________   Cardiac Catheterization  5.24.2024  Diagnostic Dominance: Right     The left ventricular systolic function is normal.   LV end diastolic pressure is normal.   The left ventricular ejection fraction is 55-65% by visual estimate.   1.  Minimal irregularities with no evidence of obstructive coronary artery disease. 2.  Normal LV systolic function. 3.  Severe aortic stenosis with mean gradient of 56 mmHg and valve area of 0.77. 4.  Right heart catheterization showed mildly elevated filling pressures, mild pulmonary hypertension and normal cardiac output.   RA: 9 mmHg RV: 38/5 mmHg PW: 16 mmHg with normal waveforms PA: 36/17 with a mean of 22 mmHg Cardiac output is 5.67 with a cardiac index of 2.74.   Recommendations: Suspect that the patient has bicuspid aortic valve given valve morphology on echo and his relatively young age.  Recommend evaluation for aortic valve replacement. _____________   Patient Profile      57 y.o. male with a history of HTN and prior polysubstance abuse, who was admitted w/ chest pain/NSTEMI, and found to have severe aortic stenosis.  Assessment & Plan    1.  Non-STEMI: Presented with chest pain and hypertensive urgency.  Troponin of 2687.  Diagnostic  catheterization today without significant coronary disease but with severe aortic stenosis.  Continue aspirin, statin, beta-blocker.  2.  Severe aortic stenosis: Echocardiogram this admission with a valve area of 0.95 cm with a mean gradient 41.6 mmHg.  Diagnostic catheterization with minimal nonobstructive CAD and aortic valve area of 0.77 with a mean gradient of 56 mmHg.  Discussed with structural heart team in [place], who have arranged for follow-up with Dr. Cliffton Asters on June 7.  Continue beta-blocker therapy.  3.  Essential hypertension: Currently stable on beta-blocker and ARB.  4.  Hyperlipidemia: LDL 94.  Statin added.  5.  Type 2 diabetes mellitus: A1c 8.1.  Management per medicine team.  Signed, Nicolasa Ducking, NP  06/12/2022, 12:16 PM  For questions or updates, please contact   Please consult [email or web address] for contact info under Cardiology/STEMI.

## 2022-06-12 NOTE — Discharge Summary (Signed)
Physician Discharge Summary   Patient: Christopher Spencer MRN: [id number] DOB: 1965/11/06  Admit date:     06/10/2022  Discharge date: 06/12/22  Discharge Physician: Kathlen Mody   PCP: Patient, No Pcp Per   Recommendations at discharge:  Please follow up with cardiology as recommended.  Please follow up with orthopantogram as outpatient.  Please follow up with cardiothoracic surgery   Discharge Diagnoses: Principal Problem:   Chest pain Active Problems:   Essential hypertension   Non-ST elevation (NSTEMI) myocardial infarction [facility])   Severe aortic valve stenosis   Ventricular hypertrophy   Hypertensive heart disease with heart failure (HCC)   Pulmonary hypertension, unspecified [facility])   Hospital Course:  Christopher Spencer is a 57 y.o. male with medical history significant of hypertension and substance use disorder, who presents to the ED due to chest pain.  Assessment and Plan:  NSTEMI Patient presents with chest pain and hypertensive urgency, elevated troponins from 6 65-2 687.   EKG shows left ventricular hypertrophy and repolarization abnormalities.  Echocardiogram shows preserved left ventricular ejection fraction but with severe arctic stenosis He was started on IV heparin, aspirin 81 mg and Coreg added Cardiology consulted and he underwent  right and left heart catheterization. Cardiac cath showing No significant coronary disease, severe aortic valve stenosis measured with mean gradient 56, valve area estimated 0.77 cm. Normal cardiac output CT surgery outpatient follow up scheduled on June 7 th with dR Lightfoot.  Recommended light work until he is cleared by cardiothoracic surgery.        Essential hypertension Blood pressure parameters better controlled.  Cardiology added Coreg 3.125 mg twice daily, increased it to 6.25 mg twice daily. Added losartan and discharged on the same.        Estimated body mass index is 29.53 kg/m as calculated from the following:    Height as of this encounter: 5\' 9"  (1.753 m).   Weight as of this encounter: 90.7 kg.   Consultants: cardiology. Procedures performed: cardiac cath.   Disposition: Home Diet recommendation:  Discharge Diet Orders (From admission, onward)     Start     Ordered   06/12/22 0000  Diet - low sodium heart healthy        06/12/22 1238           Cardiac diet DISCHARGE MEDICATION: Allergies as of 06/12/2022   No Known Allergies      Medication List     STOP taking these medications    ibuprofen 800 MG tablet Commonly known as: ADVIL   traMADol 50 MG tablet Commonly known as: ULTRAM       TAKE these medications    aspirin EC 81 MG tablet Take 1 tablet (81 mg total) by mouth daily.   atorvastatin 80 MG tablet Commonly known as: LIPITOR Take 1 tablet (80 mg total) by mouth daily. Start taking on: Jun 13, 2022   carvedilol 6.25 MG tablet Commonly known as: COREG Take 1 tablet (6.25 mg total) by mouth 2 (two) times daily with a meal.   losartan 25 MG tablet Commonly known as: COZAAR Take 1 tablet (25 mg total) by mouth daily. Start taking on: Jun 13, 2022        Follow-up Information     Christopher Skains, MD Follow up on 06/26/2022.   Specialty: Cardiothoracic Surgery Why: Your appointment will be at 10:20am. Please arrive toyour appointment at 10am. Contact information: [address] [place] [address] [phone]  Christopher Quint, MD. Call.   Specialty: Oral Surgery Why: If you have not heard from their office 1 week after discharge, please call their office to set up dental evaluation appointment. Contact information: [address] [place] [address] [phone]                Discharge Exam: Ceasar Mons Weights   06/10/22 1222 06/11/22 2205  Weight: 90.7 kg 91.5 kg   General exam: Appears calm and comfortable  Respiratory system: Clear to auscultation. Respiratory effort normal. Cardiovascular system:  S1 & S2 heard, RRR.  Gastrointestinal system: Abdomen is nondistended, soft and nontender. No organomegaly or masses felt. Normal bowel sounds heard. Central nervous system: Alert and oriented. No focal neurological deficits. Extremities: Symmetric 5 x 5 power. Skin: No rashes, lesions or ulcers Psychiatry: Judgement and insight appear normal. Mood & affect appropriate.    Condition at discharge: fair  The results of significant diagnostics from this hospitalization (including imaging, microbiology, ancillary and laboratory) are listed below for reference.   Imaging Studies: CARDIAC CATHETERIZATION  Result Date: 06/12/2022   The left ventricular systolic function is normal.   LV end diastolic pressure is normal.   The left ventricular ejection fraction is 55-65% by visual estimate. 1.  Minimal irregularities with no evidence of obstructive coronary artery disease. 2.  Normal LV systolic function. 3.  Severe aortic stenosis with mean gradient of 56 mmHg and valve area of 0.77. 4.  Right heart catheterization showed mildly elevated filling pressures, mild pulmonary hypertension and normal cardiac output. RA: 9 mmHg RV: 38/5 mmHg PW: 16 mmHg with normal waveforms PA: 36/17 with a mean of 22 mmHg Cardiac output is 5.67 with a cardiac index of 2.74. Recommendations: Suspect that the patient has bicuspid aortic valve given valve morphology on echo and his relatively young age.  Recommend evaluation for aortic valve replacement.   ECHOCARDIOGRAM COMPLETE  Result Date: 06/11/2022    ECHOCARDIOGRAM REPORT   Patient Name:   Christopher Spencer Date of Exam: 06/10/2022 Medical Rec #:  [id number]      Height:       69.0 in Accession #:    [id number]     Weight:       200.0 lb Date of Birth:  28-Apr-1965      BSA:          2.066 m Patient Age:    57 years       BP:           156/80 mmHg Patient Gender: M              HR:           78 bpm. Exam Location:  [facility] Procedure: 2D Echo, Cardiac Doppler, Color Doppler and  Strain Analysis Indications:     R07.9 Chest pain  History:         Patient has prior history of Echocardiogram examinations, most                  recent 12/04/2017. Risk Factors:Hypertension.  Sonographer:     Daphine Deutscher RDCS Referring Phys:  [id number] Verdene Lennert Diagnosing Phys: Julien Nordmann MD IMPRESSIONS  1. Left ventricular ejection fraction, by estimation, is 60 to 65%. The left ventricle has normal function. The left ventricle has no regional wall motion abnormalities. There is moderate left ventricular hypertrophy. Left ventricular diastolic parameters are consistent with Grade II diastolic dysfunction (pseudonormalization).  2. Right ventricular systolic function is normal. The right ventricular  size is normal. There is moderately elevated pulmonary artery systolic pressure. The estimated right ventricular systolic pressure is 54.8 mmHg.  3. Left atrial size was moderately dilated.  4. The mitral valve is normal in structure. Moderate mitral valve regurgitation. No evidence of mitral stenosis.  5. Tricuspid valve regurgitation is moderate.  6. The aortic valve is normal in structure. There is severe calcifcation of the aortic valve. Aortic valve regurgitation is mild. Severe aortic valve stenosis. Aortic valve area, by VTI measures 0.95 cm. Aortic valve mean gradient measures 41.6 mmHg. Aortic valve Vmax measures 4.18 m/s.  7. The inferior vena cava is normal in size with greater than 50% respiratory variability, suggesting right atrial pressure of 3 mmHg. FINDINGS  Left Ventricle: Left ventricular ejection fraction, by estimation, is 60 to 65%. The left ventricle has normal function. The left ventricle has no regional wall motion abnormalities. The left ventricular internal cavity size was normal in size. There is  moderate left ventricular hypertrophy. Left ventricular diastolic parameters are consistent with Grade II diastolic dysfunction (pseudonormalization). Right Ventricle: The  right ventricular size is normal. No increase in right ventricular wall thickness. Right ventricular systolic function is normal. There is moderately elevated pulmonary artery systolic pressure. The tricuspid regurgitant velocity is 3.53 m/s, and with an assumed right atrial pressure of 5 mmHg, the estimated right ventricular systolic pressure is 54.8 mmHg. Left Atrium: Left atrial size was moderately dilated. Right Atrium: Right atrial size was normal in size. Pericardium: There is no evidence of pericardial effusion. Mitral Valve: The mitral valve is normal in structure. Moderate mitral valve regurgitation. No evidence of mitral valve stenosis. Tricuspid Valve: The tricuspid valve is normal in structure. Tricuspid valve regurgitation is moderate . No evidence of tricuspid stenosis. Aortic Valve: The aortic valve is normal in structure. There is severe calcifcation of the aortic valve. Aortic valve regurgitation is mild. Aortic regurgitation PHT measures 233 msec. Severe aortic stenosis is present. Aortic valve mean gradient measures 41.6 mmHg. Aortic valve peak gradient measures 69.8 mmHg. Aortic valve area, by VTI measures 0.95 cm. Pulmonic Valve: The pulmonic valve was normal in structure. Pulmonic valve regurgitation is not visualized. No evidence of pulmonic stenosis. Aorta: The aortic root is normal in size and structure. Venous: The inferior vena cava is normal in size with greater than 50% respiratory variability, suggesting right atrial pressure of 3 mmHg. IAS/Shunts: No atrial level shunt detected by color flow Doppler.  LEFT VENTRICLE PLAX 2D LVIDd:         4.70 cm   Diastology LVIDs:         3.30 cm   LV e' medial:    7.54 cm/s LV PW:         1.80 cm   LV E/e' medial:  15.2 LV IVS:        1.60 cm   LV e' lateral:   6.31 cm/s LVOT diam:     2.50 cm   LV E/e' lateral: 18.2 LV SV:         74 LV SV Index:   36        2D Longitudinal Strain LVOT Area:     4.91 cm  2D Strain GLS (A2C):   -23.2 %                           2D Strain GLS (A3C):   -23.2 %  2D Strain GLS (A4C):   -23.2 %                          2D Strain GLS Avg:     -23.2 % RIGHT VENTRICLE             IVC RV Basal diam:  4.20 cm     IVC diam: 1.30 cm RV S prime:     14.43 cm/s TAPSE (M-mode): 2.0 cm LEFT ATRIUM             Index        RIGHT ATRIUM           Index LA diam:        5.30 cm 2.57 cm/m   RA Area:     15.30 cm LA Vol (A2C):   97.6 ml 47.24 ml/m  RA Volume:   42.40 ml  20.52 ml/m LA Vol (A4C):   90.6 ml 43.85 ml/m LA Biplane Vol: 99.2 ml 48.02 ml/m  AORTIC VALVE AV Area (Vmax):    0.88 cm AV Area (Vmean):   0.83 cm AV Area (VTI):     0.95 cm AV Vmax:           417.80 cm/s AV Vmean:          299.400 cm/s AV VTI:            0.774 m AV Peak Grad:      69.8 mmHg AV Mean Grad:      41.6 mmHg LVOT Vmax:         75.30 cm/s LVOT Vmean:        50.500 cm/s LVOT VTI:          0.150 m LVOT/AV VTI ratio: 0.19 AI PHT:            233 msec  AORTA Ao Root diam: 3.20 cm Ao Asc diam:  3.40 cm MITRAL VALVE                TRICUSPID VALVE MV Area (PHT): 5.25 cm     TR Peak grad:   49.8 mmHg MV Decel Time: 145 msec     TR Vmax:        353.00 cm/s MR Peak grad: 155.5 mmHg MR Mean grad: 111.5 mmHg    SHUNTS MR Vmax:      623.50 cm/s   Systemic VTI:  0.15 m MR Vmean:     510.5 cm/s    Systemic Diam: 2.50 cm MV E velocity: 115.00 cm/s MV A velocity: 50.90 cm/s MV E/A ratio:  2.26 Julien Nordmann MD Electronically signed by Julien Nordmann MD Signature Date/Time: 06/11/2022/12:14:28 PM    Final    DG Chest 2 View  Result Date: 06/10/2022 CLINICAL DATA:  Chest pain. EXAM: CHEST - 2 VIEW COMPARISON:  Chest x-ray May 3, 23. FINDINGS: The heart size and mediastinal contours are within normal limits. Both lungs are clear. Mild coarsened lung markings appears chronic. No visible pleural effusions or pneumothorax. No acute osseous abnormality. IMPRESSION: No active cardiopulmonary disease. Electronically Signed   By: Feliberto Harts M.D.   On:  06/10/2022 13:15    Microbiology: Results for orders placed or performed in visit on 12/15/21  Chlamydia/GC NAA, Confirmation     Status: Abnormal   Collection Time: 12/15/21  3:08 PM   Specimen: Urine   UR  Result Value Ref Range Status   Chlamydia trachomatis, NAA Positive (A) Negative Final  Neisseria gonorrhoeae, NAA Negative Negative Final  C. trachomatis NAA, Confirm     Status: Abnormal   Collection Time: 12/15/21  3:08 PM   UR  Result Value Ref Range Status   C. trachomatis NAA, Confirm Positive (A) Negative Final    Labs: CBC: Recent Labs  Lab 06/10/22 1224  WBC 7.3  HGB 14.3  HCT 44.1  MCV 93.8  PLT 154   Basic Metabolic Panel: Recent Labs  Lab 06/10/22 1224 06/11/22 0714  NA 137 137  K 4.0 3.8  CL 105 104  CO2 24 26  GLUCOSE 186* 150*  BUN 19 15  CREATININE 1.03 0.88  CALCIUM 9.1 8.8*   Liver Function Tests: No results for input(s): "AST", "ALT", "ALKPHOS", "BILITOT", "PROT", "ALBUMIN" in the last 168 hours. CBG: No results for input(s): "GLUCAP" in the last 168 hours.  Discharge time spent: 45 minutes.   Signed: Kathlen Mody, MD [facility] 06/12/2022

## 2022-06-12 NOTE — TOC Initial Note (Signed)
Transition of Care [facility]) - Initial/Assessment Note    Patient Details  Name: Christopher Spencer MRN: [id number] Date of Birth: [birth year]  Transition of Care [facility]) CM/SW Contact:    Truddie Hidden, RN Phone Number: 06/12/2022, 1:02 PM  Clinical Narrative:                 Patient is uninsured. Spoke with him regarding  insurance.  Patient advised to follow up with DSS.         Patient Goals and CMS Choice            Expected Discharge Plan and Services         Expected Discharge Date: 06/12/22                                    Prior Living Arrangements/Services                       Activities of Daily Living Home Assistive Devices/Equipment: None ADL Screening (condition at time of admission) Patient's cognitive ability adequate to safely complete daily activities?: Yes Is the patient deaf or have difficulty hearing?: No Does the patient have difficulty seeing, even when wearing glasses/contacts?: No Does the patient have difficulty concentrating, remembering, or making decisions?: No Patient able to express need for assistance with ADLs?: Yes Does the patient have difficulty dressing or bathing?: No Independently performs ADLs?: Yes (appropriate for developmental age) Does the patient have difficulty walking or climbing stairs?: No Weakness of Legs: None Weakness of Arms/Hands: None  Permission Sought/Granted                  Emotional Assessment              Admission diagnosis:  Chest pain [R07.9] Chest pain, unspecified type [R07.9] Patient Active Problem List   Diagnosis Date Noted   Non-ST elevation (NSTEMI) myocardial infarction (HCC) 06/11/2022   Severe aortic valve stenosis 06/11/2022   Ventricular hypertrophy 06/11/2022   Hypertensive heart disease with heart failure (HCC) 06/11/2022   Pulmonary hypertension, unspecified (HCC) 06/11/2022   Chest pain 05/22/2021   Cocaine abuse (HCC) 05/22/2021   Essential hypertension  05/22/2021   Hypokalemia 05/22/2021   Does not have primary care provider 05/22/2021   Seizure (HCC) 12/04/2017   PCP:  Patient, No Pcp Per Pharmacy:   [facility] [address], [place] - [address] [place] [address] Phone: [phone] Fax: [phone]  [facility] [address] ([address]), [place] - [address] [place]) [place] [address] Phone: [phone] Fax: [phone]  [facility] [address] [place] [address] Phone: [phone] Fax: [phone]     Social Determinants of Health (SDOH) Social History: SDOH Screenings   Food Insecurity: No Food Insecurity (06/11/2022)  Housing: High Risk (06/11/2022)  Transportation Needs: No Transportation Needs (06/11/2022)  Utilities: Not At Risk (06/11/2022)  Financial Resource Strain: High Risk (12/04/2017)  Physical Activity: Sufficiently Active (12/04/2017)  Social Connections: Unknown (12/04/2017)  Stress: Stress Concern Present (12/04/2017)  Tobacco Use: High Risk (06/11/2022)   SDOH Interventions:     Readmission Risk Interventions     No data to display

## 2022-06-12 NOTE — Progress Notes (Signed)
ANTICOAGULATION CONSULT NOTE - Initial Consult  Pharmacy Consult for  Heparin  Indication: chest pain/ACS  No Known Allergies  Patient Measurements: Height: 5\' 9"  (175.3 cm) Weight: 91.5 kg (201 lb 11.5 oz) IBW/kg (Calculated) : 70.7 Heparin Dosing Weight: 89.1 kg   Vital Signs: Temp: 98 F (36.7 C) (05/24 0405) Temp Source: Oral (05/23 2100) BP: 132/73 (05/24 0405) Pulse Rate: 61 (05/24 0405)  Labs: Recent Labs    06/10/22 1224 06/10/22 1424 06/10/22 2043 06/11/22 0714 06/11/22 0714 06/11/22 1504 06/11/22 2134 06/12/22 0543  HGB 14.3  --   --   --   --   --   --   --   HCT 44.1  --   --   --   --   --   --   --   PLT 154  --   --   --   --   --   --   --   HEPARINUNFRC  --   --   --  0.42   < > 0.27* 0.31 0.34  CREATININE 1.03  --   --  0.88  --   --   --   --   TROPONINIHS 15 45* 665* 2,687*  --   --   --   --    < > = values in this interval not displayed.     Estimated Creatinine Clearance: 106 mL/min (by C-G formula based on SCr of 0.88 mg/dL).   Medical History: Past Medical History:  Diagnosis Date   Aortic stenosis    a. 11/2017 Echo: EF 60-65%. No rwma, Gr2 DD, mod AS (AVA 1.12 cm^2), mild AI/MR.   Cerebellar stroke (HCC)    a. 11/2017 in setting of drug abuse.   Hypertension    Polysubstance abuse (HCC)    Tobacco abuse     Medications:  No PTA anticoagulation per pharmacist review  Assessment: Pharmacy consulted to dose heparin in this 57 year old male admitted with ACS/NSTEMI.  No PTA anticoag noted.  Pt received lovenox 40 mg SQ X 1 on 5/22 @ 2205. CrCl = 90.2 ml/min   Goal of Therapy:  Heparin level 0.3-0.7 units/ml Monitor platelets by anticoagulation protocol: Yes   5/23 0714 HL 0.42, therapeutic x 1 5/23 1504 HL 0.27, subtherapeutic  5/23 2134 HL 0.31, therapeutic X 1  5/24 0543 HL 0.34, therapeutic X 2  Plan:  5/24:  HL @ 0543 = 0.34, therapeutic X 2 - Will continue pt on current rate and recheck HL on 5/25 with AM labs.   Daily CBC while on heparin  Joseline Mccampbell D, PharmD 06/12/2022,6:35 AM

## 2022-06-12 NOTE — Progress Notes (Signed)
Ordered placed for CT surgical referral - severe AS s/p hospitalization/cath 5/24.2024.

## 2022-06-16 ENCOUNTER — Encounter: Payer: Self-pay | Admitting: Cardiovascular Disease

## 2022-06-16 LAB — POCT I-STAT EG7
Acid-Base Excess: 2 mmol/L (ref 0.0–2.0)
Bicarbonate: 29.2 mmol/L — ABNORMAL HIGH (ref 20.0–28.0)
Calcium, Ion: 1.27 mmol/L (ref 1.15–1.40)
HCT: 48 % (ref 39.0–52.0)
Hemoglobin: 16.3 g/dL (ref 13.0–17.0)
O2 Saturation: 68 %
Potassium: 4.2 mmol/L (ref 3.5–5.1)
Sodium: 138 mmol/L (ref 135–145)
TCO2: 31 mmol/L (ref 22–32)
pCO2, Ven: 53.2 mmHg (ref 44–60)
pH, Ven: 7.347 (ref 7.25–7.43)
pO2, Ven: 38 mmHg (ref 32–45)

## 2022-06-16 LAB — POCT I-STAT 7, (LYTES, BLD GAS, ICA,H+H)
Acid-Base Excess: 1 mmol/L (ref 0.0–2.0)
Bicarbonate: 27.1 mmol/L (ref 20.0–28.0)
Calcium, Ion: 1.15 mmol/L (ref 1.15–1.40)
HCT: 45 % (ref 39.0–52.0)
Hemoglobin: 15.3 g/dL (ref 13.0–17.0)
O2 Saturation: 93 %
Potassium: 4 mmol/L (ref 3.5–5.1)
Sodium: 139 mmol/L (ref 135–145)
TCO2: 28 mmol/L (ref 22–32)
pCO2 arterial: 47.1 mmHg (ref 32–48)
pH, Arterial: 7.367 (ref 7.35–7.45)
pO2, Arterial: 70 mmHg — ABNORMAL LOW (ref 83–108)

## 2022-06-26 ENCOUNTER — Encounter: Payer: Self-pay | Admitting: Thoracic Surgery (Cardiothoracic Vascular Surgery)

## 2022-06-26 ENCOUNTER — Institutional Professional Consult (permissible substitution) (INDEPENDENT_AMBULATORY_CARE_PROVIDER_SITE_OTHER): Payer: Self-pay | Admitting: Thoracic Surgery (Cardiothoracic Vascular Surgery)

## 2022-06-26 VITALS — BP 136/77 | HR 68 | Resp 20 | Ht 69.0 in | Wt 208.0 lb

## 2022-06-26 DIAGNOSIS — I350 Nonrheumatic aortic (valve) stenosis: Secondary | ICD-10-CM

## 2022-06-26 NOTE — Progress Notes (Signed)
[address]       [place] [address]             [phone]        Christopher Spencer Medical Record #[id number] Date of Birth: [birth year]  Referring: Christopher Iba, MD Primary Care: Patient, No Pcp Per Primary Cardiologist:Christopher Mariah Milling, MD  Chief Complaint:    Chief Complaint  Patient presents with   Aortic Stenosis    New patient consultation ECHO 5/22, CATH 5/24, CTA chest 5/24     History of Present Illness:     57 year old male presents for surgical evaluation of severe aortic valve stenosis.  He originally presented with an NSTEMI but was noted to have nonobstructive coronary disease.  His echo was consistent elevated gradient.  This was his first episode of chest pain.  He has had progressive shortness of breath over the past several months.    Past Medical History:  Diagnosis Date   Aortic stenosis    a. 11/2017 Echo: EF 60-65%. No rwma, Gr2 DD, mod AS (AVA 1.12 cm^2), mild AI/MR.   Cerebellar stroke (HCC)    a. 11/2017 in setting of drug abuse.   Hypertension    Polysubstance abuse (HCC)    Tobacco abuse     Past Surgical History:  Procedure Laterality Date   CHOLECYSTECTOMY     RIGHT/LEFT HEART CATH AND CORONARY ANGIOGRAPHY N/A 06/12/2022   Procedure: RIGHT/LEFT HEART CATH AND CORONARY ANGIOGRAPHY;  Surgeon: Christopher Ouch, MD;  Location: [facility] INVASIVE CV LAB;  Service: Cardiovascular;  Laterality: N/A;    Social History: Support: He presents today with his sister.  Social History   Tobacco Use  Smoking Status Every Day   Packs/day: 0.25   Years: 10.00   Additional pack years: 0.00   Total pack years: 2.50   Types: Cigarettes  Smokeless Tobacco Never    Social History   Substance and Sexual Activity  Alcohol Use No     No Known Allergies    Current Outpatient Medications  Medication Sig Dispense Refill   aspirin EC 81 MG tablet Take 1 tablet (81 mg total) by mouth daily. 180 tablet 0    atorvastatin (LIPITOR) 80 MG tablet Take 1 tablet (80 mg total) by mouth daily. 30 tablet 2   carvedilol (COREG) 6.25 MG tablet Take 1 tablet (6.25 mg total) by mouth 2 (two) times daily with a meal. 60 tablet 2   losartan (COZAAR) 25 MG tablet Take 1 tablet (25 mg total) by mouth daily. 30 tablet 2   No current facility-administered medications for this visit.    (Not in a hospital admission)   Family History  Problem Relation Age of Onset   Diabetes Father    Heart disease Sister      Review of Systems:   Review of Systems  Constitutional:  Positive for malaise/fatigue.  Respiratory:  Positive for shortness of breath.   Cardiovascular:  Positive for chest pain. Negative for palpitations, orthopnea and leg swelling.  Neurological: Negative.       Physical Exam: BP 136/77 (BP Location: Left Arm, Patient Position: Sitting, Cuff Size: Normal)   Pulse 68   Resp 20   Ht 5\' 9"  (1.753 m)   Wt 208 lb (94.3 kg)   SpO2 99% Comment: RA  BMI 30.72 kg/m  Physical Exam HENT:     Head: Normocephalic and atraumatic.  Eyes:     Extraocular Movements: Extraocular movements intact.  Cardiovascular:     Rate and Rhythm: Normal rate.  Pulmonary:     Effort: Pulmonary effort is normal. No respiratory distress.  Abdominal:     General: Abdomen is flat. There is no distension.  Musculoskeletal:        General: Normal range of motion.  Skin:    General: Skin is warm and dry.  Neurological:     General: No focal deficit present.     Mental Status: He is alert and oriented to person, place, and time.       Diagnostic Studies & Laboratory data:    Left Heart Catherization: Nonobstructive coronary disease. Echo:  1. Left ventricular ejection fraction, by estimation, is 60 to 65%. The  left ventricle has normal function. The left ventricle has no regional  wall motion abnormalities. There is moderate left ventricular hypertrophy.  Left ventricular diastolic  parameters are  consistent with Grade II diastolic dysfunction  (pseudonormalization).   2. Right ventricular systolic function is normal. The right ventricular  size is normal. There is moderately elevated pulmonary artery systolic  pressure. The estimated right ventricular systolic pressure is 54.8 mmHg.   3. Left atrial size was moderately dilated.   4. The mitral valve is normal in structure. Moderate mitral valve  regurgitation. No evidence of mitral stenosis.   5. Tricuspid valve regurgitation is moderate.   6. The aortic valve is normal in structure. There is severe calcifcation  of the aortic valve. Aortic valve regurgitation is mild. Severe aortic  valve stenosis. Aortic valve area, by VTI measures 0.95 cm. Aortic valve  mean gradient measures 41.6 mmHg.  Aortic valve Vmax measures 4.18 m/s.   7. The inferior vena cava is normal in size with greater than 50%  respiratory variability, suggesting right atrial pressure of 3 mmHg.   EKG: Sinus I have independently reviewed the above radiologic studies and discussed with the patient   Recent Lab Findings: Lab Results  Component Value Date   WBC 7.3 06/10/2022   HGB 15.3 06/12/2022   HCT 45.0 06/12/2022   PLT 154 06/10/2022   GLUCOSE 150 (H) 06/11/2022   CHOL 163 06/10/2022   TRIG 95 06/10/2022   HDL 50 06/10/2022   LDLCALC 94 06/10/2022   ALT 15 02/17/2018   AST 14 (L) 02/17/2018   NA 139 06/12/2022   K 4.0 06/12/2022   CL 104 06/11/2022   CREATININE 0.88 06/11/2022   BUN 15 06/11/2022   CO2 26 06/11/2022   TSH 0.552 12/04/2017   INR 0.87 02/17/2018   HGBA1C 8.1 (H) 06/10/2022      Assessment / Plan:   57 year old male with severe aortic valve stenosis, and moderate mitral valve regurgitation.  On review of his echocardiogram the mitral valve regurgitation appears central.  He does not have any significant coronary artery disease.  We discussed the risks and benefits of surgical aortic valve replacement.  He is unsure whether he  wants to proceed with mechanical or bioprosthetic valve.  He still requires dental clearance.  Once completed we will schedule him for surgery.     I  spent 40 minutes counseling the patient face to face.   Christopher Spencer 06/26/2022 3:47 PM

## 2022-07-16 ENCOUNTER — Other Ambulatory Visit: Payer: Self-pay | Admitting: *Deleted

## 2022-07-16 ENCOUNTER — Encounter: Payer: Self-pay | Admitting: *Deleted

## 2022-07-16 DIAGNOSIS — I350 Nonrheumatic aortic (valve) stenosis: Secondary | ICD-10-CM

## 2022-07-22 NOTE — Pre-Procedure Instructions (Signed)
Surgical Instructions    Your procedure is scheduled on Tuesday, July 9th.  Report to [facility] Main Entrance "A" at 05:30 A.M., then check in with the Admitting office.  Call this number if you have problems the morning of surgery:  [phone]  If you have any questions prior to your surgery date call [phone]: Open Monday-Friday 8am-4pm If you experience any cold or flu symptoms such as cough, fever, chills, shortness of breath, etc. between now and your scheduled surgery, please notify us at the above number.     Remember:  Do not eat or drink after midnight the night before your surgery      Take these medicines the morning of surgery with A SIP OF WATER  carvedilol (COREG)   As of today, STOP taking any Aleve, Naproxen, Ibuprofen, Motrin, Advil, Goody's, BC's, all herbal medications, fish oil, and all vitamins.                     Do NOT Smoke (Tobacco/Vaping) for 24 hours prior to your procedure.  If you use a CPAP at night, you may bring your mask/headgear for your overnight stay.   Contacts, glasses, piercing's, hearing aid's, dentures or partials may not be worn into surgery, please bring cases for these belongings.    For patients admitted to the hospital, discharge time will be determined by your treatment team.   Patients discharged the day of surgery will not be allowed to drive home, and someone needs to stay with them for 24 hours.  SURGICAL WAITING ROOM VISITATION Patients having surgery or a procedure may have no more than 2 support people in the waiting area - these visitors may rotate.   Children under the age of 58 must have an adult with them who is not the patient. If the patient needs to stay at the hospital during part of their recovery, the visitor guidelines for inpatient rooms apply. Pre-op nurse will coordinate an appropriate time for 1 support person to accompany patient in pre-op.  This support person may not rotate.   Please refer to the  [facility] website for the visitor guidelines for Inpatients (after your surgery is over and you are in a regular room).    Special instructions:   [place]- Preparing For Surgery  Before surgery, you can play an important role. Because skin is not sterile, your skin needs to be as free of germs as possible. You can reduce the number of germs on your skin by washing with CHG (chlorahexidine gluconate) Soap before surgery.  CHG is an antiseptic cleaner which kills germs and bonds with the skin to continue killing germs even after washing.    Oral Hygiene is also important to reduce your risk of infection.  Remember - BRUSH YOUR TEETH THE MORNING OF SURGERY WITH YOUR REGULAR TOOTHPASTE  Please do not use if you have an allergy to CHG or antibacterial soaps. If your skin becomes reddened/irritated stop using the CHG.  Do not shave (including legs and underarms) for at least 48 hours prior to first CHG shower. It is OK to shave your face.  Please follow these instructions carefully.   Shower the NIGHT BEFORE SURGERY and the MORNING OF SURGERY  If you chose to wash your hair, wash your hair first as usual with your normal shampoo.  After you shampoo, rinse your hair and body thoroughly to remove the shampoo.  Use CHG Soap as you would any other liquid soap. You can  apply CHG directly to the skin and wash gently with a scrungie or a clean washcloth.   Apply the CHG Soap to your body ONLY FROM THE NECK DOWN.  Do not use on open wounds or open sores. Avoid contact with your eyes, ears, mouth and genitals (private parts). Wash Face and genitals (private parts)  with your normal soap.   Wash thoroughly, paying special attention to the area where your surgery will be performed.  Thoroughly rinse your body with warm water from the neck down.  DO NOT shower/wash with your normal soap after using and rinsing off the CHG Soap.  Pat yourself dry with a CLEAN TOWEL.  Wear CLEAN PAJAMAS to bed the  night before surgery  Place CLEAN SHEETS on your bed the night before your surgery  DO NOT SLEEP WITH PETS.   Day of Surgery: Take a shower with CHG soap. Do not wear jewelry or makeup Do not wear lotions, powders, perfumes/colognes, or deodorant. Do not shave 48 hours prior to surgery.  Men may shave face and neck. Do not bring valuables to the hospital.  [facility] is not responsible for any belongings or valuables. Do not wear nail polish, gel polish, artificial nails, or any other type of covering on natural nails (fingers and toes) If you have artificial nails or gel coating that need to be removed by a nail salon, please have this removed prior to surgery. Artificial nails or gel coating may interfere with anesthesia's ability to adequately monitor your vital signs. Wear Clean/Comfortable clothing the morning of surgery Remember to brush your teeth WITH YOUR REGULAR TOOTHPASTE.   Please read over the following fact sheets that you were given.    If you received a COVID test during your pre-op visit  it is requested that you wear a mask when out in public, stay away from anyone that may not be feeling well and notify your surgeon if you develop symptoms. If you have been in contact with anyone that has tested positive in the last 10 days please notify you surgeon.

## 2022-07-24 ENCOUNTER — Other Ambulatory Visit: Payer: Self-pay

## 2022-07-24 ENCOUNTER — Ambulatory Visit (HOSPITAL_COMMUNITY): Payer: Medicaid Other

## 2022-07-24 ENCOUNTER — Telehealth: Payer: Self-pay | Admitting: *Deleted

## 2022-07-24 ENCOUNTER — Encounter (HOSPITAL_COMMUNITY)
Admission: RE | Admit: 2022-07-24 | Discharge: 2022-07-24 | Disposition: A | Discharge status: Home / Self Care | Payer: Medicaid Other | Source: Ambulatory Visit | Attending: Thoracic Surgery (Cardiothoracic Vascular Surgery) | Admitting: Thoracic Surgery (Cardiothoracic Vascular Surgery)

## 2022-07-24 ENCOUNTER — Encounter (HOSPITAL_COMMUNITY): Payer: Self-pay

## 2022-07-24 ENCOUNTER — Ambulatory Visit (HOSPITAL_COMMUNITY)
Admission: RE | Admit: 2022-07-24 | Discharge: 2022-07-24 | Disposition: A | Discharge status: Home / Self Care | Payer: Self-pay | Source: Ambulatory Visit | Attending: Thoracic Surgery (Cardiothoracic Vascular Surgery) | Admitting: Thoracic Surgery (Cardiothoracic Vascular Surgery)

## 2022-07-24 ENCOUNTER — Other Ambulatory Visit: Payer: Self-pay | Admitting: *Deleted

## 2022-07-24 VITALS — BP 150/79 | HR 72 | Temp 98.3°F | Resp 18 | Ht 69.0 in | Wt 210.9 lb

## 2022-07-24 DIAGNOSIS — I350 Nonrheumatic aortic (valve) stenosis: Secondary | ICD-10-CM | POA: Diagnosis not present

## 2022-07-24 DIAGNOSIS — Z01818 Encounter for other preprocedural examination: Secondary | ICD-10-CM | POA: Insufficient documentation

## 2022-07-24 HISTORY — DX: Personal history of urinary calculi: Z87.442

## 2022-07-24 HISTORY — DX: Unspecified osteoarthritis, unspecified site: M19.90

## 2022-07-24 HISTORY — DX: Cardiac murmur, unspecified: R01.1

## 2022-07-24 NOTE — Progress Notes (Signed)
While going over pre-op CHG instructions, RN mentioned cleaning chest well since that is where incision will be. Pt stated that Dr. Cliffton Asters said that he would not be approaching through the chest, but placing a tissue valve via the femoral artery. RN discussed this with Gonzella Lex, RN with Dr. Lucilla Lame office due to OR posting having OHS. Pt stated that he only wanted to continue with the surgery if he could have the femoral approach, not through the chest.   After discussion with surgeon, decision made to cancel surgery for next Tuesday and pt will have a follow-up appointment with Dr. Cliffton Asters Friday, July 12th to discuss the surgery further.   No lab work/testing completed. All questions answered.

## 2022-07-24 NOTE — Telephone Encounter (Signed)
[name] contacted our office stating patient did not want open AVR as scheduled for 7/9. States he only wants TAVR. Surgery with Dr. Cliffton Asters cancelled. Follow up appt scheduled for 7/12 to further discuss.

## 2022-07-28 ENCOUNTER — Inpatient Hospital Stay (HOSPITAL_COMMUNITY)
Admission: RE | Admit: 2022-07-28 | Payer: Self-pay | Source: Home / Self Care | Admitting: Thoracic Surgery (Cardiothoracic Vascular Surgery)

## 2022-07-28 SURGERY — REPLACEMENT, AORTIC VALVE, OPEN
Anesthesia: General | Site: Chest

## 2022-07-31 ENCOUNTER — Ambulatory Visit (INDEPENDENT_AMBULATORY_CARE_PROVIDER_SITE_OTHER): Payer: Medicaid Other | Admitting: Thoracic Surgery (Cardiothoracic Vascular Surgery)

## 2022-07-31 ENCOUNTER — Other Ambulatory Visit: Payer: Self-pay | Admitting: *Deleted

## 2022-07-31 ENCOUNTER — Encounter: Payer: Self-pay | Admitting: *Deleted

## 2022-07-31 VITALS — BP 165/92 | HR 71 | Resp 20 | Ht 69.0 in | Wt 209.0 lb

## 2022-07-31 DIAGNOSIS — I350 Nonrheumatic aortic (valve) stenosis: Secondary | ICD-10-CM | POA: Diagnosis not present

## 2022-08-02 NOTE — H&P (View-Only) (Signed)
?? [address]       [place] [address]             [phone]                                        Christopher Spencer Jaconita Medical Record #[id number] Date of Birth: 04/28/1965   Referring: Antonieta Iba, MD Primary Care: Patient, No Pcp Per Primary Cardiologist:Timothy Mariah Milling, MD   Chief Complaint:    Chief Complaint Patient presents with  Aortic Stenosis     New patient consultation ECHO 5/22, CATH 5/24, CTA chest 5/24      History of Present Illness:     57 year old male presents for surgical evaluation of severe aortic valve stenosis.  He originally presented with an NSTEMI but was noted to have nonobstructive coronary disease.  His echo was consistent elevated gradient.  This was his first episode of chest pain.  He has had progressive shortness of breath over the past several months.       Past Medical History: Diagnosis Date  Aortic stenosis     a. 11/2017 Echo: EF 60-65%. No rwma, Gr2 DD, mod AS (AVA 1.12 cm^2), mild AI/MR.  Cerebellar stroke (HCC)     a. 11/2017 in setting of drug abuse.  Hypertension    Polysubstance abuse (HCC)    Tobacco abuse         Past Surgical History: Procedure Laterality Date  CHOLECYSTECTOMY      RIGHT/LEFT HEART CATH AND CORONARY ANGIOGRAPHY N/A 06/12/2022   Procedure: RIGHT/LEFT HEART CATH AND CORONARY ANGIOGRAPHY;  Surgeon: Iran Ouch, MD;  Location: [facility] INVASIVE CV LAB;  Service: Cardiovascular;  Laterality: N/A;       Social History: Support: He presents today with his sister.   Tobacco Use History Social History    Tobacco Use Smoking Status Every Day  Packs/day: 0.25  Years: 10.00  Additional pack years: 0.00  Total pack years: 2.50  Types: Cigarettes Smokeless Tobacco Never    Social History   Substance and Sexual Activity Alcohol Use No       Allergies No Known Allergies        Current Outpatient Medications Medication Sig Dispense Refill  aspirin EC 81 MG  tablet Take 1 tablet (81 mg total) by mouth daily. 180 tablet 0  atorvastatin (LIPITOR) 80 MG tablet Take 1 tablet (80 mg total) by mouth daily. 30 tablet 2  carvedilol (COREG) 6.25 MG tablet Take 1 tablet (6.25 mg total) by mouth 2 (two) times daily with a meal. 60 tablet 2  losartan (COZAAR) 25 MG tablet Take 1 tablet (25 mg total) by mouth daily. 30 tablet 2     No current facility-administered medications for this visit.       (Not in a hospital admission)       Family History Problem Relation Age of Onset  Diabetes Father    Heart disease Sister           Review of Systems:    Review of Systems  Constitutional:  Positive for malaise/fatigue.  Respiratory:  Positive for shortness of breath.   Cardiovascular:  Positive for chest pain. Negative for palpitations, orthopnea and leg swelling.  Neurological: Negative.                    Physical Exam: BP 136/77 (BP Location:  Left Arm, Patient Position: Sitting, Cuff Size: Normal)   Pulse 68   Resp 20   Ht 5\' 9"  (1.753 m)   Wt 208 lb (94.3 kg)   SpO2 99% Comment: RA  BMI 30.72 kg/m  Physical Exam HENT:     Head: Normocephalic and atraumatic.  Eyes:     Extraocular Movements: Extraocular movements intact.  Cardiovascular:     Rate and Rhythm: Normal rate.  Pulmonary:     Effort: Pulmonary effort is normal. No respiratory distress.  Abdominal:     General: Abdomen is flat. There is no distension.  Musculoskeletal:        General: Normal range of motion.  Skin:    General: Skin is warm and dry.  Neurological:     General: No focal deficit present.     Mental Status: He is alert and oriented to person, place, and time.          Diagnostic Studies & Laboratory data:    Left Heart Catherization: Nonobstructive coronary disease. Echo:  1. Left ventricular ejection fraction, by estimation, is 60 to 65%. The  left ventricle has normal function. The left ventricle has no regional  wall motion  abnormalities. There is moderate left ventricular hypertrophy.  Left ventricular diastolic  parameters are consistent with Grade II diastolic dysfunction  (pseudonormalization).   2. Right ventricular systolic function is normal. The right ventricular  size is normal. There is moderately elevated pulmonary artery systolic  pressure. The estimated right ventricular systolic pressure is 54.8 mmHg.   3. Left atrial size was moderately dilated.   4. The mitral valve is normal in structure. Moderate mitral valve  regurgitation. No evidence of mitral stenosis.   5. Tricuspid valve regurgitation is moderate.   6. The aortic valve is normal in structure. There is severe calcifcation  of the aortic valve. Aortic valve regurgitation is mild. Severe aortic  valve stenosis. Aortic valve area, by VTI measures 0.95 cm. Aortic valve  mean gradient measures 41.6 mmHg.  Aortic valve Vmax measures 4.18 m/s.   7. The inferior vena cava is normal in size with greater than 50%  respiratory variability, suggesting right atrial pressure of 3 mmHg.    EKG: Sinus I have independently reviewed the above radiologic studies and discussed with the patient    Recent Lab Findings: Recent Labs Lab Results Component Value Date   WBC 7.3 06/10/2022   HGB 15.3 06/12/2022   HCT 45.0 06/12/2022   PLT 154 06/10/2022   GLUCOSE 150 (H) 06/11/2022   CHOL 163 06/10/2022   TRIG 95 06/10/2022   HDL 50 06/10/2022   LDLCALC 94 06/10/2022   ALT 15 02/17/2018   AST 14 (L) 02/17/2018   NA 139 06/12/2022   K 4.0 06/12/2022   CL 104 06/11/2022   CREATININE 0.88 06/11/2022   BUN 15 06/11/2022   CO2 26 06/11/2022   TSH 0.552 12/04/2017   INR 0.87 02/17/2018   HGBA1C 8.1 (H) 06/10/2022          Assessment / Plan:   57 year old male with severe aortic valve stenosis, and moderate mitral valve regurgitation.  On review of his echocardiogram the mitral valve regurgitation appears central.  He does not have any  significant coronary artery disease.  We discussed the risks and benefits of surgical aortic valve replacement.  He was confused initially because he thought that a bioprosthetic valve meant that he was going to get a TAVR.  This has been clarified.  He  would like to proceed with with a mechanical valve replacement.          I  spent 40 minutes counseling the patient face to face.     Harrell Keane Scrape

## 2022-08-02 NOTE — Progress Notes (Signed)
?? [address]       [place] [address]             [phone]                                        Christopher Spencer Jaconita Medical Record #[id number] Date of Birth: 04/28/1965   Referring: Antonieta Iba, MD Primary Care: Patient, No Pcp Per Primary Cardiologist:Timothy Mariah Milling, MD   Chief Complaint:    Chief Complaint Patient presents with  Aortic Stenosis     New patient consultation ECHO 5/22, CATH 5/24, CTA chest 5/24      History of Present Illness:     57 year old male presents for surgical evaluation of severe aortic valve stenosis.  He originally presented with an NSTEMI but was noted to have nonobstructive coronary disease.  His echo was consistent elevated gradient.  This was his first episode of chest pain.  He has had progressive shortness of breath over the past several months.       Past Medical History: Diagnosis Date  Aortic stenosis     a. 11/2017 Echo: EF 60-65%. No rwma, Gr2 DD, mod AS (AVA 1.12 cm^2), mild AI/MR.  Cerebellar stroke (HCC)     a. 11/2017 in setting of drug abuse.  Hypertension    Polysubstance abuse (HCC)    Tobacco abuse         Past Surgical History: Procedure Laterality Date  CHOLECYSTECTOMY      RIGHT/LEFT HEART CATH AND CORONARY ANGIOGRAPHY N/A 06/12/2022   Procedure: RIGHT/LEFT HEART CATH AND CORONARY ANGIOGRAPHY;  Surgeon: Iran Ouch, MD;  Location: [facility] INVASIVE CV LAB;  Service: Cardiovascular;  Laterality: N/A;       Social History: Support: He presents today with his sister.   Tobacco Use History Social History    Tobacco Use Smoking Status Every Day  Packs/day: 0.25  Years: 10.00  Additional pack years: 0.00  Total pack years: 2.50  Types: Cigarettes Smokeless Tobacco Never    Social History   Substance and Sexual Activity Alcohol Use No       Allergies No Known Allergies        Current Outpatient Medications Medication Sig Dispense Refill  aspirin EC 81 MG  tablet Take 1 tablet (81 mg total) by mouth daily. 180 tablet 0  atorvastatin (LIPITOR) 80 MG tablet Take 1 tablet (80 mg total) by mouth daily. 30 tablet 2  carvedilol (COREG) 6.25 MG tablet Take 1 tablet (6.25 mg total) by mouth 2 (two) times daily with a meal. 60 tablet 2  losartan (COZAAR) 25 MG tablet Take 1 tablet (25 mg total) by mouth daily. 30 tablet 2     No current facility-administered medications for this visit.       (Not in a hospital admission)       Family History Problem Relation Age of Onset  Diabetes Father    Heart disease Sister           Review of Systems:    Review of Systems  Constitutional:  Positive for malaise/fatigue.  Respiratory:  Positive for shortness of breath.   Cardiovascular:  Positive for chest pain. Negative for palpitations, orthopnea and leg swelling.  Neurological: Negative.                    Physical Exam: BP 136/77 (BP Location:  Left Arm, Patient Position: Sitting, Cuff Size: Normal)   Pulse 68   Resp 20   Ht 5\' 9"  (1.753 m)   Wt 208 lb (94.3 kg)   SpO2 99% Comment: RA  BMI 30.72 kg/m  Physical Exam HENT:     Head: Normocephalic and atraumatic.  Eyes:     Extraocular Movements: Extraocular movements intact.  Cardiovascular:     Rate and Rhythm: Normal rate.  Pulmonary:     Effort: Pulmonary effort is normal. No respiratory distress.  Abdominal:     General: Abdomen is flat. There is no distension.  Musculoskeletal:        General: Normal range of motion.  Skin:    General: Skin is warm and dry.  Neurological:     General: No focal deficit present.     Mental Status: He is alert and oriented to person, place, and time.          Diagnostic Studies & Laboratory data:    Left Heart Catherization: Nonobstructive coronary disease. Echo:  1. Left ventricular ejection fraction, by estimation, is 60 to 65%. The  left ventricle has normal function. The left ventricle has no regional  wall motion  abnormalities. There is moderate left ventricular hypertrophy.  Left ventricular diastolic  parameters are consistent with Grade II diastolic dysfunction  (pseudonormalization).   2. Right ventricular systolic function is normal. The right ventricular  size is normal. There is moderately elevated pulmonary artery systolic  pressure. The estimated right ventricular systolic pressure is 54.8 mmHg.   3. Left atrial size was moderately dilated.   4. The mitral valve is normal in structure. Moderate mitral valve  regurgitation. No evidence of mitral stenosis.   5. Tricuspid valve regurgitation is moderate.   6. The aortic valve is normal in structure. There is severe calcifcation  of the aortic valve. Aortic valve regurgitation is mild. Severe aortic  valve stenosis. Aortic valve area, by VTI measures 0.95 cm. Aortic valve  mean gradient measures 41.6 mmHg.  Aortic valve Vmax measures 4.18 m/s.   7. The inferior vena cava is normal in size with greater than 50%  respiratory variability, suggesting right atrial pressure of 3 mmHg.    EKG: Sinus I have independently reviewed the above radiologic studies and discussed with the patient    Recent Lab Findings: Recent Labs Lab Results Component Value Date   WBC 7.3 06/10/2022   HGB 15.3 06/12/2022   HCT 45.0 06/12/2022   PLT 154 06/10/2022   GLUCOSE 150 (H) 06/11/2022   CHOL 163 06/10/2022   TRIG 95 06/10/2022   HDL 50 06/10/2022   LDLCALC 94 06/10/2022   ALT 15 02/17/2018   AST 14 (L) 02/17/2018   NA 139 06/12/2022   K 4.0 06/12/2022   CL 104 06/11/2022   CREATININE 0.88 06/11/2022   BUN 15 06/11/2022   CO2 26 06/11/2022   TSH 0.552 12/04/2017   INR 0.87 02/17/2018   HGBA1C 8.1 (H) 06/10/2022          Assessment / Plan:   57 year old male with severe aortic valve stenosis, and moderate mitral valve regurgitation.  On review of his echocardiogram the mitral valve regurgitation appears central.  He does not have any  significant coronary artery disease.  We discussed the risks and benefits of surgical aortic valve replacement.  He was confused initially because he thought that a bioprosthetic valve meant that he was going to get a TAVR.  This has been clarified.  He  would like to proceed with with a mechanical valve replacement.          I  spent 40 minutes counseling the patient face to face.     Sophea Rackham Keane Scrape

## 2022-08-12 NOTE — Progress Notes (Signed)
Surgical Instructions   Your procedure is scheduled on Mon., August 17, 2022 from 7:30AM-12:09PM.  Report to [facility] Main Entrance "A" at 5:30 A.M., then check in with the Admitting office.  Call this number if you have problems the morning of surgery:  [phone]  If you experience any cold or flu symptoms such as cough, fever, chills, shortness of breath, etc. between now and your scheduled surgery, please notify us at the above number.   Remember:  Do not eat or drink after midnight on July 28th.   Take these medicines the morning of surgery with A SIP OF WATER: Atorvastatin (LIPITOR)  Carvedilol (COREG)  As of today, STOP taking any Aspirin (unless otherwise instructed by your surgeon) Aleve, Naproxen, Ibuprofen, Motrin, Advil, Goody's, BC's, all herbal medications, fish oil, and all vitamins.             Day of Surgery: Do not wear jewelry. Do not wear lotions, powders, colognes, or deodorant. Do not shave arms, armpits, groin, and legs 48 hours prior to surgery.  Men may shave face and neck. Do not bring valuables to the hospital.             [facility] is not responsible for any belongings or valuables.  Do NOT Smoke (Tobacco/Vaping)  24 hours prior to your procedure  If you use a CPAP at night, you may bring your mask and machine for your overnight stay.   Contacts, glasses, hearing aids, dentures or partials may not be worn into surgery, please bring cases for these belongings   For patients admitted to the hospital, discharge time will be determined by your treatment team.   Patients discharged the day of surgery will not be allowed to drive home, and someone needs to stay with them for 24 hours.  Special instructions:    Oral Hygiene is also important to reduce your risk of infection.  Remember - BRUSH YOUR TEETH THE MORNING OF SURGERY WITH YOUR REGULAR TOOTHPASTE   [place]- Preparing For Surgery  Before surgery, you can play an important role. Because  skin is not sterile, your skin needs to be as free of germs as possible. You can reduce the number of germs on your skin by washing with CHG (chlorahexidine gluconate) Soap before surgery.  CHG is an antiseptic cleaner which kills germs and bonds with the skin to continue killing germs even after washing.    Please do not use if you have an allergy to CHG or antibacterial soaps. If your skin becomes reddened/irritated stop using the CHG.  Do not shave (including legs and underarms) for at least 48 hours prior to first CHG shower. It is OK to shave your face.  Please follow these instructions carefully.    Shower the NIGHT BEFORE SURGERY and the MORNING OF SURGERY with CHG Soap.   If you chose to wash your hair, wash your hair first as usual with your normal shampoo. After you shampoo, rinse your hair and body thoroughly to remove the shampoo.  Then [employer] and genitals (private parts) with your normal soap and rinse thoroughly to remove soap.  After that Use CHG Soap as you would any other liquid soap. You can apply CHG directly to the skin and wash gently with a scrungie or a clean washcloth.   Apply the CHG Soap to your body ONLY FROM THE NECK DOWN.  Do not use on open wounds or open sores. Avoid contact with your eyes, ears, mouth  and genitals (private parts). Wash Face and genitals (private parts)  with your normal soap.   Wash thoroughly, paying special attention to the area where your surgery will be performed.  Thoroughly rinse your body with warm water from the neck down.  DO NOT shower/wash with your normal soap after using and rinsing off the CHG Soap.  Pat yourself dry with a CLEAN TOWEL.  Wear CLEAN PAJAMAS to bed the night before surgery  Place CLEAN SHEETS on your bed the night before your surgery  DO NOT SLEEP WITH PETS.  Reminder: Take a shower with CHG soap. Wear Clean/Comfortable clothing the morning of surgery Do not apply any deodorants/lotions.   Remember to  brush your teeth WITH YOUR REGULAR TOOTHPASTE.   SURGICAL WAITING ROOM VISITATION Patients having surgery or a procedure may have no more than 2 support people in the waiting area - these visitors may rotate.   Children under the age of 21 must have an adult with them who is not the patient. If the patient needs to stay at the hospital during part of their recovery, the visitor guidelines for inpatient rooms apply. Pre-op nurse will coordinate an appropriate time for 1 support person to accompany patient in pre-op.  This support person may not rotate.    If you received a COVID test during your pre-op visit  it is requested that you wear a mask when out in public, stay away from anyone that may not be feeling well and notify your surgeon if you develop symptoms. If you have been in contact with anyone that has tested positive in the last 10 days please notify you surgeon.  Please read over the following fact sheets that you were given.

## 2022-08-12 NOTE — Progress Notes (Signed)
Surgical Instructions   Your procedure is scheduled on Monday August 17, 2022. Report to [facility] Main Entrance "A" at 5:30 A.M., then check in with the Admitting office. Any questions or running late day of surgery: call [phone]  Questions prior to your surgery date: call [phone], Monday-Friday, 8am-4pm. If you experience any cold or flu symptoms such as cough, fever, chills, shortness of breath, etc. between now and your scheduled surgery, please notify us at the above number.     Remember:  Do not eat or drink after midnight the night before your surgery    Take these medicines the morning of surgery with A SIP OF WATER  atorvastatin (LIPITOR)  carvedilol (COREG)    One week prior to surgery, STOP taking any Aspirin (unless otherwise instructed by your surgeon) Aleve, Naproxen, Ibuprofen, Motrin, Advil, Goody's, BC's, all herbal medications, fish oil, and non-prescription vitamins.                     Do NOT Smoke (Tobacco/Vaping) for 24 hours prior to your procedure.  If you use a CPAP at night, you may bring your mask/headgear for your overnight stay.   You will be asked to remove any contacts, glasses, piercing's, hearing aid's, dentures/partials prior to surgery. Please bring cases for these items if needed.    Patients discharged the day of surgery will not be allowed to drive home, and someone needs to stay with them for 24 hours.  SURGICAL WAITING ROOM VISITATION Patients may have no more than 2 support people in the waiting area - these visitors may rotate.   Pre-op nurse will coordinate an appropriate time for 1 ADULT support person, who may not rotate, to accompany patient in pre-op.  Children under the age of 58 must have an adult with them who is not the patient and must remain in the main waiting area with an adult.  If the patient needs to stay at the hospital during part of their recovery, the visitor guidelines for inpatient rooms apply.  Please  refer to the [facility] website for the visitor guidelines for any additional information.   If you received a COVID test during your pre-op visit  it is requested that you wear a mask when out in public, stay away from anyone that may not be feeling well and notify your surgeon if you develop symptoms. If you have been in contact with anyone that has tested positive in the last 10 days please notify you surgeon.      Pre-operative CHG Bathing Instructions   You can play a key role in reducing the risk of infection after surgery. Your skin needs to be as free of germs as possible. You can reduce the number of germs on your skin by washing with CHG (chlorhexidine gluconate) soap before surgery. CHG is an antiseptic soap that kills germs and continues to kill germs even after washing.   DO NOT use if you have an allergy to chlorhexidine/CHG or antibacterial soaps. If your skin becomes reddened or irritated, stop using the CHG and notify one of our RNs at [phone].              TAKE A SHOWER THE NIGHT BEFORE SURGERY AND THE DAY OF SURGERY    Please keep in mind the following:  DO NOT shave, including legs and underarms, 48 hours prior to surgery.   You may shave your face before/day of surgery.  Place clean sheets on your bed  the night before surgery Use a clean washcloth (not used since being washed) for each shower. DO NOT sleep with pet's night before surgery.  CHG Shower Instructions:  If you choose to wash your hair and private area, wash first with your normal shampoo/soap.  After you use shampoo/soap, rinse your hair and body thoroughly to remove shampoo/soap residue.  Turn the water OFF and apply half the bottle of CHG soap to a CLEAN washcloth.  Apply CHG soap ONLY FROM YOUR NECK DOWN TO YOUR TOES (washing for 3-5 minutes)  DO NOT use CHG soap on face, private areas, open wounds, or sores.  Pay special attention to the area where your surgery is being performed.  If you are  having back surgery, having someone wash your back for you may be helpful. Wait 2 minutes after CHG soap is applied, then you may rinse off the CHG soap.  Pat dry with a clean towel  Put on clean pajamas    Additional instructions for the day of surgery: DO NOT APPLY any lotions, deodorants or cologne  Do not wear jewelry Do not bring valuables to the hospital. [facility] is not responsible for valuables/personal belongings. Put on clean/comfortable clothes.  Please brush your teeth.  Ask your nurse before applying any prescription medications to the skin.

## 2022-08-13 ENCOUNTER — Ambulatory Visit (HOSPITAL_COMMUNITY)
Admission: RE | Admit: 2022-08-13 | Discharge: 2022-08-13 | Disposition: A | Discharge status: Home / Self Care | Payer: Medicaid Other | Source: Ambulatory Visit | Attending: Thoracic Surgery (Cardiothoracic Vascular Surgery) | Admitting: Thoracic Surgery (Cardiothoracic Vascular Surgery)

## 2022-08-13 ENCOUNTER — Encounter (HOSPITAL_COMMUNITY)
Admission: RE | Admit: 2022-08-13 | Discharge: 2022-08-13 | Disposition: A | Discharge status: Home / Self Care | Payer: Medicaid Other | Source: Ambulatory Visit | Attending: Thoracic Surgery (Cardiothoracic Vascular Surgery) | Admitting: Thoracic Surgery (Cardiothoracic Vascular Surgery)

## 2022-08-13 ENCOUNTER — Other Ambulatory Visit: Payer: Self-pay

## 2022-08-13 ENCOUNTER — Ambulatory Visit (HOSPITAL_COMMUNITY)
Admission: RE | Admit: 2022-08-13 | Discharge: 2022-08-13 | Disposition: A | Discharge status: Home / Self Care | Payer: Self-pay | Source: Ambulatory Visit | Attending: Thoracic Surgery (Cardiothoracic Vascular Surgery) | Admitting: Thoracic Surgery (Cardiothoracic Vascular Surgery)

## 2022-08-13 ENCOUNTER — Encounter (HOSPITAL_COMMUNITY): Payer: Self-pay

## 2022-08-13 VITALS — BP 138/73 | HR 73 | Temp 98.3°F | Resp 18 | Ht 69.0 in | Wt 210.6 lb

## 2022-08-13 DIAGNOSIS — I2510 Atherosclerotic heart disease of native coronary artery without angina pectoris: Secondary | ICD-10-CM | POA: Diagnosis not present

## 2022-08-13 DIAGNOSIS — Z1152 Encounter for screening for COVID-19: Secondary | ICD-10-CM | POA: Diagnosis not present

## 2022-08-13 DIAGNOSIS — I350 Nonrheumatic aortic (valve) stenosis: Secondary | ICD-10-CM | POA: Diagnosis not present

## 2022-08-13 DIAGNOSIS — Z01818 Encounter for other preprocedural examination: Secondary | ICD-10-CM

## 2022-08-13 DIAGNOSIS — I10 Essential (primary) hypertension: Secondary | ICD-10-CM | POA: Insufficient documentation

## 2022-08-13 DIAGNOSIS — Z8673 Personal history of transient ischemic attack (TIA), and cerebral infarction without residual deficits: Secondary | ICD-10-CM | POA: Insufficient documentation

## 2022-08-13 DIAGNOSIS — Z87891 Personal history of nicotine dependence: Secondary | ICD-10-CM | POA: Insufficient documentation

## 2022-08-13 DIAGNOSIS — I252 Old myocardial infarction: Secondary | ICD-10-CM | POA: Diagnosis not present

## 2022-08-13 HISTORY — DX: Other abnormal glucose: R73.09

## 2022-08-13 LAB — URINALYSIS, ROUTINE W REFLEX MICROSCOPIC
Bilirubin Urine: NEGATIVE
Glucose, UA: 150 mg/dL — AB
Hgb urine dipstick: NEGATIVE
Ketones, ur: NEGATIVE mg/dL
Leukocytes,Ua: NEGATIVE
Nitrite: NEGATIVE
Protein, ur: NEGATIVE mg/dL
Specific Gravity, Urine: 1.02 (ref 1.005–1.030)
pH: 6 (ref 5.0–8.0)

## 2022-08-13 LAB — COMPREHENSIVE METABOLIC PANEL
ALT: 46 U/L — ABNORMAL HIGH (ref 0–44)
AST: 25 U/L (ref 15–41)
Albumin: 4 g/dL (ref 3.5–5.0)
Alkaline Phosphatase: 79 U/L (ref 38–126)
Anion gap: 10 (ref 5–15)
BUN: 16 mg/dL (ref 6–20)
CO2: 27 mmol/L (ref 22–32)
Calcium: 9.3 mg/dL (ref 8.9–10.3)
Chloride: 99 mmol/L (ref 98–111)
Creatinine, Ser: 1.02 mg/dL (ref 0.61–1.24)
GFR, Estimated: >60 mL/min (ref >60)
Glucose, Bld: 223 mg/dL — ABNORMAL HIGH (ref 70–99)
Potassium: 4.1 mmol/L (ref 3.5–5.1)
Sodium: 136 mmol/L (ref 135–145)
Total Bilirubin: 0.7 mg/dL (ref 0.3–1.2)
Total Protein: 6.9 g/dL (ref 6.5–8.1)

## 2022-08-13 LAB — CBC
HCT: 45.2 % (ref 39.0–52.0)
Hemoglobin: 15 g/dL (ref 13.0–17.0)
MCH: 30.5 pg (ref 26.0–34.0)
MCHC: 33.2 g/dL (ref 30.0–36.0)
MCV: 92.1 fL (ref 80.0–100.0)
Platelets: 122 10*3/uL — ABNORMAL LOW (ref 150–400)
RBC: 4.91 MIL/uL (ref 4.22–5.81)
RDW: 12.7 % (ref 11.5–15.5)
WBC: 7.2 10*3/uL (ref 4.0–10.5)
nRBC: 0 % (ref 0.0–0.2)

## 2022-08-13 LAB — TYPE AND SCREEN
ABO/RH(D): O POS
Antibody Screen: NEGATIVE

## 2022-08-13 LAB — SURGICAL PCR SCREEN
MRSA, PCR: NEGATIVE
Staphylococcus aureus: NEGATIVE

## 2022-08-13 LAB — HEMOGLOBIN A1C
Hgb A1c MFr Bld: 8.7 % — ABNORMAL HIGH (ref 4.8–5.6)
Mean Plasma Glucose: 202.99 mg/dL

## 2022-08-13 LAB — APTT: aPTT: 28 seconds (ref 24–36)

## 2022-08-13 LAB — PROTIME-INR
INR: 0.9 (ref 0.8–1.2)
Prothrombin Time: 12.6 seconds (ref 11.4–15.2)

## 2022-08-13 LAB — SARS CORONAVIRUS 2 BY RT PCR: SARS Coronavirus 2 by RT PCR: NEGATIVE

## 2022-08-13 NOTE — Progress Notes (Signed)
PCP - Denies  Cardiologist - Denies  EP- Denies  Endocrine- Denies  Pulm- Denies  Chest x-ray - 08/13/22  EKG - 08/13/22  Stress Test - Denies  ECHO - 06/10/22 (E)  Cardiac Cath - 06/12/22 (E)  AICD-na PM-na LOOP-na  Nerve Stimulator- Denies  Dialysis- Denies  Sleep Study - Denies CPAP - Denies  LABS- 08/13/22: CBC, CMP, PT, PTT, ABG, UA, PCR, COVID  ASA-LD- 7/25  ERAS- No  HA1C- 08/13/22 GLP-1-na  Anesthesia- Yes- cardiac history  Pt denies having chest pain, sob, or fever at this time. All instructions explained to the pt, with a verbal understanding of the material. Pt agrees to go over the instructions while at home for a better understanding. The opportunity to ask questions was provided.

## 2022-08-14 ENCOUNTER — Encounter (HOSPITAL_COMMUNITY): Payer: Self-pay

## 2022-08-14 MED ORDER — NOREPINEPHRINE 4 MG/250ML-% IV SOLN
0.0000 ug/min | INTRAVENOUS | Status: DC
  Filled 2022-08-14: qty 250

## 2022-08-14 MED ORDER — MANNITOL 20 % IV SOLN
INTRAVENOUS | Status: DC
  Filled 2022-08-14: qty 13

## 2022-08-14 MED ORDER — PLASMA-LYTE A IV SOLN
INTRAVENOUS | Status: DC
  Filled 2022-08-14: qty 2.5

## 2022-08-14 MED ORDER — NITROGLYCERIN IN D5W 200-5 MCG/ML-% IV SOLN
2.0000 ug/min | INTRAVENOUS | Status: DC
  Filled 2022-08-14: qty 250

## 2022-08-14 MED ORDER — DEXMEDETOMIDINE HCL IN NACL 400 MCG/100ML IV SOLN
0.1000 ug/kg/h | INTRAVENOUS | Status: AC
  Administered 2022-08-17: .3 ug/kg/h via INTRAVENOUS
  Filled 2022-08-14: qty 100

## 2022-08-14 MED ORDER — POTASSIUM CHLORIDE 2 MEQ/ML IV SOLN
80.0000 meq | INTRAVENOUS | Status: DC
  Filled 2022-08-14: qty 40

## 2022-08-14 MED ORDER — CEFAZOLIN SODIUM-DEXTROSE 2-4 GM/100ML-% IV SOLN
2.0000 g | INTRAVENOUS | Status: AC
  Administered 2022-08-17: 2 g via INTRAVENOUS
  Filled 2022-08-14: qty 100

## 2022-08-14 MED ORDER — MILRINONE LACTATE IN DEXTROSE 20-5 MG/100ML-% IV SOLN
0.3000 ug/kg/min | INTRAVENOUS | Status: DC
  Filled 2022-08-14: qty 100

## 2022-08-14 MED ORDER — INSULIN REGULAR(HUMAN) IN NACL 100-0.9 UT/100ML-% IV SOLN
INTRAVENOUS | Status: AC
  Administered 2022-08-17: 8 [IU]/h via INTRAVENOUS
  Filled 2022-08-14: qty 100

## 2022-08-14 MED ORDER — TRANEXAMIC ACID (OHS) PUMP PRIME SOLUTION
2.0000 mg/kg | INTRAVENOUS | Status: DC
  Filled 2022-08-14: qty 1.91

## 2022-08-14 MED ORDER — EPINEPHRINE HCL 5 MG/250ML IV SOLN IN NS
0.0000 ug/min | INTRAVENOUS | Status: DC
  Filled 2022-08-14: qty 250

## 2022-08-14 MED ORDER — TRANEXAMIC ACID 1000 MG/10ML IV SOLN
1.5000 mg/kg/h | INTRAVENOUS | Status: AC
  Administered 2022-08-17: 1.5 mg/kg/h via INTRAVENOUS
  Filled 2022-08-14: qty 25

## 2022-08-14 MED ORDER — VANCOMYCIN HCL 1500 MG/300ML IV SOLN
1500.0000 mg | INTRAVENOUS | Status: AC
  Administered 2022-08-17: 1500 mg via INTRAVENOUS
  Filled 2022-08-14: qty 300

## 2022-08-14 MED ORDER — HEPARIN 30,000 UNITS/1000 ML (OHS) CELLSAVER SOLUTION
Status: DC
  Filled 2022-08-14: qty 1000

## 2022-08-14 MED ORDER — PHENYLEPHRINE HCL-NACL 20-0.9 MG/250ML-% IV SOLN
30.0000 ug/min | INTRAVENOUS | Status: AC
  Administered 2022-08-17: 20 ug/min via INTRAVENOUS
  Filled 2022-08-14: qty 250

## 2022-08-14 MED ORDER — TRANEXAMIC ACID (OHS) BOLUS VIA INFUSION
15.0000 mg/kg | INTRAVENOUS | Status: AC
  Administered 2022-08-17: 1432.5 mg via INTRAVENOUS
  Filled 2022-08-14: qty 1433

## 2022-08-14 NOTE — Progress Notes (Signed)
Anesthesia Chart Review:  Case: [id number] Date/Time: 08/17/22 0715   Procedures:      AORTIC VALVE REPLACEMENT (AVR) (Chest)     TRANSESOPHAGEAL ECHOCARDIOGRAM   Anesthesia type: General   Pre-op diagnosis: AS   Location: [facility] ROOM 14 / [facility] OR   Surgeons: [name], MD       DISCUSSION: Patient is a 57 year old male scheduled for the above procedure. Surgery was initially scheduled for 07/28/22, but case was delayed until he could follow-up with Dr. Cliffton Asters to clarify the procedure and type of valve.   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 Likely undiagnosed DM2 based on A1c results > 8%.   His A1c was 8.1% during 06/10/22 admission. He had not been previously diagnosed with DM2. He was not discharged on any diabetic medications. 08/13/22 A1c elevated now at 8.7%. Glucose 223. I sent communication to Dr. Cliffton Asters and [facility] RN Alycia Rossetti about A1c results with likely undiagnosed DM2. Will also enter order for Diabetes Coordinator to see while inpatient and can hopefully be addressed during his admission, as well as out-patient follow-up arranged.    Anesthesia team to evaluate on the day of surgery.   VS: BP 138/73   Pulse 73   Temp 36.8 C (Oral)   Resp 18   Ht 5\' 9"  (1.753 m)   Wt 95.5 kg   SpO2 100%   BMI 31.10 kg/m    PROVIDERS: Patient, No Pcp Per Julien Nordmann, MD is cardiologist   LABS: Preoperative labs noted.  (all labs ordered are listed, but only abnormal results are displayed)  Labs Reviewed  CBC - Abnormal; Notable for the following components:      Result Value   Platelets 122 (*)    All other components within normal limits  COMPREHENSIVE METABOLIC PANEL -  Abnormal; Notable for the following components:   Glucose, Bld 223 (*)    ALT 46 (*)    All other components within normal limits  URINALYSIS, ROUTINE W REFLEX MICROSCOPIC - Abnormal; Notable for the following components:   Glucose, UA 150 (*)    All other components within normal limits  HEMOGLOBIN A1C - Abnormal; Notable for the following components:   Hgb A1c MFr Bld 8.7 (*)    All other components within normal limits  SARS CORONAVIRUS 2 BY RT PCR  SURGICAL PCR SCREEN  PROTIME-INR  APTT  TYPE AND SCREEN    IMAGES: CXR 08/13/22: FINDINGS: The heart size and mediastinal contours are within normal limits. Both lungs are clear. The visualized skeletal structures are unremarkable. IMPRESSION: No active cardiopulmonary disease.  CTA Chest 06/12/22: IMPRESSION: 1. No evidence of thoracic aortic aneurysm or dissection. Normal contour and caliber. Mild atherosclerosis. 2. Dense aortic valve calcifications. 3. Coronary artery disease. 4. Minimal emphysema and diffuse bilateral bronchial wall thickening.   EKG: 08/13/22: Sinus rhythm with Premature atrial complexes Left ventricular hypertrophy with repolarization abnormality ( R in aVL , Cornell product ) Abnormal ECG When compared with ECG of 10-Jun-2022 23:09, Since last tracing Premature atrial complexes NOW SEEN Confirmed by Weston Brass ([id number]) on 08/13/2022 1:03:55 PM   CV: US Carotid 08/13/22: Summary:  Right Carotid: Velocities in the right ICA are consistent with a 1-39%  stenosis.  Left Carotid: Velocities in  the left ICA are consistent with a 1-39%  stenosis.               Non-hemodynamically significant plaque <50% noted in the  CCA.  Vertebrals: Bilateral vertebral arteries demonstrate antegrade flow.  Subclavians: Normal flow hemodynamics were seen in bilateral subclavian               arteries.    RHC/LHC 06/12/22:   The left ventricular systolic function is normal.   LV end diastolic pressure is  normal.   The left ventricular ejection fraction is 55-65% by visual estimate.   1.  Minimal irregularities with no evidence of obstructive coronary artery disease. 2.  Normal LV systolic function. 3.  Severe aortic stenosis with mean gradient of 56 mmHg and valve area of 0.77. 4.  Right heart catheterization showed mildly elevated filling pressures, mild pulmonary hypertension and normal cardiac output.   RA: 9 mmHg RV: 38/5 mmHg PW: 16 mmHg with normal waveforms PA: 36/17 with a mean of 22 mmHg Cardiac output is 5.67 with a cardiac index of 2.74.   Recommendations: Suspect that the patient has bicuspid aortic valve given valve morphology on echo and his relatively young age.  Recommend evaluation for aortic valve replacement.    Echo 06/10/22: IMPRESSIONS   1. Left ventricular ejection fraction, by estimation, is 60 to 65%. The  left ventricle has normal function. The left ventricle has no regional  wall motion abnormalities. There is moderate left ventricular hypertrophy.  Left ventricular diastolic  parameters are consistent with Grade II diastolic dysfunction  (pseudonormalization).   2. Right ventricular systolic function is normal. The right ventricular  size is normal. There is moderately elevated pulmonary artery systolic  pressure. The estimated right ventricular systolic pressure is 54.8 mmHg.   3. Left atrial size was moderately dilated.   4. The mitral valve is normal in structure. Moderate mitral valve  regurgitation. No evidence of mitral stenosis.   5. Tricuspid valve regurgitation is moderate.   6. The aortic valve is normal in structure. There is severe calcifcation  of the aortic valve. Aortic valve regurgitation is mild. Severe aortic  valve stenosis. Aortic valve area, by VTI measures 0.95 cm. Aortic valve  mean gradient measures 41.6 mmHg.  Aortic valve Vmax measures 4.18 m/s.   7. The inferior vena cava is normal in size with greater than 50%  respiratory  variability, suggesting right atrial pressure of 3 mmHg.   Past Medical History:  Diagnosis Date   Aortic stenosis    a. 11/2017 Echo: EF 60-65%. No rwma, Gr2 DD, mod AS (AVA 1.12 cm^2), mild AI/MR.   Arthritis    Cerebellar stroke (HCC)    a. 11/2017 in setting of drug abuse.   Elevated hemoglobin A1c    8.7% 08/13/22   Heart murmur    History of kidney stones    Hypertension    Polysubstance abuse (HCC)    Tobacco abuse     Past Surgical History:  Procedure Laterality Date   CHOLECYSTECTOMY     RIGHT/LEFT HEART CATH AND CORONARY ANGIOGRAPHY N/A 06/12/2022   Procedure: RIGHT/LEFT HEART CATH AND CORONARY ANGIOGRAPHY;  Surgeon: Iran Ouch, MD;  Location: [facility] INVASIVE CV LAB;  Service: Cardiovascular;  Laterality: N/A;    MEDICATIONS:  aspirin EC 81 MG tablet   atorvastatin (LIPITOR) 80 MG tablet   carvedilol (COREG) 6.25 MG tablet   losartan (COZAAR) 25 MG tablet   No current facility-administered medications for this encounter.  Shonna Chock, PA-C Surgical Short Stay/Anesthesiology [facility] Phone [phone] [facility] Phone [phone] 08/14/2022 11:10 AM

## 2022-08-17 ENCOUNTER — Inpatient Hospital Stay (HOSPITAL_COMMUNITY): Payer: Medicaid Other | Admitting: Certified Registered Nurse Anesthetist

## 2022-08-17 ENCOUNTER — Encounter (HOSPITAL_COMMUNITY)
Admission: RE | Disposition: A | Discharge status: Home / Self Care | Payer: Self-pay | Source: Home / Self Care | Attending: Thoracic Surgery (Cardiothoracic Vascular Surgery)

## 2022-08-17 ENCOUNTER — Inpatient Hospital Stay (HOSPITAL_COMMUNITY): Payer: Medicaid Other

## 2022-08-17 ENCOUNTER — Other Ambulatory Visit: Payer: Self-pay

## 2022-08-17 ENCOUNTER — Inpatient Hospital Stay (HOSPITAL_COMMUNITY)
Admission: RE | Admit: 2022-08-17 | Discharge: 2022-08-21 | DRG: 219 | Disposition: A | Discharge status: Home / Self Care | Payer: Medicaid Other | Attending: Thoracic Surgery (Cardiothoracic Vascular Surgery) | Admitting: Thoracic Surgery (Cardiothoracic Vascular Surgery)

## 2022-08-17 ENCOUNTER — Encounter (HOSPITAL_COMMUNITY): Payer: Self-pay | Admitting: Thoracic Surgery (Cardiothoracic Vascular Surgery)

## 2022-08-17 DIAGNOSIS — Z6832 Body mass index (BMI) 32.0-32.9, adult: Secondary | ICD-10-CM

## 2022-08-17 DIAGNOSIS — I10 Essential (primary) hypertension: Secondary | ICD-10-CM | POA: Diagnosis not present

## 2022-08-17 DIAGNOSIS — Z7982 Long term (current) use of aspirin: Secondary | ICD-10-CM

## 2022-08-17 DIAGNOSIS — Z8249 Family history of ischemic heart disease and other diseases of the circulatory system: Secondary | ICD-10-CM | POA: Diagnosis not present

## 2022-08-17 DIAGNOSIS — Z952 Presence of prosthetic heart valve: Secondary | ICD-10-CM | POA: Diagnosis not present

## 2022-08-17 DIAGNOSIS — Z87442 Personal history of urinary calculi: Secondary | ICD-10-CM | POA: Diagnosis not present

## 2022-08-17 DIAGNOSIS — Z833 Family history of diabetes mellitus: Secondary | ICD-10-CM | POA: Diagnosis not present

## 2022-08-17 DIAGNOSIS — D62 Acute posthemorrhagic anemia: Secondary | ICD-10-CM | POA: Diagnosis not present

## 2022-08-17 DIAGNOSIS — N990 Postprocedural (acute) (chronic) kidney failure: Secondary | ICD-10-CM | POA: Diagnosis not present

## 2022-08-17 DIAGNOSIS — Z8673 Personal history of transient ischemic attack (TIA), and cerebral infarction without residual deficits: Secondary | ICD-10-CM | POA: Diagnosis not present

## 2022-08-17 DIAGNOSIS — I4891 Unspecified atrial fibrillation: Secondary | ICD-10-CM | POA: Diagnosis not present

## 2022-08-17 DIAGNOSIS — I350 Nonrheumatic aortic (valve) stenosis: Secondary | ICD-10-CM | POA: Diagnosis not present

## 2022-08-17 DIAGNOSIS — F17210 Nicotine dependence, cigarettes, uncomplicated: Secondary | ICD-10-CM | POA: Diagnosis not present

## 2022-08-17 DIAGNOSIS — D696 Thrombocytopenia, unspecified: Secondary | ICD-10-CM | POA: Diagnosis not present

## 2022-08-17 DIAGNOSIS — I214 Non-ST elevation (NSTEMI) myocardial infarction: Secondary | ICD-10-CM | POA: Diagnosis not present

## 2022-08-17 DIAGNOSIS — Z5900 Homelessness unspecified: Secondary | ICD-10-CM

## 2022-08-17 DIAGNOSIS — E871 Hypo-osmolality and hyponatremia: Secondary | ICD-10-CM | POA: Diagnosis present

## 2022-08-17 DIAGNOSIS — I2510 Atherosclerotic heart disease of native coronary artery without angina pectoris: Secondary | ICD-10-CM | POA: Diagnosis present

## 2022-08-17 DIAGNOSIS — I440 Atrioventricular block, first degree: Secondary | ICD-10-CM | POA: Diagnosis present

## 2022-08-17 DIAGNOSIS — E669 Obesity, unspecified: Secondary | ICD-10-CM | POA: Diagnosis present

## 2022-08-17 DIAGNOSIS — K5900 Constipation, unspecified: Secondary | ICD-10-CM | POA: Diagnosis present

## 2022-08-17 DIAGNOSIS — Z79899 Other long term (current) drug therapy: Secondary | ICD-10-CM

## 2022-08-17 DIAGNOSIS — I279 Pulmonary heart disease, unspecified: Secondary | ICD-10-CM | POA: Diagnosis not present

## 2022-08-17 DIAGNOSIS — E1165 Type 2 diabetes mellitus with hyperglycemia: Secondary | ICD-10-CM | POA: Diagnosis not present

## 2022-08-17 DIAGNOSIS — I110 Hypertensive heart disease with heart failure: Secondary | ICD-10-CM | POA: Diagnosis not present

## 2022-08-17 DIAGNOSIS — E8770 Fluid overload, unspecified: Secondary | ICD-10-CM | POA: Diagnosis not present

## 2022-08-17 HISTORY — PX: TEE WITHOUT CARDIOVERSION: SHX5443

## 2022-08-17 HISTORY — PX: AORTIC VALVE REPLACEMENT: SHX41

## 2022-08-17 LAB — GLUCOSE, CAPILLARY
Glucose-Capillary: 175 mg/dL — ABNORMAL HIGH (ref 70–99)
Glucose-Capillary: 105 mg/dL — ABNORMAL HIGH (ref 70–99)
Glucose-Capillary: 91 mg/dL (ref 70–99)
Glucose-Capillary: 136 mg/dL — ABNORMAL HIGH (ref 70–99)
Glucose-Capillary: 115 mg/dL — ABNORMAL HIGH (ref 70–99)
Glucose-Capillary: 104 mg/dL — ABNORMAL HIGH (ref 70–99)
Glucose-Capillary: 125 mg/dL — ABNORMAL HIGH (ref 70–99)
Glucose-Capillary: 130 mg/dL — ABNORMAL HIGH (ref 70–99)
Glucose-Capillary: 112 mg/dL — ABNORMAL HIGH (ref 70–99)
Glucose-Capillary: 104 mg/dL — ABNORMAL HIGH (ref 70–99)

## 2022-08-17 LAB — CBC
HCT: 31.7 % — ABNORMAL LOW (ref 39.0–52.0)
HCT: 36.8 % — ABNORMAL LOW (ref 39.0–52.0)
Hemoglobin: 10.6 g/dL — ABNORMAL LOW (ref 13.0–17.0)
Hemoglobin: 12.2 g/dL — ABNORMAL LOW (ref 13.0–17.0)
MCH: 31.4 pg (ref 26.0–34.0)
MCH: 31.1 pg (ref 26.0–34.0)
MCHC: 33.4 g/dL (ref 30.0–36.0)
MCHC: 33.2 g/dL (ref 30.0–36.0)
MCV: 93.8 fL (ref 80.0–100.0)
MCV: 93.9 fL (ref 80.0–100.0)
Platelets: 90 10*3/uL — ABNORMAL LOW (ref 150–400)
Platelets: 120 10*3/uL — ABNORMAL LOW (ref 150–400)
RBC: 3.38 MIL/uL — ABNORMAL LOW (ref 4.22–5.81)
RBC: 3.92 MIL/uL — ABNORMAL LOW (ref 4.22–5.81)
RDW: 12.8 % (ref 11.5–15.5)
RDW: 12.8 % (ref 11.5–15.5)
WBC: 11.2 10*3/uL — ABNORMAL HIGH (ref 4.0–10.5)
WBC: 11.3 10*3/uL — ABNORMAL HIGH (ref 4.0–10.5)
nRBC: 0 % (ref 0.0–0.2)
nRBC: 0 % (ref 0.0–0.2)

## 2022-08-17 LAB — BASIC METABOLIC PANEL
Anion gap: 7 (ref 5–15)
BUN: 15 mg/dL (ref 6–20)
CO2: 24 mmol/L (ref 22–32)
Calcium: 8.6 mg/dL — ABNORMAL LOW (ref 8.9–10.3)
Chloride: 106 mmol/L (ref 98–111)
Creatinine, Ser: 1.03 mg/dL (ref 0.61–1.24)
GFR, Estimated: >60 mL/min (ref >60)
Glucose, Bld: 126 mg/dL — ABNORMAL HIGH (ref 70–99)
Potassium: 4.6 mmol/L (ref 3.5–5.1)
Sodium: 137 mmol/L (ref 135–145)

## 2022-08-17 LAB — POCT I-STAT 7, (LYTES, BLD GAS, ICA,H+H)
Acid-Base Excess: 0 mmol/L (ref 0.0–2.0)
Acid-Base Excess: 0 mmol/L (ref 0.0–2.0)
Acid-Base Excess: 0 mmol/L (ref 0.0–2.0)
Acid-base deficit: 1 mmol/L (ref 0.0–2.0)
Acid-base deficit: 2 mmol/L (ref 0.0–2.0)
Acid-base deficit: 1 mmol/L (ref 0.0–2.0)
Acid-base deficit: 3 mmol/L — ABNORMAL HIGH (ref 0.0–2.0)
Bicarbonate: 26.2 mmol/L (ref 20.0–28.0)
Bicarbonate: 24.6 mmol/L (ref 20.0–28.0)
Bicarbonate: 24.8 mmol/L (ref 20.0–28.0)
Bicarbonate: 24.1 mmol/L (ref 20.0–28.0)
Bicarbonate: 23.4 mmol/L (ref 20.0–28.0)
Bicarbonate: 24.7 mmol/L (ref 20.0–28.0)
Bicarbonate: 22.4 mmol/L (ref 20.0–28.0)
Calcium, Ion: 1.24 mmol/L (ref 1.15–1.40)
Calcium, Ion: 0.98 mmol/L — ABNORMAL LOW (ref 1.15–1.40)
Calcium, Ion: 1.07 mmol/L — ABNORMAL LOW (ref 1.15–1.40)
Calcium, Ion: 1.35 mmol/L (ref 1.15–1.40)
Calcium, Ion: 1.32 mmol/L (ref 1.15–1.40)
Calcium, Ion: 1.23 mmol/L (ref 1.15–1.40)
Calcium, Ion: 1.24 mmol/L (ref 1.15–1.40)
HCT: 40 % (ref 39.0–52.0)
HCT: 28 % — ABNORMAL LOW (ref 39.0–52.0)
HCT: 29 % — ABNORMAL LOW (ref 39.0–52.0)
HCT: 29 % — ABNORMAL LOW (ref 39.0–52.0)
HCT: 32 % — ABNORMAL LOW (ref 39.0–52.0)
HCT: 36 % — ABNORMAL LOW (ref 39.0–52.0)
HCT: 36 % — ABNORMAL LOW (ref 39.0–52.0)
Hemoglobin: 13.6 g/dL (ref 13.0–17.0)
Hemoglobin: 9.5 g/dL — ABNORMAL LOW (ref 13.0–17.0)
Hemoglobin: 9.9 g/dL — ABNORMAL LOW (ref 13.0–17.0)
Hemoglobin: 9.9 g/dL — ABNORMAL LOW (ref 13.0–17.0)
Hemoglobin: 10.9 g/dL — ABNORMAL LOW (ref 13.0–17.0)
Hemoglobin: 12.2 g/dL — ABNORMAL LOW (ref 13.0–17.0)
Hemoglobin: 12.2 g/dL — ABNORMAL LOW (ref 13.0–17.0)
O2 Saturation: 100 %
O2 Saturation: 100 %
O2 Saturation: 100 %
O2 Saturation: 98 %
O2 Saturation: 96 %
O2 Saturation: 94 %
O2 Saturation: 88 %
Patient temperature: 37.1
Patient temperature: 37.3
Patient temperature: 37
Potassium: 4.1 mmol/L (ref 3.5–5.1)
Potassium: 3.8 mmol/L (ref 3.5–5.1)
Potassium: 3.9 mmol/L (ref 3.5–5.1)
Potassium: 3.7 mmol/L (ref 3.5–5.1)
Potassium: 3.8 mmol/L (ref 3.5–5.1)
Potassium: 5.2 mmol/L — ABNORMAL HIGH (ref 3.5–5.1)
Potassium: 4.5 mmol/L (ref 3.5–5.1)
Sodium: 140 mmol/L (ref 135–145)
Sodium: 140 mmol/L (ref 135–145)
Sodium: 141 mmol/L (ref 135–145)
Sodium: 141 mmol/L (ref 135–145)
Sodium: 141 mmol/L (ref 135–145)
Sodium: 140 mmol/L (ref 135–145)
Sodium: 139 mmol/L (ref 135–145)
TCO2: 28 mmol/L (ref 22–32)
TCO2: 26 mmol/L (ref 22–32)
TCO2: 26 mmol/L (ref 22–32)
TCO2: 25 mmol/L (ref 22–32)
TCO2: 25 mmol/L (ref 22–32)
TCO2: 26 mmol/L (ref 22–32)
TCO2: 24 mmol/L (ref 22–32)
pCO2 arterial: 50.6 mmHg — ABNORMAL HIGH (ref 32–48)
pCO2 arterial: 38.7 mmHg (ref 32–48)
pCO2 arterial: 40.7 mmHg (ref 32–48)
pCO2 arterial: 37.5 mmHg (ref 32–48)
pCO2 arterial: 40.3 mmHg (ref 32–48)
pCO2 arterial: 44.5 mmHg (ref 32–48)
pCO2 arterial: 41.9 mmHg (ref 32–48)
pH, Arterial: 7.322 — ABNORMAL LOW (ref 7.35–7.45)
pH, Arterial: 7.411 (ref 7.35–7.45)
pH, Arterial: 7.394 (ref 7.35–7.45)
pH, Arterial: 7.416 (ref 7.35–7.45)
pH, Arterial: 7.373 (ref 7.35–7.45)
pH, Arterial: 7.354 (ref 7.35–7.45)
pH, Arterial: 7.336 — ABNORMAL LOW (ref 7.35–7.45)
pO2, Arterial: 417 mmHg — ABNORMAL HIGH (ref 83–108)
pO2, Arterial: 382 mmHg — ABNORMAL HIGH (ref 83–108)
pO2, Arterial: 371 mmHg — ABNORMAL HIGH (ref 83–108)
pO2, Arterial: 109 mmHg — ABNORMAL HIGH (ref 83–108)
pO2, Arterial: 81 mmHg — ABNORMAL LOW (ref 83–108)
pO2, Arterial: 75 mmHg — ABNORMAL LOW (ref 83–108)
pO2, Arterial: 59 mmHg — ABNORMAL LOW (ref 83–108)

## 2022-08-17 LAB — POCT I-STAT EG7
Acid-Base Excess: 1 mmol/L (ref 0.0–2.0)
Bicarbonate: 26.4 mmol/L (ref 20.0–28.0)
Calcium, Ion: 1.08 mmol/L — ABNORMAL LOW (ref 1.15–1.40)
HCT: 30 % — ABNORMAL LOW (ref 39.0–52.0)
Hemoglobin: 10.2 g/dL — ABNORMAL LOW (ref 13.0–17.0)
O2 Saturation: 80 %
Potassium: 3.4 mmol/L — ABNORMAL LOW (ref 3.5–5.1)
Sodium: 141 mmol/L (ref 135–145)
TCO2: 28 mmol/L (ref 22–32)
pCO2, Ven: 46.1 mmHg (ref 44–60)
pH, Ven: 7.367 (ref 7.25–7.43)
pO2, Ven: 46 mmHg — ABNORMAL HIGH (ref 32–45)

## 2022-08-17 LAB — PROTIME-INR
INR: 1.3 — ABNORMAL HIGH (ref 0.8–1.2)
Prothrombin Time: 16.5 seconds — ABNORMAL HIGH (ref 11.4–15.2)

## 2022-08-17 LAB — POCT I-STAT, CHEM 8
BUN: 16 mg/dL (ref 6–20)
BUN: 16 mg/dL (ref 6–20)
BUN: 15 mg/dL (ref 6–20)
BUN: 15 mg/dL (ref 6–20)
BUN: 14 mg/dL (ref 6–20)
Calcium, Ion: 1.25 mmol/L (ref 1.15–1.40)
Calcium, Ion: 1.15 mmol/L (ref 1.15–1.40)
Calcium, Ion: 1.07 mmol/L — ABNORMAL LOW (ref 1.15–1.40)
Calcium, Ion: 1.08 mmol/L — ABNORMAL LOW (ref 1.15–1.40)
Calcium, Ion: 1.34 mmol/L (ref 1.15–1.40)
Chloride: 104 mmol/L (ref 98–111)
Chloride: 103 mmol/L (ref 98–111)
Chloride: 104 mmol/L (ref 98–111)
Chloride: 104 mmol/L (ref 98–111)
Chloride: 105 mmol/L (ref 98–111)
Creatinine, Ser: 0.8 mg/dL (ref 0.61–1.24)
Creatinine, Ser: 0.9 mg/dL (ref 0.61–1.24)
Creatinine, Ser: 0.8 mg/dL (ref 0.61–1.24)
Creatinine, Ser: 0.9 mg/dL (ref 0.61–1.24)
Creatinine, Ser: 0.9 mg/dL (ref 0.61–1.24)
Glucose, Bld: 194 mg/dL — ABNORMAL HIGH (ref 70–99)
Glucose, Bld: 195 mg/dL — ABNORMAL HIGH (ref 70–99)
Glucose, Bld: 145 mg/dL — ABNORMAL HIGH (ref 70–99)
Glucose, Bld: 140 mg/dL — ABNORMAL HIGH (ref 70–99)
Glucose, Bld: 121 mg/dL — ABNORMAL HIGH (ref 70–99)
HCT: 39 % (ref 39.0–52.0)
HCT: 37 % — ABNORMAL LOW (ref 39.0–52.0)
HCT: 31 % — ABNORMAL LOW (ref 39.0–52.0)
HCT: 27 % — ABNORMAL LOW (ref 39.0–52.0)
HCT: 28 % — ABNORMAL LOW (ref 39.0–52.0)
Hemoglobin: 13.3 g/dL (ref 13.0–17.0)
Hemoglobin: 12.6 g/dL — ABNORMAL LOW (ref 13.0–17.0)
Hemoglobin: 10.5 g/dL — ABNORMAL LOW (ref 13.0–17.0)
Hemoglobin: 9.2 g/dL — ABNORMAL LOW (ref 13.0–17.0)
Hemoglobin: 9.5 g/dL — ABNORMAL LOW (ref 13.0–17.0)
Potassium: 4.1 mmol/L (ref 3.5–5.1)
Potassium: 3.8 mmol/L (ref 3.5–5.1)
Potassium: 4.1 mmol/L (ref 3.5–5.1)
Potassium: 4 mmol/L (ref 3.5–5.1)
Potassium: 3.7 mmol/L (ref 3.5–5.1)
Sodium: 141 mmol/L (ref 135–145)
Sodium: 139 mmol/L (ref 135–145)
Sodium: 140 mmol/L (ref 135–145)
Sodium: 141 mmol/L (ref 135–145)
Sodium: 141 mmol/L (ref 135–145)
TCO2: 28 mmol/L (ref 22–32)
TCO2: 25 mmol/L (ref 22–32)
TCO2: 26 mmol/L (ref 22–32)
TCO2: 28 mmol/L (ref 22–32)
TCO2: 23 mmol/L (ref 22–32)

## 2022-08-17 LAB — HEMOGLOBIN AND HEMATOCRIT, BLOOD
HCT: 29.1 % — ABNORMAL LOW (ref 39.0–52.0)
Hemoglobin: 9.7 g/dL — ABNORMAL LOW (ref 13.0–17.0)

## 2022-08-17 LAB — ECHO INTRAOPERATIVE TEE
Height: 69 in
Weight: 3360 oz

## 2022-08-17 LAB — APTT: aPTT: 29 seconds (ref 24–36)

## 2022-08-17 LAB — PLATELET COUNT: Platelets: 98 10*3/uL — ABNORMAL LOW (ref 150–400)

## 2022-08-17 SURGERY — REPLACEMENT, AORTIC VALVE, OPEN
Anesthesia: General | Site: Chest

## 2022-08-17 MED ORDER — SODIUM CHLORIDE 0.9 % IV SOLN: 250.0000 mL | INTRAVENOUS | Status: DC

## 2022-08-17 MED ORDER — PANTOPRAZOLE SODIUM 40 MG PO TBEC
40.0000 mg | DELAYED_RELEASE_TABLET | Freq: Every day | ORAL | Status: DC
  Administered 2022-08-19 – 2022-08-21 (×3): 40 mg via ORAL
  Filled 2022-08-17 (×4): qty 1

## 2022-08-17 MED ORDER — BISACODYL 5 MG PO TBEC
10.0000 mg | DELAYED_RELEASE_TABLET | Freq: Every day | ORAL | Status: DC
  Administered 2022-08-18 – 2022-08-20 (×3): 10 mg via ORAL
  Filled 2022-08-17 (×4): qty 2

## 2022-08-17 MED ORDER — LACTATED RINGERS IV SOLN: INTRAVENOUS | Status: DC

## 2022-08-17 MED ORDER — MIDAZOLAM HCL 2 MG/2ML IJ SOLN: 2.0000 mg | INTRAMUSCULAR | Status: DC | PRN

## 2022-08-17 MED ORDER — FENTANYL CITRATE (PF) 250 MCG/5ML IJ SOLN
INTRAMUSCULAR | Status: AC
  Filled 2022-08-17: qty 5

## 2022-08-17 MED ORDER — NICARDIPINE HCL IN NACL 20-0.86 MG/200ML-% IV SOLN
0.0000 mg/h | INTRAVENOUS | Status: DC
  Administered 2022-08-17: 15 mg/h via INTRAVENOUS
  Administered 2022-08-17: 5 mg/h via INTRAVENOUS
  Administered 2022-08-18: 12 mg/h via INTRAVENOUS
  Filled 2022-08-17 (×2): qty 200
  Filled 2022-08-17: qty 400

## 2022-08-17 MED ORDER — PROPOFOL 10 MG/ML IV BOLUS
INTRAVENOUS | Status: AC
  Filled 2022-08-17: qty 20

## 2022-08-17 MED ORDER — LIDOCAINE 2% (20 MG/ML) 5 ML SYRINGE
INTRAMUSCULAR | Status: DC | PRN
  Administered 2022-08-17: 20 mg via INTRAVENOUS

## 2022-08-17 MED ORDER — CHLORHEXIDINE GLUCONATE 0.12 % MT SOLN
15.0000 mL | Freq: Once | OROMUCOSAL | Status: AC
  Administered 2022-08-17: 15 mL via OROMUCOSAL
  Filled 2022-08-17: qty 15

## 2022-08-17 MED ORDER — SODIUM CHLORIDE 0.9 % IV SOLN: INTRAVENOUS | Status: DC | PRN

## 2022-08-17 MED ORDER — DOCUSATE SODIUM 100 MG PO CAPS
200.0000 mg | ORAL_CAPSULE | Freq: Every day | ORAL | Status: DC
  Administered 2022-08-18 – 2022-08-20 (×3): 200 mg via ORAL
  Filled 2022-08-17 (×4): qty 2

## 2022-08-17 MED ORDER — CEFAZOLIN SODIUM-DEXTROSE 2-4 GM/100ML-% IV SOLN
2.0000 g | Freq: Three times a day (TID) | INTRAVENOUS | Status: AC
  Administered 2022-08-17 – 2022-08-19 (×5): 2 g via INTRAVENOUS
  Filled 2022-08-17 (×5): qty 100

## 2022-08-17 MED ORDER — SODIUM CHLORIDE 0.9 % IV SOLN: INTRAVENOUS | Status: DC

## 2022-08-17 MED ORDER — LACTATED RINGERS IV SOLN: INTRAVENOUS | Status: DC | PRN

## 2022-08-17 MED ORDER — LIDOCAINE 2% (20 MG/ML) 5 ML SYRINGE
INTRAMUSCULAR | Status: AC
  Filled 2022-08-17: qty 5

## 2022-08-17 MED ORDER — HEMOSTATIC AGENTS (NO CHARGE) OPTIME
TOPICAL | Status: DC | PRN
Start: 2022-08-17 — End: 2022-08-17
  Administered 2022-08-17 (×2): 1 via TOPICAL

## 2022-08-17 MED ORDER — ACETAMINOPHEN 325 MG PO TABS
650.0000 mg | ORAL_TABLET | Freq: Once | ORAL | Status: AC
  Administered 2022-08-17: 650 mg via ORAL
  Filled 2022-08-17: qty 2

## 2022-08-17 MED ORDER — DEXTROSE 50 % IV SOLN: 0.0000 mL | INTRAVENOUS | Status: DC | PRN

## 2022-08-17 MED ORDER — MORPHINE SULFATE (PF) 2 MG/ML IV SOLN
1.0000 mg | INTRAVENOUS | Status: DC | PRN
  Administered 2022-08-17 (×3): 2 mg via INTRAVENOUS
  Administered 2022-08-17: 1 mg via INTRAVENOUS
  Administered 2022-08-18: 4 mg via INTRAVENOUS
  Administered 2022-08-18 (×2): 2 mg via INTRAVENOUS
  Administered 2022-08-18: 4 mg via INTRAVENOUS
  Administered 2022-08-18 – 2022-08-19 (×6): 2 mg via INTRAVENOUS
  Filled 2022-08-17 (×2): qty 1
  Filled 2022-08-17: qty 2
  Filled 2022-08-17 (×5): qty 1
  Filled 2022-08-17: qty 2
  Filled 2022-08-17 (×4): qty 1

## 2022-08-17 MED ORDER — EPHEDRINE SULFATE-NACL 50-0.9 MG/10ML-% IV SOSY: PREFILLED_SYRINGE | INTRAVENOUS | Status: DC | PRN

## 2022-08-17 MED ORDER — ALBUMIN HUMAN 5 % IV SOLN
250.0000 mL | INTRAVENOUS | Status: DC | PRN
  Administered 2022-08-17 (×2): 12.5 g via INTRAVENOUS

## 2022-08-17 MED ORDER — ATORVASTATIN CALCIUM 80 MG PO TABS
80.0000 mg | ORAL_TABLET | Freq: Every day | ORAL | Status: DC
  Administered 2022-08-17 – 2022-08-21 (×5): 80 mg via ORAL
  Filled 2022-08-17 (×4): qty 1

## 2022-08-17 MED ORDER — PLASMA-LYTE A IV SOLN
INTRAVENOUS | Status: DC | PRN
  Administered 2022-08-17: 500 mL

## 2022-08-17 MED ORDER — CHLORHEXIDINE GLUCONATE 0.12 % MT SOLN
15.0000 mL | OROMUCOSAL | Status: AC
  Administered 2022-08-17: 15 mL via OROMUCOSAL
  Filled 2022-08-17: qty 15

## 2022-08-17 MED ORDER — CHLORHEXIDINE GLUCONATE CLOTH 2 % EX PADS
6.0000 | MEDICATED_PAD | Freq: Every day | CUTANEOUS | Status: DC
  Administered 2022-08-17 – 2022-08-18 (×2): 6 via TOPICAL

## 2022-08-17 MED ORDER — MIDAZOLAM HCL (PF) 5 MG/ML IJ SOLN
INTRAMUSCULAR | Status: DC | PRN
  Administered 2022-08-17: 1 mg via INTRAVENOUS
  Administered 2022-08-17 (×4): 2 mg via INTRAVENOUS
  Administered 2022-08-17: 1 mg via INTRAVENOUS
  Administered 2022-08-17: 2 mg via INTRAVENOUS

## 2022-08-17 MED ORDER — SUCCINYLCHOLINE CHLORIDE 200 MG/10ML IV SOSY
PREFILLED_SYRINGE | INTRAVENOUS | Status: AC
  Filled 2022-08-17: qty 10

## 2022-08-17 MED ORDER — ~~LOC~~ CARDIAC SURGERY, PATIENT & FAMILY EDUCATION
Freq: Once | Status: DC
  Filled 2022-08-17: qty 1

## 2022-08-17 MED ORDER — CHLORHEXIDINE GLUCONATE 0.12 % MT SOLN: 15.0000 mL | Freq: Once | OROMUCOSAL | Status: DC

## 2022-08-17 MED ORDER — MIDAZOLAM HCL (PF) 10 MG/2ML IJ SOLN
INTRAMUSCULAR | Status: AC
  Filled 2022-08-17: qty 2

## 2022-08-17 MED ORDER — INSULIN REGULAR(HUMAN) IN NACL 100-0.9 UT/100ML-% IV SOLN
INTRAVENOUS | Status: DC
  Administered 2022-08-18: 2.6 [IU]/h via INTRAVENOUS
  Filled 2022-08-17: qty 100

## 2022-08-17 MED ORDER — CHLORHEXIDINE GLUCONATE 4 % EX SOLN: 30.0000 mL | CUTANEOUS | Status: DC

## 2022-08-17 MED ORDER — PHENYLEPHRINE 80 MCG/ML (10ML) SYRINGE FOR IV PUSH (FOR BLOOD PRESSURE SUPPORT)
PREFILLED_SYRINGE | INTRAVENOUS | Status: AC
  Filled 2022-08-17: qty 10

## 2022-08-17 MED ORDER — ROCURONIUM BROMIDE 10 MG/ML (PF) SYRINGE
PREFILLED_SYRINGE | INTRAVENOUS | Status: AC
  Filled 2022-08-17: qty 10

## 2022-08-17 MED ORDER — ACETAMINOPHEN 160 MG/5ML PO SOLN: 650.0000 mg | Freq: Once | ORAL | Status: DC

## 2022-08-17 MED ORDER — PANTOPRAZOLE SODIUM 40 MG IV SOLR
40.0000 mg | Freq: Every day | INTRAVENOUS | Status: AC
  Administered 2022-08-17 – 2022-08-18 (×2): 40 mg via INTRAVENOUS
  Filled 2022-08-17 (×2): qty 10

## 2022-08-17 MED ORDER — ACETAMINOPHEN 500 MG PO TABS
1000.0000 mg | ORAL_TABLET | Freq: Four times a day (QID) | ORAL | Status: DC
  Administered 2022-08-17 – 2022-08-21 (×15): 1000 mg via ORAL
  Filled 2022-08-17 (×15): qty 2

## 2022-08-17 MED ORDER — ASPIRIN 81 MG PO CHEW
324.0000 mg | CHEWABLE_TABLET | Freq: Once | ORAL | Status: AC
  Administered 2022-08-17: 324 mg via ORAL
  Filled 2022-08-17: qty 4

## 2022-08-17 MED ORDER — ONDANSETRON HCL 4 MG/2ML IJ SOLN
4.0000 mg | Freq: Four times a day (QID) | INTRAMUSCULAR | Status: DC | PRN
  Administered 2022-08-17: 4 mg via INTRAVENOUS
  Filled 2022-08-17: qty 2

## 2022-08-17 MED ORDER — EPHEDRINE 5 MG/ML INJ
INTRAVENOUS | Status: AC
  Filled 2022-08-17: qty 5

## 2022-08-17 MED ORDER — OXYCODONE HCL 5 MG PO TABS
5.0000 mg | ORAL_TABLET | ORAL | Status: DC | PRN
  Administered 2022-08-17: 5 mg via ORAL
  Administered 2022-08-17 – 2022-08-19 (×11): 10 mg via ORAL
  Filled 2022-08-17 (×7): qty 2
  Filled 2022-08-17: qty 1
  Filled 2022-08-17 (×4): qty 2

## 2022-08-17 MED ORDER — FENTANYL CITRATE (PF) 250 MCG/5ML IJ SOLN
INTRAMUSCULAR | Status: DC | PRN
  Administered 2022-08-17 (×3): 50 ug via INTRAVENOUS
  Administered 2022-08-17 (×2): 100 ug via INTRAVENOUS
  Administered 2022-08-17 (×2): 50 ug via INTRAVENOUS
  Administered 2022-08-17: 100 ug via INTRAVENOUS
  Administered 2022-08-17 (×4): 50 ug via INTRAVENOUS
  Administered 2022-08-17: 100 ug via INTRAVENOUS
  Administered 2022-08-17 (×2): 50 ug via INTRAVENOUS
  Administered 2022-08-17: 100 ug via INTRAVENOUS

## 2022-08-17 MED ORDER — METOPROLOL TARTRATE 12.5 MG HALF TABLET
12.5000 mg | ORAL_TABLET | Freq: Once | ORAL | Status: AC
  Administered 2022-08-17: 12.5 mg via ORAL
  Filled 2022-08-17: qty 1

## 2022-08-17 MED ORDER — ASPIRIN 325 MG PO TBEC: 325.0000 mg | DELAYED_RELEASE_TABLET | Freq: Every day | ORAL | Status: DC

## 2022-08-17 MED ORDER — ASPIRIN 81 MG PO CHEW: 324.0000 mg | CHEWABLE_TABLET | Freq: Every day | ORAL | Status: DC

## 2022-08-17 MED ORDER — ROCURONIUM BROMIDE 10 MG/ML (PF) SYRINGE
PREFILLED_SYRINGE | INTRAVENOUS | Status: DC | PRN
  Administered 2022-08-17: 50 mg via INTRAVENOUS
  Administered 2022-08-17: 30 mg via INTRAVENOUS
  Administered 2022-08-17: 70 mg via INTRAVENOUS
  Administered 2022-08-17: 50 mg via INTRAVENOUS

## 2022-08-17 MED ORDER — CALCIUM CHLORIDE 10 % IV SOLN
INTRAVENOUS | Status: DC | PRN
  Administered 2022-08-17: 200 mg via INTRAVENOUS

## 2022-08-17 MED ORDER — PROTAMINE SULFATE 10 MG/ML IV SOLN
INTRAVENOUS | Status: DC | PRN
  Administered 2022-08-17: 20 mg via INTRAVENOUS
  Administered 2022-08-17: 330 mg via INTRAVENOUS

## 2022-08-17 MED ORDER — HEPARIN SODIUM (PORCINE) 1000 UNIT/ML IJ SOLN
INTRAMUSCULAR | Status: DC | PRN
  Administered 2022-08-17: 38000 [IU] via INTRAVENOUS

## 2022-08-17 MED ORDER — TRAMADOL HCL 50 MG PO TABS
50.0000 mg | ORAL_TABLET | ORAL | Status: DC | PRN
  Administered 2022-08-17: 100 mg via ORAL
  Administered 2022-08-18: 50 mg via ORAL
  Administered 2022-08-18 – 2022-08-19 (×2): 100 mg via ORAL
  Filled 2022-08-17: qty 2
  Filled 2022-08-17: qty 1
  Filled 2022-08-17 (×2): qty 2

## 2022-08-17 MED ORDER — SODIUM CHLORIDE 0.9% FLUSH
3.0000 mL | Freq: Two times a day (BID) | INTRAVENOUS | Status: DC
  Administered 2022-08-18 – 2022-08-20 (×4): 3 mL via INTRAVENOUS

## 2022-08-17 MED ORDER — MIDAZOLAM HCL 2 MG/2ML IJ SOLN
INTRAMUSCULAR | Status: AC
  Filled 2022-08-17: qty 2

## 2022-08-17 MED ORDER — VANCOMYCIN HCL IN DEXTROSE 1-5 GM/200ML-% IV SOLN
1000.0000 mg | Freq: Once | INTRAVENOUS | Status: AC
  Administered 2022-08-17: 1000 mg via INTRAVENOUS
  Filled 2022-08-17: qty 200

## 2022-08-17 MED ORDER — DOBUTAMINE-DEXTROSE 4-5 MG/ML-% IV SOLN: 0.0000 ug/kg/min | INTRAVENOUS | Status: DC

## 2022-08-17 MED ORDER — METOPROLOL TARTRATE 5 MG/5ML IV SOLN
2.5000 mg | INTRAVENOUS | Status: DC | PRN
  Administered 2022-08-18 – 2022-08-19 (×5): 2.5 mg via INTRAVENOUS
  Filled 2022-08-17 (×3): qty 5

## 2022-08-17 MED ORDER — DEXMEDETOMIDINE HCL IN NACL 400 MCG/100ML IV SOLN: 0.0000 ug/kg/h | INTRAVENOUS | Status: DC

## 2022-08-17 MED ORDER — ACETAMINOPHEN 160 MG/5ML PO SOLN: 1000.0000 mg | Freq: Four times a day (QID) | ORAL | Status: DC

## 2022-08-17 MED ORDER — BISACODYL 10 MG RE SUPP: 10.0000 mg | Freq: Every day | RECTAL | Status: DC

## 2022-08-17 MED ORDER — POTASSIUM CHLORIDE 10 MEQ/50ML IV SOLN
10.0000 meq | INTRAVENOUS | Status: AC
  Administered 2022-08-17 (×3): 10 meq via INTRAVENOUS

## 2022-08-17 MED ORDER — METOPROLOL TARTRATE 12.5 MG HALF TABLET
12.5000 mg | ORAL_TABLET | Freq: Two times a day (BID) | ORAL | Status: DC
  Administered 2022-08-17 – 2022-08-18 (×3): 12.5 mg via ORAL
  Filled 2022-08-17 (×3): qty 1

## 2022-08-17 MED ORDER — ONDANSETRON HCL 4 MG/2ML IJ SOLN
INTRAMUSCULAR | Status: DC | PRN
  Administered 2022-08-17: 4 mg via INTRAVENOUS

## 2022-08-17 MED ORDER — 0.9 % SODIUM CHLORIDE (POUR BTL) OPTIME
TOPICAL | Status: DC | PRN
  Administered 2022-08-17: 5000 mL

## 2022-08-17 MED ORDER — SODIUM CHLORIDE 0.45 % IV SOLN
INTRAVENOUS | Status: DC | PRN
  Administered 2022-08-17: 20 mL/h via INTRAVENOUS

## 2022-08-17 MED ORDER — DEXAMETHASONE SODIUM PHOSPHATE 10 MG/ML IJ SOLN
INTRAMUSCULAR | Status: DC | PRN
  Administered 2022-08-17: 10 mg via INTRAVENOUS

## 2022-08-17 MED ORDER — METOPROLOL TARTRATE 25 MG/10 ML ORAL SUSPENSION: 12.5000 mg | Freq: Two times a day (BID) | ORAL | Status: DC

## 2022-08-17 MED ORDER — SODIUM CHLORIDE 0.9% FLUSH: 3.0000 mL | INTRAVENOUS | Status: DC | PRN

## 2022-08-17 MED ORDER — ORAL CARE MOUTH RINSE: 15.0000 mL | Freq: Once | OROMUCOSAL | Status: AC

## 2022-08-17 MED ORDER — PHENYLEPHRINE 80 MCG/ML (10ML) SYRINGE FOR IV PUSH (FOR BLOOD PRESSURE SUPPORT)
PREFILLED_SYRINGE | INTRAVENOUS | Status: DC | PRN
  Administered 2022-08-17 (×3): 40 ug via INTRAVENOUS

## 2022-08-17 MED ORDER — METOCLOPRAMIDE HCL 5 MG/ML IJ SOLN
10.0000 mg | Freq: Four times a day (QID) | INTRAMUSCULAR | Status: AC
  Administered 2022-08-17 – 2022-08-18 (×6): 10 mg via INTRAVENOUS
  Filled 2022-08-17 (×6): qty 2

## 2022-08-17 MED ORDER — MAGNESIUM SULFATE 4 GM/100ML IV SOLN
4.0000 g | Freq: Once | INTRAVENOUS | Status: AC
  Administered 2022-08-17: 4 g via INTRAVENOUS
  Filled 2022-08-17: qty 100

## 2022-08-17 MED ORDER — ORAL CARE MOUTH RINSE: 15.0000 mL | OROMUCOSAL | Status: DC | PRN

## 2022-08-17 MED ORDER — PROPOFOL 10 MG/ML IV BOLUS
INTRAVENOUS | Status: DC | PRN
Start: 2022-08-17 — End: 2022-08-17
  Administered 2022-08-17: 70 mg via INTRAVENOUS
  Administered 2022-08-17: 20 mg via INTRAVENOUS

## 2022-08-17 SURGICAL SUPPLY — 77 items
ADAPTER CARDIO PERF ANTE/RETRO (ADAPTER) ×2 IMPLANT
ADAPTER MULTI PERFUSION 15 (ADAPTER) IMPLANT
ADPR PRFSN 84XANTGRD RTRGD (ADAPTER) ×2
BAG DECANTER FOR FLEXI CONT (MISCELLANEOUS) ×2 IMPLANT
BLADE CLIPPER SURG (BLADE) ×2 IMPLANT
BLADE STERNUM SYSTEM 6 (BLADE) ×2 IMPLANT
BLADE SURG 15 STRL LF DISP TIS (BLADE) ×2 IMPLANT
BLADE SURG 15 STRL SS (BLADE) ×2
CANISTER SUCT 3000ML PPV (MISCELLANEOUS) ×2 IMPLANT
CANNULA GUNDRY RCSP 15FR (MISCELLANEOUS) ×2 IMPLANT
CANNULA MC2 2 STG 29/37 NON-V (CANNULA) IMPLANT
CANNULA NON VENT 20FR 12 (CANNULA) ×2 IMPLANT
CATH HEART VENT LEFT (CATHETERS) ×2 IMPLANT
CATH ROBINSON RED A/P 18FR (CATHETERS) ×6 IMPLANT
CNTNR URN SCR LID CUP LEK RST (MISCELLANEOUS) ×2 IMPLANT
CONNECTOR BLAKE 2:1 CARIO BLK (MISCELLANEOUS) IMPLANT
CONT SPEC 4OZ STRL OR WHT (MISCELLANEOUS) ×2
CONTAINER PROTECT SURGISLUSH (MISCELLANEOUS) ×4 IMPLANT
COVER SURGICAL LIGHT HANDLE (MISCELLANEOUS) ×2 IMPLANT
DEVICE SUT CK QUICK LOAD MINI (Prosthesis & Implant Heart) IMPLANT
DRAIN CHANNEL 19F RND (DRAIN) ×2 IMPLANT
DRAPE CARDIOVASCULAR INCISE (DRAPES) ×2
DRAPE INCISE IOBAN 66X45 STRL (DRAPES) IMPLANT
DRAPE SRG 135X102X78XABS (DRAPES) ×2 IMPLANT
DRAPE WARM FLUID 44X44 (DRAPES) ×2 IMPLANT
DRSG AQUACEL AG ADV 3.5X10 (GAUZE/BANDAGES/DRESSINGS) IMPLANT
ELECT CAUTERY BLADE 6.4 (BLADE) ×2 IMPLANT
ELECT REM PT RETURN 9FT ADLT (ELECTROSURGICAL) ×4
ELECTRODE REM PT RTRN 9FT ADLT (ELECTROSURGICAL) ×4 IMPLANT
FELT TEFLON 1X6 (MISCELLANEOUS) ×4 IMPLANT
GAUZE SPONGE 4X4 12PLY STRL (GAUZE/BANDAGES/DRESSINGS) ×2 IMPLANT
GLOVE BIO SURGEON STRL SZ 6 (GLOVE) IMPLANT
GLOVE BIO SURGEON STRL SZ 6.5 (GLOVE) IMPLANT
GLOVE BIO SURGEON STRL SZ7 (GLOVE) ×6 IMPLANT
GLOVE BIO SURGEON STRL SZ7.5 (GLOVE) IMPLANT
GLOVE BIOGEL PI IND STRL 6 (GLOVE) IMPLANT
GLOVE BIOGEL PI IND STRL 6.5 (GLOVE) IMPLANT
GLOVE BIOGEL PI IND STRL 7.0 (GLOVE) IMPLANT
GOWN STRL REUS W/ TWL LRG LVL3 (GOWN DISPOSABLE) ×8 IMPLANT
GOWN STRL REUS W/ TWL XL LVL3 (GOWN DISPOSABLE) ×4 IMPLANT
GOWN STRL REUS W/TWL LRG LVL3 (GOWN DISPOSABLE) ×8
GOWN STRL REUS W/TWL XL LVL3 (GOWN DISPOSABLE) ×4
HEMOSTAT POWDER SURGIFOAM 1G (HEMOSTASIS) ×4 IMPLANT
HEMOSTAT SURGICEL 2X14 (HEMOSTASIS) ×2 IMPLANT
INSERT FOGARTY XLG (MISCELLANEOUS) IMPLANT
INSERT SUTURE HOLDER (MISCELLANEOUS) ×2 IMPLANT
KIT BASIN OR (CUSTOM PROCEDURE TRAY) ×2 IMPLANT
KIT SUCTION CATH 14FR (SUCTIONS) ×2 IMPLANT
KIT SUT CK MINI COMBO 4X17 (Prosthesis & Implant Heart) IMPLANT
KIT TURNOVER KIT B (KITS) ×2 IMPLANT
LEAD PACING MYOCARDI (MISCELLANEOUS) IMPLANT
LINE VENT (MISCELLANEOUS) IMPLANT
NS IRRIG 1000ML POUR BTL (IV SOLUTION) ×12 IMPLANT
ORGANIZER SUTURE GABBAY-FRATER (MISCELLANEOUS) ×2 IMPLANT
PACK E OPEN HEART (SUTURE) ×2 IMPLANT
PACK OPEN HEART (CUSTOM PROCEDURE TRAY) ×2 IMPLANT
PAD ARMBOARD 7.5X6 YLW CONV (MISCELLANEOUS) ×4 IMPLANT
POSITIONER HEAD DONUT 9IN (MISCELLANEOUS) ×2 IMPLANT
SET MPS 3-ND DEL (MISCELLANEOUS) IMPLANT
SUT BONE WAX W31G (SUTURE) ×2 IMPLANT
SUT EB EXC GRN/WHT 2-0 V-5 (SUTURE) ×4 IMPLANT
SUT PROLENE 3 0 SH DA (SUTURE) IMPLANT
SUT PROLENE 3 0 SH1 36 (SUTURE) ×2 IMPLANT
SUT PROLENE 4 0 RB 1 (SUTURE) ×6
SUT PROLENE 4 0 SH DA (SUTURE) IMPLANT
SUT PROLENE 4-0 RB1 .5 CRCL 36 (SUTURE) ×6 IMPLANT
SUT STEEL 6MS V (SUTURE) IMPLANT
SYSTEM SAHARA CHEST DRAIN ATS (WOUND CARE) ×2 IMPLANT
TAPE CLOTH SURG 4X10 WHT LF (GAUZE/BANDAGES/DRESSINGS) IMPLANT
TAPE PAPER 2X10 WHT MICROPORE (GAUZE/BANDAGES/DRESSINGS) IMPLANT
TOWEL GREEN STERILE (TOWEL DISPOSABLE) ×2 IMPLANT
TOWEL GREEN STERILE FF (TOWEL DISPOSABLE) ×2 IMPLANT
TRAY FOLEY SLVR 16FR TEMP STAT (SET/KITS/TRAYS/PACK) ×2 IMPLANT
UNDERPAD 30X36 HEAVY ABSORB (UNDERPADS AND DIAPERS) ×2 IMPLANT
VALVE ON-X AORTIC 25 (Prosthesis & Implant Heart) IMPLANT
VENT LEFT HEART 12002 (CATHETERS) ×2
WATER STERILE IRR 1000ML POUR (IV SOLUTION) ×4 IMPLANT

## 2022-08-17 NOTE — Transfer of Care (Signed)
Immediate Anesthesia Transfer of Care Note  Patient: Christopher Spencer  Procedure(s) Performed: AORTIC VALVE REPLACEMENT USING A 25 MM ON-X PROSTHETIC HEART VALVE (Chest) TRANSESOPHAGEAL ECHOCARDIOGRAM  Patient Location: ICU  Anesthesia Type:General  Level of Consciousness: sedated and Patient remains intubated per anesthesia plan  Airway & Oxygen Therapy: Patient remains intubated per anesthesia plan and Patient placed on Ventilator (see vital sign flow sheet for setting)  Post-op Assessment: Report given to RN and Post -op Vital signs reviewed and stable  Post vital signs: Reviewed and stable  Last Vitals:  Vitals Value Taken Time  BP 107/66   Temp    Pulse 60 08/17/22 1240  Resp 18 08/17/22 1240  SpO2 99 % 08/17/22 1240  Vitals shown include unfiled device data.  Last Pain:  Vitals:   08/17/22 0612  TempSrc:   PainSc: 0-No pain         Complications: No notable events documented.

## 2022-08-17 NOTE — Progress Notes (Signed)
      [address]       [place] [address]             [phone]      S/p AVR  Intubated, wean in progress  BP 108/69   Pulse 68   Temp 99.1 F (37.3 C)   Resp 18   Ht 5\' 9"  (1.753 m)   Wt 95.3 kg   SpO2 96%   BMI 31.01 kg/m   CI 2.3, CVP 16  On nicardipine drip   Intake/Output Summary (Last 24 hours) at 08/17/2022 1745 Last data filed at 08/17/2022 1700 Gross per 24 hour  Intake 3491.33 ml  Output 2440 ml  Net 1051.33 ml   Doing well early postop  Viviann Spare C. Dorris Fetch, MD [facility] [phone]

## 2022-08-17 NOTE — Progress Notes (Signed)
NAME:  Christopher Spencer, MRN:  [id number], DOB:  1966/01/11, LOS: 0 ADMISSION DATE:  08/17/2022, CONSULTATION DATE: 08/17/2022 REFERRING MD: Cliffton Asters, CHIEF COMPLAINT: Postoperative ventilator management  History of Present Illness:  57 year old man who underwent uneventful mechanical AVR for aortic stenosis.  He originally presented as an NSTEMI was found to have nonobstructive coronary disease but marked aortic gradient due to aortic stenosis.  He had had symptoms of progressive shortness of breath.  Pertinent  Medical History   Past Medical History:  Diagnosis Date   Aortic stenosis    a. 11/2017 Echo: EF 60-65%. No rwma, Gr2 DD, mod AS (AVA 1.12 cm^2), mild AI/MR.   Arthritis    Cerebellar stroke (HCC)    a. 11/2017 in setting of drug abuse.   Elevated hemoglobin A1c    8.7% 08/13/22   Heart murmur    History of kidney stones    Hypertension    Polysubstance abuse (HCC)    Tobacco abuse    Significant Hospital Events: Including procedures, antibiotic start and stop dates in addition to other pertinent events   7/29-25 mm On-X valve aortic valve replacement.  Interim History / Subjective:  Uneventful operation.  Good hemostasis.  Came off on minimal support  Objective   Blood pressure (!) 179/104, pulse 60, temperature 98.6 F (37 C), resp. rate 16, height 5\' 9"  (1.753 m), weight 95.3 kg, SpO2 95%. CVP:  [12 mmHg-13 mmHg] 13 mmHg CO:  [3.3 L/min-5 L/min] 3.3 L/min CI:  [1.5 L/min/m2-2.4 L/min/m2] 1.5 L/min/m2  Vent Mode: SIMV;PRVC;PSV FiO2 (%):  [50 %] 50 % Set Rate:  [16 bmp] 16 bmp Vt Set:  [560 mL] 560 mL PEEP:  [5 cmH20] 5 cmH20 Pressure Support:  [10 cmH20] 10 cmH20 Plateau Pressure:  [21 cmH20] 21 cmH20   Intake/Output Summary (Last 24 hours) at 08/17/2022 1311 Last data filed at 08/17/2022 1211 Gross per 24 hour  Intake 2526 ml  Output 1208 ml  Net 1318 ml   Filed Weights   08/17/22 0548  Weight: 95.3 kg    Examination: General: Well-built man HENT:  Orally intubated.  OG tube in place. Lungs: Chest clear to auscultation bilaterally. Cardiovascular: Heart sounds normal.  Extremities warm well-perfused.  No edema.  VVI pacer at 60.  Underlying rhythm is sinus bradycardia in upper 50s.  Blood pressure dropped with V pacing Abdomen: Soft nontender. Extremities: Warm and well-perfused Neuro: Sedated GU: Foley catheter in place with good urine output.  Ancillary tests personally reviewed  Postoperative labs pending  Assessment & Plan:  Critically ill following AVR requiring titration of IV fluids, vasopressors and postoperative mechanical ventilation. Calcific aortic stenosis now status post On-X mechanical valve  Plan:  -Fluid resuscitation per protocol to achieve normal stroke-volume -Titrate phenylephrine to keep MAP greater than 65 -Backup pacemaker at 60.  Blood pressure does not tolerate the pacing well. -Appropriate for fast-track extubation -Monitor chest tube output.  Correct coagulopathy if necessary. -Initiate multimodal pain control.  Best Practice (right click and "Reselect all SmartList Selections" daily)   Diet/type: NPO DVT prophylaxis: SCD GI prophylaxis: H2B Lines: Central line and Arterial Line Foley:  Yes, and it is still needed Code Status:  full code Last date of multidisciplinary goals of care discussion [Per primary team]   CRITICAL CARE Performed by: Lynnell Catalan   Total critical care time: 35 minutes  Critical care time was exclusive of separately billable procedures and treating other patients.  Critical care was necessary to treat or prevent imminent  or life-threatening deterioration.  Critical care was time spent personally by me on the following activities: development of treatment plan with patient and/or surrogate as well as nursing, discussions with consultants, evaluation of patient's response to treatment, examination of patient, obtaining history from patient or surrogate, ordering and  performing treatments and interventions, ordering and review of laboratory studies, ordering and review of radiographic studies, pulse oximetry, re-evaluation of patient's condition and participation in multidisciplinary rounds.  Lynnell Catalan, MD [facility] ICU Physician [facility]  Pager: [phone] Mobile: [phone] After hours: [phone].

## 2022-08-17 NOTE — Anesthesia Procedure Notes (Signed)
Central Venous Catheter Insertion Performed by: Shelton Silvas, MD, anesthesiologist Start/End7/29/2024 7:10 AM, 08/17/2022 7:20 AM Patient location: Pre-op. Preanesthetic checklist: patient identified, IV checked, site marked, risks and benefits discussed, surgical consent, monitors and equipment checked, pre-op evaluation, timeout performed and anesthesia consent Position: Trendelenburg Lidocaine 1% used for infiltration and patient sedated Hand hygiene performed , maximum sterile barriers used  and Seldinger technique used Catheter size: 8.5 Fr Total catheter length 10. Central line was placed.Sheath introducer Swan type:thermodilution PA Cath depth:50 Procedure performed using ultrasound guided technique. Ultrasound Notes:anatomy identified, needle tip was noted to be adjacent to the nerve/plexus identified, no ultrasound evidence of intravascular and/or intraneural injection and image(s) printed for medical record Attempts: 1 Following insertion, line sutured, dressing applied and Biopatch. Post procedure assessment: blood return through all ports, free fluid flow and no air  Patient tolerated the procedure well with no immediate complications.

## 2022-08-17 NOTE — Progress Notes (Signed)
No diabetic protocol at this time per Dr. Hart Rochester.

## 2022-08-17 NOTE — Interval H&P Note (Signed)
History and Physical Interval Note:  08/17/2022 7:44 AM  Christopher Spencer  has presented today for surgery, with the diagnosis of AS.  The various methods of treatment have been discussed with the patient and family. After consideration of risks, benefits and other options for treatment, the patient has consented to  Procedure(s): AORTIC VALVE REPLACEMENT (AVR) (N/A) TRANSESOPHAGEAL ECHOCARDIOGRAM (N/A) as a surgical intervention.  The patient's history has been reviewed, patient examined, no change in status, stable for surgery.  I have reviewed the patient's chart and labs.  Questions were answered to the patient's satisfaction.     Tarri Guilfoil Keane Scrape

## 2022-08-17 NOTE — Procedures (Signed)
Extubation Procedure Note  Patient Details:   Name: Christopher Spencer DOB: February 18, 1965 MRN: [id number]   Airway Documentation:  Airway 8 mm (Active)  Secured at (cm) 25 cm 08/17/22 1518  Measured From Lips 08/17/22 1518  Secured Location Right 08/17/22 1518  Secured By Pink Tape 08/17/22 1518  Prone position No 08/17/22 1239  Cuff Pressure (cm H2O) Clear OR 27-39 CmH2O 08/17/22 1239  Site Condition Dry 08/17/22 1518   Vent end date: (not recorded) Vent end time: (not recorded)   Evaluation  O2 sats: currently acceptable Complications: No apparent complications Patient did tolerate procedure well. Bilateral Breath Sounds: Rhonchi   Yes Pt extubated to 2L [place] w/o complication NIF -22 FVC .9L Positive Cuff Leak  Cleta Heatley V 08/17/2022, 6:06 PM

## 2022-08-17 NOTE — Brief Op Note (Signed)
08/17/2022  9:34 AM  PATIENT:  Christopher Spencer  57 y.o. male  PRE-OPERATIVE DIAGNOSIS:  AORTIC STENOSIS  POST-OPERATIVE DIAGNOSIS:  AORTIC STENOSIS  PROCEDURE:  Procedure(s):  AORTIC VALVE REPLACEMENT  - 25 MM ON-X PROSTHETIC HEART VALVE (N/A)  TRANSESOPHAGEAL ECHOCARDIOGRAM (N/A)  SURGEON:  Surgeons and Role:    * [name], MD - Primary  PHYSICIAN ASSISTANT: Lowella Dandy PA-C  ASSISTANTS: Tanda Rockers RNFA, Virgilio Frees RNFA   ANESTHESIA:   general  EBL:  348 mL   BLOOD ADMINISTERED:   CC PRBC  DRAINS:  Mediastinal Chest Drains    LOCAL MEDICATIONS USED:  NONE  SPECIMEN:  Source of Specimen:  Aortic Valve Leaflets  DISPOSITION OF SPECIMEN:  PATHOLOGY  COUNTS:  YES  TOURNIQUET:  * No tourniquets in log *  DICTATION: .Dragon Dictation  PLAN OF CARE: Admit to inpatient   PATIENT DISPOSITION:  ICU - intubated and hemodynamically stable.   Delay start of Pharmacological VTE agent (>24hrs) due to surgical blood loss or risk of bleeding: yes

## 2022-08-17 NOTE — Anesthesia Preprocedure Evaluation (Addendum)
Anesthesia Evaluation  Patient identified by MRN, date of birth, ID band Patient awake    Reviewed: Allergy & Precautions, NPO status , Patient's Chart, lab work & pertinent test results  Airway Mallampati: III  TM Distance: >3 FB Neck ROM: Full    Dental  (+) Teeth Intact, Dental Advisory Given   Pulmonary Current Smoker and Patient abstained from smoking.   breath sounds clear to auscultation       Cardiovascular hypertension, Pt. on home beta blockers and Pt. on medications + Past MI  + Valvular Problems/Murmurs AS  Rhythm:Regular Rate:Normal + Systolic murmurs Echo:  1. Left ventricular ejection fraction, by estimation, is 60 to 65%. The  left ventricle has normal function. The left ventricle has no regional  wall motion abnormalities. There is moderate left ventricular hypertrophy.  Left ventricular diastolic  parameters are consistent with Grade II diastolic dysfunction  (pseudonormalization).   2. Right ventricular systolic function is normal. The right ventricular  size is normal. There is moderately elevated pulmonary artery systolic  pressure. The estimated right ventricular systolic pressure is 54.8 mmHg.   3. Left atrial size was moderately dilated.   4. The mitral valve is normal in structure. Moderate mitral valve  regurgitation. No evidence of mitral stenosis.   5. Tricuspid valve regurgitation is moderate.   6. The aortic valve is normal in structure. There is severe calcifcation  of the aortic valve. Aortic valve regurgitation is mild. Severe aortic  valve stenosis. Aortic valve area, by VTI measures 0.95 cm. Aortic valve  mean gradient measures 41.6 mmHg.  Aortic valve Vmax measures 4.18 m/s.   7. The inferior vena cava is normal in size with greater than 50%  respiratory variability, suggesting right atrial pressure of 3 mmHg.     Neuro/Psych Seizures -,  CVA  negative psych ROS   GI/Hepatic negative GI  ROS, Neg liver ROS,,,  Endo/Other  negative endocrine ROS    Renal/GU negative Renal ROS     Musculoskeletal  (+) Arthritis ,    Abdominal   Peds  Hematology negative hematology ROS (+)   Anesthesia Other Findings   Reproductive/Obstetrics                             Anesthesia Physical Anesthesia Plan  ASA: 4  Anesthesia Plan: General   Post-op Pain Management:    Induction: Intravenous  PONV Risk Score and Plan: 2 and Ondansetron, Dexamethasone and Midazolam  Airway Management Planned: Oral ETT  Additional Equipment: Arterial line, CVP, TEE and Ultrasound Guidance Line Placement  Intra-op Plan:   Post-operative Plan: Post-operative intubation/ventilation  Informed Consent: I have reviewed the patients History and Physical, chart, labs and discussed the procedure including the risks, benefits and alternatives for the proposed anesthesia with the patient or authorized representative who has indicated his/her understanding and acceptance.     Dental advisory given  Plan Discussed with: CRNA  Anesthesia Plan Comments:        Anesthesia Quick Evaluation

## 2022-08-17 NOTE — Anesthesia Procedure Notes (Addendum)
Procedure Name: Intubation Date/Time: 08/17/2022 8:09 AM  Performed by: Nils Pyle, CRNAPre-anesthesia Checklist: Patient identified, Emergency Drugs available, Suction available and Patient being monitored Patient Re-evaluated:Patient Re-evaluated prior to induction Oxygen Delivery Method: Circle System Utilized Preoxygenation: Pre-oxygenation with 100% oxygen Induction Type: IV induction Ventilation: Mask ventilation without difficulty Laryngoscope Size: Mac and 4 Grade View: Grade I Tube type: Oral Tube size: 8.0 mm Number of attempts: 1 Airway Equipment and Method: Stylet Placement Confirmation: ETT inserted through vocal cords under direct vision, positive ETCO2 and breath sounds checked- equal and bilateral Secured at: 22 cm Tube secured with: Tape Dental Injury: Teeth and Oropharynx as per pre-operative assessment  Comments: Performed by sam foster srna

## 2022-08-17 NOTE — Anesthesia Procedure Notes (Signed)
Arterial Line Insertion Start/End7/29/2024 6:40 AM, 08/17/2022 6:56 AM Performed by: Nils Pyle, CRNA  Patient location: Pre-op. Preanesthetic checklist: patient identified, IV checked, site marked, risks and benefits discussed, surgical consent, monitors and equipment checked, pre-op evaluation and anesthesia consent Lidocaine 1% used for infiltration Left, radial was placed Catheter size: 20 G Hand hygiene performed  and maximum sterile barriers used   Attempts: 1 Procedure performed without using ultrasound guided technique. Following insertion, dressing applied and Biopatch. Post procedure assessment: normal and unchanged  Patient tolerated the procedure well with no immediate complications.

## 2022-08-17 NOTE — Progress Notes (Signed)
RN notified by radiology of X-ray results indicating possible right upper lobe collapse at 1423. Dr Cliffton Asters notified via phone at 1429. No new orders at this time.

## 2022-08-17 NOTE — Hospital Course (Addendum)
History of Present Illness:  Christopher Spencer is a 57 year old gentleman with history of hypertension, polysubstance abuse/smoking, hypertension, aortic valve stenosis who presented with chest pain.    This developed while at work.  Workup in ED ruled patient in for NSTEMI.  However, cardiac catheterization showed non-obstructive coronary artery disease.  Echocardiogram was obtained and showed severe Aortic Stenosis with LVH, and preserved EF.  It was felt surgical valve replacement would be indicated and he was referred to Dr. Cliffton Asters for evaluation.  He recommended surgical replacement.  The patient chose to have a mechanical valve.  He underwent dental clearance prior to surgery.  The risks and benefits of the procedure were explained to the patient and he was agreeable to proceed.  Hospital Course:    Christopher Spencer presented to [facility] on 08/17/2022.  He was taken to the operating room and underwent Aortic Valve Replacement utilizing a 25 mm Onyx-Mechanical Valve.  He tolerated the procedure without difficulty and was taken to the SICU in stable condition.  The patient was extubated the evening of surgery.  A line, chest tubes, and foley were removed early in his post operative course. He was started on Coumadin for his mechanical valve prosthesis. Daily PT/INR were obtained. He has an On X aortic valve so INR goal 2-2.5 for first three months then 1.5 afterward. He had expected post op blood loss anemia. He was transitioned off the Insulin drip. His pre op HGA1C 8.7. He will need close medical follow up after discharge. He will be provided nutrition information. He was started on Lopressor and this was titrated accordingly. He was hypertensive so Amlodipine was started on post op day 2. Epicardial pacing wires were removed on post op day 2. He had AKI post op. Creatinine went as high as 1.85 but then normalized to 1.09 on 08/02. He was felt surgically stable for transfer from the ICU to 4E for  further convalescence on 07/31. He went into a fib with HR into the 120's so he was started on an Amiodarone drip. He quickly converted to sinus rhythm and was transitioned to oral Amiodarone. He was still hypertensive so Losartan was restarted at a higher dose. Regarding diabetes management, Insulin was increased for better glucose control on 08/01. Diabetes coordinator was consulted as well. It was recommended to start Insulin to help with wound healing at discharge. The diabetes coordinator then personally contacted me on 08/02 to again recommend an Insulin pen (provided the patient can afford it). I informed her I started him on Metformin 500 mg bid as we try to use an oral agent first with newly diagnosed diabetes patient. She then recommended he be also be started on Metformin along with the Insulin. I am concerned about discharging on him on any Insulin at this point. He is homeless, but will be staying with his sister for a little while post op and he does not have an established PCP (we are trying to help him obtain one). He will be discharged on Metformin and not on Insulin. Ideally, if he can afford it, a dual oral agent could be considered in the future. We did discuss the importance of him obtaining a medical doctor for further diabetes management and surveillance of his HGA1C. He has been tolerating a diet and has had a bowel movement. His sternal wound is clean, dry, healing without signs of infection. He is ambulating on room air with good oxygenation. He is near  pre op weight, trace  LE edema, and small pleural effusions on last CXR. Will only continue Lasix for 4 more days. Also, he is still hypertensive so Losartan has been titrated to 100 mg daily. As stated previously, patient is homeless. He has made arrangements to stay with his sister post op for probably about one week then he will return to the shelter. As discussed with surgeon, he is stable for discharge today.

## 2022-08-17 NOTE — Op Note (Signed)
[address]       [place] [address]             [phone]                                           08/17/2022 Patient:  Christopher Spencer Pre-Op Dx: Severe Aortic Stenosis   Post-op Dx:  same Procedure: Aortic valve replacement with a 25mm On-X valve     Surgeon and Role:      * Wai Minotti, Eliezer Lofts, MD - Primary    * E. Barrett, PA -C  An experienced assistant was required given the complexity of this surgery and the standard of surgical care. The assistant was needed for exposure, dissection, suctioning, retraction of delicate tissues and sutures, instrument exchange and for overall help during this procedure.    Anesthesia  general EBL:  Blood Administration: none Xclamp Time:  95 min Pump Time:   Drains: 60 F blake drain:  mediastinal X 2 Wires: Ventricular Counts: correct   Indications: 57 year old male with severe aortic valve stenosis, and moderate mitral valve regurgitation.  On review of his echocardiogram the mitral valve regurgitation appears central.  He does not have any significant coronary artery disease.  We discussed the risks and benefits of surgical aortic valve replacement.  He was confused initially because he thought that a bioprosthetic valve meant that he was going to get a TAVR.  This has been clarified.  He would like to proceed with with a mechanical valve replacement.    Findings: Heavily calcified trileaflet valve.  Operative Technique: All invasive lines were placed in pre-op holding.  After the risks, benefits and alternatives were thoroughly discussed, the patient was brought to the operative theatre.  Anesthesia was induced, and the patient was prepped and draped in normal sterile fashion.  An appropriate surgical pause was performed, and pre-operative antibiotics were dosed accordingly.  We began with an incision over the chest for the sternotomy.  This was carried down with bovie cautery, and the sternum  was divided with a reciprocating saw.  Meticulous hemostasis was obtained.  The patient was systemically heparinized.   The sternal retractor was placed.  The pericardium was divided in the midline and fashioned into a cradle with pericardial stitches.   After we confirmed an appropriate ACT, the ascending aorta was cannulated in standard fashion.  The right atrial appendage was used for venous cannulation site.  Cardiopulmonary bypass was initiated and we began to cool the patient to 32 degrees. The cross clamp was applied, and a dose of anterograde cardioplegia was given with good arrest of the heart.  Our aortotomy was made and directed toward the non coronary cusp.  The valve was inspected.  All leaflets were excised.  The annulus was sized to a 25mm On-X valve.  The left ventricle was then copiously irrigated.  Pledgeted mattress sutures were placed circumferentially through the annulus.  These sutures were then passed through the sewing ring of the valve.  Once the valve was seated in the annulus, it was secured with Core-knot sutures.  We began to rewarm, and close our aortotomy in 2 layers.  A re-animation dose of cardioplegia was given.  After de-airing the heart, the aortic cross clamp was removed.  We checked our valve function, and for air using the TEE.  Once  we were satisfying, we separated from cardiopulmonary bypass without event.    The heparin was reversed with protamine, and hemostasis was obtained.  Chest tubes and wires were placed, and the sternum was re-approximated with with sternal wires.  The soft tissue and skin were re-approximated wth absorbable suture.    The patient tolerated the procedure without any immediate complications, and was transferred to the ICU in guarded condition.  Kaydenn Mclear Keane Scrape

## 2022-08-17 NOTE — Anesthesia Postprocedure Evaluation (Signed)
Anesthesia Post Note  Patient: Armanie Dossett Hohman  Procedure(s) Performed: AORTIC VALVE REPLACEMENT USING A 25 MM ON-X PROSTHETIC HEART VALVE (Chest) TRANSESOPHAGEAL ECHOCARDIOGRAM     Patient location during evaluation: SICU Anesthesia Type: General Level of consciousness: sedated Pain management: pain level controlled Vital Signs Assessment: post-procedure vital signs reviewed and stable Respiratory status: patient remains intubated per anesthesia plan Cardiovascular status: stable Postop Assessment: no apparent nausea or vomiting Anesthetic complications: no  No notable events documented.  Last Vitals:  Vitals:   08/17/22 1518 08/17/22 1600  BP:  104/65  Pulse:  64  Resp:  15  Temp:  37.1 C  SpO2: 100% 98%    Last Pain:  Vitals:   08/17/22 0612  TempSrc:   PainSc: 0-No pain                 Shelton Silvas

## 2022-08-18 ENCOUNTER — Inpatient Hospital Stay (HOSPITAL_COMMUNITY): Payer: Medicaid Other

## 2022-08-18 ENCOUNTER — Encounter (HOSPITAL_COMMUNITY): Payer: Self-pay | Admitting: Thoracic Surgery (Cardiothoracic Vascular Surgery)

## 2022-08-18 LAB — CBC
HCT: 38.4 % — ABNORMAL LOW (ref 39.0–52.0)
Hemoglobin: 12.1 g/dL — ABNORMAL LOW (ref 13.0–17.0)
MCH: 30.4 pg (ref 26.0–34.0)
MCHC: 31.5 g/dL (ref 30.0–36.0)
MCV: 96.5 fL (ref 80.0–100.0)
Platelets: 142 10*3/uL — ABNORMAL LOW (ref 150–400)
RBC: 3.98 MIL/uL — ABNORMAL LOW (ref 4.22–5.81)
RDW: 13.2 % (ref 11.5–15.5)
WBC: 12.7 10*3/uL — ABNORMAL HIGH (ref 4.0–10.5)
nRBC: 0 % (ref 0.0–0.2)

## 2022-08-18 LAB — GLUCOSE, CAPILLARY
Glucose-Capillary: 104 mg/dL — ABNORMAL HIGH (ref 70–99)
Glucose-Capillary: 112 mg/dL — ABNORMAL HIGH (ref 70–99)
Glucose-Capillary: 113 mg/dL — ABNORMAL HIGH (ref 70–99)
Glucose-Capillary: 117 mg/dL — ABNORMAL HIGH (ref 70–99)
Glucose-Capillary: 119 mg/dL — ABNORMAL HIGH (ref 70–99)
Glucose-Capillary: 124 mg/dL — ABNORMAL HIGH (ref 70–99)
Glucose-Capillary: 138 mg/dL — ABNORMAL HIGH (ref 70–99)
Glucose-Capillary: 143 mg/dL — ABNORMAL HIGH (ref 70–99)
Glucose-Capillary: 153 mg/dL — ABNORMAL HIGH (ref 70–99)
Glucose-Capillary: 154 mg/dL — ABNORMAL HIGH (ref 70–99)
Glucose-Capillary: 155 mg/dL — ABNORMAL HIGH (ref 70–99)
Glucose-Capillary: 155 mg/dL — ABNORMAL HIGH (ref 70–99)
Glucose-Capillary: 159 mg/dL — ABNORMAL HIGH (ref 70–99)
Glucose-Capillary: 160 mg/dL — ABNORMAL HIGH (ref 70–99)
Glucose-Capillary: 168 mg/dL — ABNORMAL HIGH (ref 70–99)
Glucose-Capillary: 175 mg/dL — ABNORMAL HIGH (ref 70–99)

## 2022-08-18 LAB — CREATININE, SERUM
Creatinine, Ser: 1.85 mg/dL — ABNORMAL HIGH (ref 0.61–1.24)
GFR, Estimated: 42 mL/min — ABNORMAL LOW (ref >60)

## 2022-08-18 MED ORDER — INSULIN ASPART 100 UNIT/ML IJ SOLN: 0.0000 [IU] | INTRAMUSCULAR | Status: DC

## 2022-08-18 MED ORDER — INSULIN ASPART 100 UNIT/ML IJ SOLN
0.0000 [IU] | INTRAMUSCULAR | Status: DC
  Administered 2022-08-18: 2 [IU] via SUBCUTANEOUS
  Administered 2022-08-18: 4 [IU] via SUBCUTANEOUS
  Administered 2022-08-18 – 2022-08-19 (×3): 2 [IU] via SUBCUTANEOUS
  Administered 2022-08-19: 8 [IU] via SUBCUTANEOUS
  Administered 2022-08-19: 2 [IU] via SUBCUTANEOUS

## 2022-08-18 MED ORDER — WARFARIN SODIUM 5 MG PO TABS
5.0000 mg | ORAL_TABLET | Freq: Every day | ORAL | Status: DC
  Administered 2022-08-18 – 2022-08-19 (×2): 5 mg via ORAL
  Filled 2022-08-18 (×2): qty 1

## 2022-08-18 MED ORDER — ENOXAPARIN SODIUM 40 MG/0.4ML IJ SOSY
40.0000 mg | PREFILLED_SYRINGE | Freq: Every day | INTRAMUSCULAR | Status: DC
  Administered 2022-08-18 – 2022-08-20 (×3): 40 mg via SUBCUTANEOUS
  Filled 2022-08-18 (×3): qty 0.4

## 2022-08-18 MED ORDER — INSULIN GLARGINE-YFGN 100 UNIT/ML ~~LOC~~ SOLN
15.0000 [IU] | Freq: Every day | SUBCUTANEOUS | Status: DC
  Administered 2022-08-18 – 2022-08-19 (×2): 15 [IU] via SUBCUTANEOUS
  Filled 2022-08-18 (×3): qty 0.15

## 2022-08-18 MED ORDER — ASPIRIN 81 MG PO TBEC
81.0000 mg | DELAYED_RELEASE_TABLET | Freq: Every day | ORAL | Status: DC
  Administered 2022-08-18 – 2022-08-21 (×4): 81 mg via ORAL
  Filled 2022-08-18 (×4): qty 1

## 2022-08-18 MED ORDER — METHOCARBAMOL 500 MG PO TABS
500.0000 mg | ORAL_TABLET | Freq: Three times a day (TID) | ORAL | Status: DC
  Administered 2022-08-18 – 2022-08-21 (×10): 500 mg via ORAL
  Filled 2022-08-18 (×10): qty 1

## 2022-08-18 MED ORDER — LIDOCAINE 5 % EX PTCH
1.0000 | MEDICATED_PATCH | CUTANEOUS | Status: DC
  Administered 2022-08-18 – 2022-08-21 (×4): 1 via TRANSDERMAL
  Filled 2022-08-18 (×4): qty 1

## 2022-08-18 MED ORDER — WARFARIN - PHYSICIAN DOSING INPATIENT: Freq: Every day | Status: DC

## 2022-08-18 MED FILL — Lidocaine HCl Local Preservative Free (PF) Inj 2%: INTRAMUSCULAR | Qty: 14 | Status: AC

## 2022-08-18 MED FILL — Heparin Sodium (Porcine) Inj 1000 Unit/ML: Qty: 1000 | Status: AC

## 2022-08-18 MED FILL — Potassium Chloride Inj 2 mEq/ML: INTRAVENOUS | Qty: 40 | Status: AC

## 2022-08-18 NOTE — Progress Notes (Signed)
Lopressor 2.5 mg PRN given for continued HTN after scheduled PO dose.

## 2022-08-18 NOTE — Progress Notes (Signed)
CSW spoke with patient and patients sister Christopher Spencer at bedside. Patient reports he is homeless and PTA he comes from [employer]. Patient and patients sister informed CSW they are hopeful that patient will be able to return back to [facility] shelter when medically stable. Patients sister reports that shelter told them he can return back to the shelter if he is medically stable.Patient with patients sister confirmed if patient is unable to stay with her by the time he is medically ready the plan will be for him to return to shelter.CSW offered patient Archivist and [employer]. Patient accepted. All questions answered. No further questions reported at this time.

## 2022-08-18 NOTE — Progress Notes (Signed)
Patient ID: Christopher Spencer, male   DOB: 1965-02-20, 57 y.o.   MRN: [id number] TCTS Evening Rounds:  Hemodynamically stable in sinus rhythm.  UO ok  Sats 97% 2L.  BMET    Component Value Date/Time   NA 132 (L) 08/18/2022 1743   K 4.7 08/18/2022 1743   CL 101 08/18/2022 1743   CO2 24 08/18/2022 1743   GLUCOSE 149 (H) 08/18/2022 1743   BUN 23 (H) 08/18/2022 1743   CREATININE 1.76 (H) 08/18/2022 1743   CALCIUM 8.6 (L) 08/18/2022 1743   GFRNONAA 45 (L) 08/18/2022 1743   Creat bumped up this am but a little lower this pm. Continue to observe.

## 2022-08-18 NOTE — Inpatient Diabetes Management (Signed)
Inpatient Diabetes Program Recommendations  AACE/ADA: New Consensus Statement on Inpatient Glycemic Control (2015)  Target Ranges:  Prepandial:   less than 140 mg/dL      Peak postprandial:   less than 180 mg/dL (1-2 hours)      Critically ill patients:  140 - 180 mg/dL   Lab Results  Component Value Date   GLUCAP 155 (H) 08/18/2022   HGBA1C 8.7 (H) 08/13/2022    Review of Glycemic Control  Latest Reference Range & Units 06/10/22 12:24 08/13/22 11:00  Hemoglobin A1C 4.8 - 5.6 % 8.1 (H) 8.7 (H)   Diabetes history: ? New diagnosis Outpatient Diabetes medications:  None Current orders for Inpatient glycemic control:  Transitioning off insulin drip to Novolog correction  Novolog 0-24 units q 4 hours  Inpatient Diabetes Program Recommendations:    Note elevated A1C of 8.7%- ? New diagnosis of DM.   Will follow.   Thanks,  Beryl Meager, RN, BC-ADM Inpatient Diabetes Coordinator Pager [phone]  (8a-5p)

## 2022-08-18 NOTE — Progress Notes (Signed)
NAME:  Christopher Spencer, MRN:  [id number], DOB:  1965-06-16, LOS: 1 ADMISSION DATE:  08/17/2022, CONSULTATION DATE: 08/17/2022 REFERRING MD: Cliffton Asters, CHIEF COMPLAINT: Postoperative ventilator management  History of Present Illness:  57 year old man who underwent uneventful mechanical AVR for aortic stenosis.  He originally presented as an NSTEMI was found to have nonobstructive coronary disease but marked aortic gradient due to aortic stenosis.  He had had symptoms of progressive shortness of breath.  Pertinent  Medical History   Past Medical History:  Diagnosis Date   Aortic stenosis    a. 11/2017 Echo: EF 60-65%. No rwma, Gr2 DD, mod AS (AVA 1.12 cm^2), mild AI/MR.   Arthritis    Cerebellar stroke (HCC)    a. 11/2017 in setting of drug abuse.   Elevated hemoglobin A1c    8.7% 08/13/22   Heart murmur    History of kidney stones    Hypertension    Polysubstance abuse (HCC)    Tobacco abuse    Significant Hospital Events: Including procedures, antibiotic start and stop dates in addition to other pertinent events   7/29-25 mm On-X valve aortic valve replacement. 7/30 extubated and stable   Interim History / Subjective:  Extubated and stable  Objective   Blood pressure 122/81, pulse 69, temperature 99.3 F (37.4 C), temperature source Bladder, resp. rate 11, height 5\' 9"  (1.753 m), weight 99.7 kg, SpO2 99%. CVP:  [9 mmHg-68 mmHg] 10 mmHg CO:  [3.3 L/min-7.5 L/min] 6.2 L/min CI:  [1.5 L/min/m2-3.5 L/min/m2] 2.9 L/min/m2  Vent Mode: PSV;CPAP FiO2 (%):  [40 %-50 %] 40 % Set Rate:  [4 bmp-16 bmp] 4 bmp Vt Set:  [560 mL] 560 mL PEEP:  [5 cmH20] 5 cmH20 Pressure Support:  [10 cmH20] 10 cmH20 Plateau Pressure:  [21 cmH20] 21 cmH20   Intake/Output Summary (Last 24 hours) at 08/18/2022 0844 Last data filed at 08/18/2022 0700 Gross per 24 hour  Intake 4764.42 ml  Output 3483 ml  Net 1281.42 ml   Filed Weights   08/17/22 0548 08/18/22 0200  Weight: 95.3 kg 99.7 kg     General:  well nourished M, resting in bed in no acute distress HEENT: MM pink/moist, sclera anicteric Neuro: alert and oriented CV: s1s2 rrr, no m/r/g PULM:  clear bilaterally without rhonchi or wheezing on [place] GI: soft, non-tender  Extremities: warm/dry, no edema  Skin: no rashes or lesions   Labs: Na 132 WBC 12 Platelets 131 Glucose 168 Creatinine 1.19  Ancillary tests personally reviewed  Postoperative labs pending  Assessment & Plan:    Calcific Aortic Stenosis s/p AVR Initially required pressors and mechanical ventilation -POD #1 progressing well and was extubated via rapid wean protocol -off pressors  -warfarin resumed -back up pacemaker at 60.  Blood pressure does not tolerate the pacing well. -Appropriate for fast-track extubation -Monitor chest tube output.  Correct coagulopathy if necessary. -Initiate multimodal pain control -encourage mobilization and IS -PCCM will continue to follow while inpatient    Thrombocytopenia platelets stable at 131 -continue to follow CBC  Mild Hyponatremia  Na 132,  -suspect will improve with increased po intake, follow BMP   Best Practice (right click and "Reselect all SmartList Selections" daily)   Diet/type: Regular consistency (see orders) DVT prophylaxis: SCD GI prophylaxis: H2B Lines: Central line Foley:  Yes, and it is still needed Code Status:  full code Last date of multidisciplinary goals of care discussion [Per primary team]     Darcella Gasman Mabel Unrein, PA-C [facility] See Amion for pager If no response to pager , please call [phone] until 7pm After 7:00 pm call Elink  [phone]

## 2022-08-19 ENCOUNTER — Inpatient Hospital Stay (HOSPITAL_COMMUNITY): Payer: Medicaid Other

## 2022-08-19 ENCOUNTER — Other Ambulatory Visit: Payer: Self-pay | Admitting: Physician Assistant

## 2022-08-19 ENCOUNTER — Encounter: Payer: Self-pay | Admitting: Cardiovascular Disease

## 2022-08-19 ENCOUNTER — Telehealth: Payer: Self-pay | Admitting: Medical

## 2022-08-19 DIAGNOSIS — Z952 Presence of prosthetic heart valve: Secondary | ICD-10-CM

## 2022-08-19 LAB — GLUCOSE, CAPILLARY
Glucose-Capillary: 152 mg/dL — ABNORMAL HIGH (ref 70–99)
Glucose-Capillary: 135 mg/dL — ABNORMAL HIGH (ref 70–99)
Glucose-Capillary: 219 mg/dL — ABNORMAL HIGH (ref 70–99)
Glucose-Capillary: 234 mg/dL — ABNORMAL HIGH (ref 70–99)
Glucose-Capillary: 269 mg/dL — ABNORMAL HIGH (ref 70–99)

## 2022-08-19 LAB — PROTIME-INR
INR: 1.3 — ABNORMAL HIGH (ref 0.8–1.2)
Prothrombin Time: 16.3 seconds — ABNORMAL HIGH (ref 11.4–15.2)

## 2022-08-19 MED ORDER — HYDRALAZINE HCL 20 MG/ML IJ SOLN
10.0000 mg | Freq: Four times a day (QID) | INTRAMUSCULAR | Status: DC | PRN
  Administered 2022-08-19: 10 mg via INTRAVENOUS
  Filled 2022-08-19: qty 1

## 2022-08-19 MED ORDER — HYDROXYZINE HCL 25 MG PO TABS
25.0000 mg | ORAL_TABLET | Freq: Three times a day (TID) | ORAL | Status: DC | PRN
  Administered 2022-08-19: 25 mg via ORAL
  Filled 2022-08-19: qty 1

## 2022-08-19 MED ORDER — LABETALOL HCL 5 MG/ML IV SOLN
10.0000 mg | INTRAVENOUS | Status: DC | PRN
  Administered 2022-08-19 – 2022-08-20 (×5): 10 mg via INTRAVENOUS
  Filled 2022-08-19 (×4): qty 4

## 2022-08-19 MED ORDER — SODIUM CHLORIDE 0.9 % IV SOLN: 250.0000 mL | INTRAVENOUS | Status: DC | PRN

## 2022-08-19 MED ORDER — INSULIN ASPART 100 UNIT/ML IJ SOLN
0.0000 [IU] | Freq: Four times a day (QID) | INTRAMUSCULAR | Status: DC
  Administered 2022-08-19: 8 [IU] via SUBCUTANEOUS
  Administered 2022-08-20 (×2): 4 [IU] via SUBCUTANEOUS
  Administered 2022-08-20: 12 [IU] via SUBCUTANEOUS
  Administered 2022-08-20: 8 [IU] via SUBCUTANEOUS
  Administered 2022-08-20 – 2022-08-21 (×2): 4 [IU] via SUBCUTANEOUS

## 2022-08-19 MED ORDER — AMIODARONE LOAD VIA INFUSION
150.0000 mg | Freq: Once | INTRAVENOUS | Status: AC
  Administered 2022-08-19: 150 mg via INTRAVENOUS
  Filled 2022-08-19: qty 83.34

## 2022-08-19 MED ORDER — POLYETHYLENE GLYCOL 3350 17 G PO PACK
17.0000 g | PACK | Freq: Every day | ORAL | Status: DC
  Administered 2022-08-19 – 2022-08-20 (×2): 17 g via ORAL
  Filled 2022-08-19 (×3): qty 1

## 2022-08-19 MED ORDER — TRAMADOL HCL 50 MG PO TABS: 50.0000 mg | ORAL_TABLET | Freq: Two times a day (BID) | ORAL | Status: DC | PRN

## 2022-08-19 MED ORDER — SODIUM CHLORIDE 0.9% FLUSH
3.0000 mL | Freq: Two times a day (BID) | INTRAVENOUS | Status: DC
  Administered 2022-08-19 – 2022-08-21 (×3): 3 mL via INTRAVENOUS

## 2022-08-19 MED ORDER — AMLODIPINE BESYLATE 10 MG PO TABS
10.0000 mg | ORAL_TABLET | Freq: Every day | ORAL | Status: DC
  Administered 2022-08-19 – 2022-08-21 (×3): 10 mg via ORAL
  Filled 2022-08-19 (×3): qty 1

## 2022-08-19 MED ORDER — POTASSIUM CHLORIDE CRYS ER 20 MEQ PO TBCR
20.0000 meq | EXTENDED_RELEASE_TABLET | Freq: Once | ORAL | Status: AC
  Administered 2022-08-19: 20 meq via ORAL
  Filled 2022-08-19: qty 1

## 2022-08-19 MED ORDER — CARVEDILOL 12.5 MG PO TABS
12.5000 mg | ORAL_TABLET | Freq: Two times a day (BID) | ORAL | Status: DC
  Administered 2022-08-19 – 2022-08-21 (×5): 12.5 mg via ORAL
  Filled 2022-08-19 (×5): qty 1

## 2022-08-19 MED ORDER — ~~LOC~~ CARDIAC SURGERY, PATIENT & FAMILY EDUCATION: Freq: Once | Status: AC

## 2022-08-19 MED ORDER — METOPROLOL TARTRATE 25 MG/10 ML ORAL SUSPENSION: 25.0000 mg | Freq: Two times a day (BID) | ORAL | Status: DC

## 2022-08-19 MED ORDER — AMIODARONE HCL IN DEXTROSE 360-4.14 MG/200ML-% IV SOLN
30.0000 mg/h | INTRAVENOUS | Status: DC
  Filled 2022-08-19: qty 200

## 2022-08-19 MED ORDER — AMIODARONE HCL IN DEXTROSE 360-4.14 MG/200ML-% IV SOLN
60.0000 mg/h | INTRAVENOUS | Status: DC
  Administered 2022-08-19 – 2022-08-20 (×2): 60 mg/h via INTRAVENOUS
  Filled 2022-08-19 (×2): qty 200

## 2022-08-19 MED ORDER — SODIUM CHLORIDE 0.9% FLUSH: 3.0000 mL | INTRAVENOUS | Status: DC | PRN

## 2022-08-19 MED ORDER — METOPROLOL TARTRATE 25 MG PO TABS
25.0000 mg | ORAL_TABLET | Freq: Two times a day (BID) | ORAL | Status: DC
  Filled 2022-08-19: qty 1

## 2022-08-19 MED ORDER — FUROSEMIDE 10 MG/ML IJ SOLN
40.0000 mg | Freq: Once | INTRAMUSCULAR | Status: AC
  Administered 2022-08-19: 40 mg via INTRAVENOUS
  Filled 2022-08-19: qty 4

## 2022-08-19 MED FILL — Heparin Sodium (Porcine) Inj 1000 Unit/ML: INTRAMUSCULAR | Qty: 2500 | Status: AC

## 2022-08-19 MED FILL — Sodium Bicarbonate IV Soln 8.4%: INTRAVENOUS | Qty: 50 | Status: AC

## 2022-08-19 MED FILL — Electrolyte-A Solution: INTRAVENOUS | Qty: 500 | Status: AC

## 2022-08-19 MED FILL — Calcium Chloride Inj 10%: INTRAVENOUS | Qty: 10 | Status: AC

## 2022-08-19 MED FILL — Heparin Sodium (Porcine) Inj 1000 Unit/ML: INTRAMUSCULAR | Qty: 10 | Status: AC

## 2022-08-19 MED FILL — Electrolyte-R (PH 7.4) Solution: INTRAVENOUS | Qty: 5000 | Status: AC

## 2022-08-19 MED FILL — Papaverine HCl Inj 30 MG/ML: INTRAMUSCULAR | Qty: 1 | Status: AC

## 2022-08-19 MED FILL — Sodium Chloride IV Soln 0.9%: INTRAVENOUS | Qty: 2000 | Status: AC

## 2022-08-19 NOTE — Telephone Encounter (Signed)
TOC appt scheduled with Cadence Furth, PA 08/23 at 3:35 P.M. per Azalee Course, PA

## 2022-08-19 NOTE — Progress Notes (Addendum)
[facility] DAILY ICU PROGRESS NOTE                   [address]            [place] [address]          [phone]   2 Days Post-Op Procedure(s) (LRB): AORTIC VALVE REPLACEMENT USING A 25 MM ON-X PROSTHETIC HEART VALVE (N/A) TRANSESOPHAGEAL ECHOCARDIOGRAM (N/A)  Total Length of Stay:  LOS: 2 days   Subjective: Patient sitting in chair. He has no specific complaint this am.  Objective: Vital signs in last 24 hours: Temp:  [97.7 F (36.5 C)-99.1 F (37.3 C)] 98.4 F (36.9 C) (07/31 0400) Pulse Rate:  [69-82] 73 (07/31 0611) Cardiac Rhythm: Normal sinus rhythm (07/31 0600) Resp:  [11-25] 12 (07/31 0611) BP: (122-189)/(73-113) 170/88 (07/31 0611) SpO2:  [88 %-100 %] 93 % (07/31 9147) Arterial Line BP: (145)/(57) 145/57 (07/30 0800) Weight:  [100.9 kg] 100.9 kg (07/31 0500)  Filed Weights   08/17/22 0548 08/18/22 0200 08/19/22 0500  Weight: 95.3 kg 99.7 kg 100.9 kg    Weight change: 1.2 kg   Hemodynamic parameters for last 24 hours: CVP:  [9 mmHg] 9 mmHg CO:  [7.1 L/min] 7.1 L/min CI:  [3.4 L/min/m2] 3.4 L/min/m2  Intake/Output from previous day: 07/30 0701 - 07/31 0700 In: 1520.5 [P.O.:1030; I.V.:290.5; IV Piggyback:200] Out: 1195 [Urine:1175; Chest Tube:20]  Intake/Output this shift: No intake/output data recorded.  Current Meds: Scheduled Meds:  acetaminophen  1,000 mg Oral Q6H   Or   acetaminophen (TYLENOL) oral liquid 160 mg/5 mL  1,000 mg Per Tube Q6H   aspirin EC  81 mg Oral Daily   atorvastatin  80 mg Oral Daily   bisacodyl  10 mg Oral Daily   Or   bisacodyl  10 mg Rectal Daily   Chlorhexidine Gluconate Cloth  6 each Topical Daily   docusate sodium  200 mg Oral Daily   enoxaparin (LOVENOX) injection  40 mg Subcutaneous QHS   insulin aspart  0-24 Units Subcutaneous Q4H   insulin glargine-yfgn  15 Units Subcutaneous Daily   lidocaine  1 patch Transdermal Q24H   methocarbamol  500 mg Oral TID   metoprolol tartrate  12.5 mg Oral BID    Or   metoprolol tartrate  12.5 mg Per Tube BID   pantoprazole  40 mg Oral Daily   sodium chloride flush  3 mL Intravenous Q12H   warfarin  5 mg Oral q1600   Warfarin - Physician Dosing Inpatient   Does not apply q1600   Continuous Infusions:  sodium chloride Stopped (08/18/22 0121)   sodium chloride     sodium chloride 10 mL/hr at 08/19/22 0600   albumin human Stopped (08/17/22 1335)    ceFAZolin (ANCEF) IV 2 g (08/19/22 0610)   PRN Meds:.sodium chloride, albumin human, metoprolol tartrate, morphine injection, ondansetron (ZOFRAN) IV, mouth rinse, oxyCODONE, sodium chloride flush, traMADol  General appearance: alert, cooperative, and no distress Neurologic: intact Heart: RRR, sharp valve click, no murmur Lungs: Diminished bibasilar breath sounds Abdomen: Soft, non tender, bowel sounds present Extremities: Mild LE edema Wound: Aquacel intact  Lab Results: CBC: Recent Labs    08/18/22 1027 08/19/22 0408  WBC 12.7* 13.8*  HGB 12.1* 11.7*  HCT 38.4* 36.7*  PLT 142* 151   BMET:  Recent Labs    08/18/22 1743 08/19/22 0408  NA 132* 131*  K 4.7 4.9  CL 101 99  CO2 24 24  GLUCOSE 149*  132*  BUN 23* 25*  CREATININE 1.76* 1.67*  CALCIUM 8.6* 8.7*    CMET: Lab Results  Component Value Date   WBC 13.8 (H) 08/19/2022   HGB 11.7 (L) 08/19/2022   HCT 36.7 (L) 08/19/2022   PLT 151 08/19/2022   GLUCOSE 132 (H) 08/19/2022   CHOL 163 06/10/2022   TRIG 95 06/10/2022   HDL 50 06/10/2022   LDLCALC 94 06/10/2022   ALT 46 (H) 08/13/2022   AST 25 08/13/2022   NA 131 (L) 08/19/2022   K 4.9 08/19/2022   CL 99 08/19/2022   CREATININE 1.67 (H) 08/19/2022   BUN 25 (H) 08/19/2022   CO2 24 08/19/2022   TSH 0.552 12/04/2017   INR 1.3 (H) 08/17/2022   HGBA1C 8.7 (H) 08/13/2022      PT/INR:  Recent Labs    08/17/22 1242  LABPROT 16.5*  INR 1.3*   Radiology: No results found.   Assessment/Plan: S/P Procedure(s) (LRB): AORTIC VALVE REPLACEMENT USING A 25 MM ON-X  PROSTHETIC HEART VALVE (N/A) TRANSESOPHAGEAL ECHOCARDIOGRAM (N/A)  CV-Hypertensive and given several doses of IV Lopressor. On Lopressor 12.5 mg bid and will increase. Will start Amlodipine and give PRN IV medication. Unable to restart Losartan as creatinine above normal. On Coumadin for On X aortic valve replacement. INR not drawn this am so will check and dose Coumadin appropriately. Pulmonary-on 4 liters of oxygen. Wean as able. CXR this am appears stable-cardiomegaly, no pneumothorax. Encourage incentive spirometer. CBGs 175/152/135. Pre op HGA1C 8.7. On Insulin. Will try to start oral agent once creatinine normalized. He will need close follow up with medical doctor after discharge. Expected post op blood loss anemia-H and H this am slightly decreased to 11.7 and 36.7. Mild thrombocytopenia resolved-platelets this am 151,000 AKI-creatinine this am slightly decreased from 1.76 to 1.67. 7. Remove sleeve and EPW 8. Transfer to 4E  Ardelle Balls PA-C 08/19/2022 7:44 AM

## 2022-08-19 NOTE — Progress Notes (Signed)
Lopressor 2.5 mg IVP x2 for SBP greater than 160.  Refer to [facility].

## 2022-08-19 NOTE — Progress Notes (Signed)
Lopressor 2.5 mg IVP given for HTN.

## 2022-08-19 NOTE — Telephone Encounter (Signed)
Error

## 2022-08-19 NOTE — Progress Notes (Signed)
Pt admitted to rm 8 from [facility]. Initiated tele. Oriented pt to the unit. VSS. Call bell within reach.   Lawson Radar, RN

## 2022-08-19 NOTE — TOC Initial Note (Addendum)
Transition of Care [facility]) - Initial/Assessment Note    Patient Details  Name: Christopher Spencer MRN: [id number] Date of Birth: 10-Jan-1966  [facility] CM/SW Contact:    Christopher Spencer, LCSWA Phone Number: 08/19/2022, 2:25 PM  Clinical Narrative:                  CSW spoke with patient and patients sister Christopher Spencer at bedside on 7/30. Patient reports he is homeless and PTA he comes from [facility]. Patient and patients sister informed CSW they are hopeful that patient will be able to return back to [facility] shelter when medically stable. Patients sister reports that shelter told them he can return back to the shelter if he is medically stable.Patient with patients sister confirmed if patient is unable to stay with her by the time he is medically ready the plan will be for him to return to shelter.CSW offered patient [employer] and [employer]. Patient accepted. All questions answered. No further questions reported at this time. CM will follow for medication assistance.TOC will continue to follow.       Patient Goals and CMS Choice            Expected Discharge Plan and Services                                              Prior Living Arrangements/Services                       Activities of Daily Living      Permission Sought/Granted                  Emotional Assessment              Admission diagnosis:  S/P AVR [Z95.2] Patient Active Problem List   Diagnosis Date Noted   S/P AVR 08/17/2022   Non-ST elevation (NSTEMI) myocardial infarction (HCC) 06/11/2022   Severe aortic valve stenosis 06/11/2022   Ventricular hypertrophy 06/11/2022   Hypertensive heart disease with heart failure (HCC) 06/11/2022   Pulmonary hypertension, unspecified (HCC) 06/11/2022   Chest pain 05/22/2021   Cocaine abuse (HCC) 05/22/2021   Essential hypertension 05/22/2021   Hypokalemia 05/22/2021   Does  not have primary care provider 05/22/2021   Seizure (HCC) 12/04/2017   PCP:  Patient, No Pcp Per Pharmacy:   [facility] [address] ([address]),  - [address]) [place] [address] Phone: [phone] Fax: [phone]  [facility] [address] [place] [address] Phone: [phone] Fax: [phone]     Social Determinants of Health (SDOH) Social History: SDOH Screenings   Food Insecurity: No Food Insecurity (06/11/2022)  Housing: High Risk (06/11/2022)  Transportation Needs: No Transportation Needs (06/11/2022)  Utilities: Not At Risk (06/11/2022)  Financial Resource Strain: High Risk (12/04/2017)  Physical Activity: Sufficiently Active (12/04/2017)  Social Connections: Unknown (12/04/2017)  Stress: Stress Concern Present (12/04/2017)  Tobacco Use: High Risk (08/17/2022)   SDOH Interventions:     Readmission Risk Interventions     No data to display

## 2022-08-19 NOTE — Progress Notes (Addendum)
NAME:  Christopher Spencer, MRN:  [id number], DOB:  [birth year], LOS: 2 ADMISSION DATE:  08/17/2022, CONSULTATION DATE: 08/17/2022 REFERRING MD: Cliffton Asters, CHIEF COMPLAINT: Postoperative ventilator management  History of Present Illness:  57 year old man who underwent uneventful mechanical AVR for aortic stenosis.  He originally presented as an NSTEMI was found to have nonobstructive coronary disease but marked aortic gradient due to aortic stenosis.  He had had symptoms of progressive shortness of breath.  Pertinent  Medical History   Past Medical History:  Diagnosis Date   Aortic stenosis    a. 11/2017 Echo: EF 60-65%. No rwma, Gr2 DD, mod AS (AVA 1.12 cm^2), mild AI/MR.   Arthritis    Cerebellar stroke (HCC)    a. 11/2017 in setting of drug abuse.   Elevated hemoglobin A1c    8.7% 08/13/22   Heart murmur    History of kidney stones    Hypertension    Polysubstance abuse (HCC)    Tobacco abuse    Significant Hospital Events: Including procedures, antibiotic start and stop dates in addition to other pertinent events   7/29-25 mm On-X valve aortic valve replacement. 7/30 extubated and stable  7/31 progressing, BP up   Interim History / Subjective:  Stable, no overnight events Pt is c/o some anxiety and pain Feels bloated, no BM  Objective   Blood pressure (!) 193/98, pulse 76, temperature 98 F (36.7 C), temperature source Oral, resp. rate (!) 22, height 5\' 9"  (1.753 m), weight 100.9 kg, SpO2 98%.        Intake/Output Summary (Last 24 hours) at 08/19/2022 1027 Last data filed at 08/19/2022 0800 Gross per 24 hour  Intake 1578.88 ml  Output 1175 ml  Net 403.88 ml   Filed Weights   08/17/22 0548 08/18/22 0200 08/19/22 0500  Weight: 95.3 kg 99.7 kg 100.9 kg    General:  well nourished M, mildly anxious appearing in no acute distress  HEENT: MM pink/moist, sclera anicteric Neuro: alert and oriented CV: s1s2 rrr, no m/r/g PULM:  clear bilaterally without rhonchi or  wheezing on [place] GI: soft, mildly distended Extremities: warm/dry, no edema  Skin: no rashes or lesions   Labs: Na 131 WBC 13.8 Platelets 151 Glucose 219 Creatinine 1.67  Ancillary tests personally reviewed  Postoperative labs pending  Assessment & Plan:    Calcific Aortic Stenosis s/p AVR HTN Initially required pressors and mechanical ventilation -POD #2 progressing well and was extubated via rapid wean protocol -stable and now hypertensive, resumed Coreg at 12.5mg  bid, started amlodipine 10mg , prn labetalol  -On Asa and statin  -chest tube and pacing wires out today -Initiate multimodal pain control -encourage mobilization and IS -PCCM will continue to follow while inpatient    Thrombocytopenia Improved  -continue to follow CBC  Mild Hyponatremia  Na 131 -mild, continue to follow BMP   AKI Since admission creatinine 1.19>1.8>1.7>1.6 Making adequate urine 0.5cc/kg/hr -continue to monitor renal indices and avoid nephrotoxins   Constipation -add miralax to colace  Best Practice (right click and "Reselect all SmartList Selections" daily)   Diet/type: Regular consistency (see orders) DVT prophylaxis: SCD GI prophylaxis: H2B Lines: Central line Foley:  Yes, and it is still needed Code Status:  full code Last date of multidisciplinary goals of care discussion [Per primary team]     Darcella Gasman Tatelyn Vanhecke, PA-C [facility] See Amion for pager If no response to pager , please call [phone] until 7pm After 7:00 pm call Elink  [phone]

## 2022-08-19 NOTE — Progress Notes (Addendum)
Lopressor 2.5 mg IVP given for continued HTN.

## 2022-08-19 NOTE — Discharge Summary (Cosign Needed Addendum)
[address]       [place] [address]             [phone]    Physician Discharge Summary  Patient ID: Christopher Spencer MRN: [id number] DOB/AGE: 1965/12/10 57 y.o.  Admit date: 08/17/2022 Discharge date: 08/21/2022  Admission Diagnoses:  Patient Active Problem List   Diagnosis Date Noted   S/P AVR 08/17/2022   Non-ST elevation (NSTEMI) myocardial infarction (HCC) 06/11/2022   Severe aortic valve stenosis 06/11/2022   Ventricular hypertrophy 06/11/2022   Hypertensive heart disease with heart failure (HCC) 06/11/2022   Pulmonary hypertension, unspecified (HCC) 06/11/2022   Chest pain 05/22/2021   Cocaine abuse (HCC) 05/22/2021   Essential hypertension 05/22/2021   Hypokalemia 05/22/2021   Does not have primary care provider 05/22/2021   Seizure (HCC) 12/04/2017     Discharge Diagnoses:  Patient Active Problem List   Diagnosis Date Noted   S/P AVR 08/17/2022   Non-ST elevation (NSTEMI) myocardial infarction (HCC) 06/11/2022   Severe aortic valve stenosis 06/11/2022   Ventricular hypertrophy 06/11/2022   Hypertensive heart disease with heart failure (HCC) 06/11/2022   Pulmonary hypertension, unspecified (HCC) 06/11/2022   Chest pain 05/22/2021   Cocaine abuse (HCC) 05/22/2021   Essential hypertension 05/22/2021   Hypokalemia 05/22/2021   Does not have primary care provider 05/22/2021   Seizure (HCC) 12/04/2017  Post op atrial fibrillation with conversion to sinus rhythm AKI (resolved prior to discharge)   Discharged Condition: Stable  History of Present Illness:  Christopher Spencer is a 57 year old gentleman with history of hypertension, polysubstance abuse/smoking, hypertension, aortic valve stenosis who presented with chest pain.    This developed while at work.  Workup in ED ruled patient in for NSTEMI.  However, cardiac catheterization showed non-obstructive coronary artery disease.  Echocardiogram was obtained and showed severe Aortic  Stenosis with LVH, and preserved EF.  It was felt surgical valve replacement would be indicated and he was referred to Dr. Cliffton Asters for evaluation.  He recommended surgical replacement.  The patient chose to have a mechanical valve.  He underwent dental clearance prior to surgery.  The risks and benefits of the procedure were explained to the patient and he was agreeable to proceed.  Hospital Course:    Christopher Spencer presented to [facility] on 08/17/2022.  He was taken to the operating room and underwent Aortic Valve Replacement utilizing a 25 mm Onyx-Mechanical Valve.  He tolerated the procedure without difficulty and was taken to the SICU in stable condition.  The patient was extubated the evening of surgery.  A line, chest tubes, and foley were removed early in his post operative course. He was started on Coumadin for his mechanical valve prosthesis. Daily PT/INR were obtained. He has an On X aortic valve so INR goal 2-2.5 for first three months then 1.5 afterward. He had expected post op blood loss anemia. He was transitioned off the Insulin drip. His pre op HGA1C 8.7. He will need close medical follow up after discharge. He will be provided nutrition information. He was started on Lopressor and this was titrated accordingly. He was hypertensive so Amlodipine was started on post op day 2. Epicardial pacing wires were removed on post op day 2. He had AKI post op. Creatinine went as high as 1.85 but then normalized to 1.09 on 08/02. He was felt surgically stable for transfer from the ICU to 4E for further convalescence on 07/31. He went into a fib  with HR into the 120's so he was started on an Amiodarone drip. He quickly converted to sinus rhythm and was transitioned to oral Amiodarone. He was still hypertensive so Losartan was restarted at a higher dose. Regarding diabetes management, Insulin was increased for better glucose control on 08/01. Diabetes coordinator was consulted as well. It was  recommended to start Insulin to help with wound healing at discharge. The diabetes coordinator then personally contacted me on 08/02 to again recommend an Insulin pen (provided the patient can afford it). I informed her I started him on Metformin 500 mg bid as we try to use an oral agent first with newly diagnosed diabetes patient. She then recommended he be also be started on Metformin along with the Insulin. I am concerned about discharging on him on any Insulin at this point. He is homeless, but will be staying with his sister for a little while post op and he does not have an established PCP (we are trying to help him obtain one). He will be discharged on Metformin and not on Insulin. Ideally, if he can afford it, a dual oral agent could be considered in the future. We did discuss the importance of him obtaining a medical doctor for further diabetes management and surveillance of his HGA1C. He has been tolerating a diet and has had a bowel movement. His sternal wound is clean, dry, healing without signs of infection. He is ambulating on room air with good oxygenation. He is near  pre op weight, trace LE edema, and small pleural effusions on last CXR. Will only continue Lasix for 4 more days. Also, he is still hypertensive so Losartan has been titrated to 100 mg daily. As stated previously, patient is homeless. He has made arrangements to stay with his sister post op for probably about one week then he will return to the shelter. As discussed with surgeon, he is stable for discharge today.  Consults: pulmonary/intensive care  Significant Diagnostic Studies:   Narrative & Impression  CLINICAL DATA:  Evaluate for pneumothorax. Status post aortic valve replacement.   EXAM: CHEST - 2 VIEW   COMPARISON:  08/19/2022   FINDINGS: Status post median sternotomy and aortic valve replacement. Stable cardiac enlargement. Lung volumes are low. Diffuse pulmonary vascular congestion is unchanged. Blunting of  the costophrenic angles concerning for small pleural effusions. Decreased aeration to the left base is favored to represent atelectasis. New subsegmental atelectasis noted in the right apex. No appreciable pneumothorax identified.   IMPRESSION: 1. No appreciable pneumothorax identified. 2. Low lung volumes with pulmonary vascular congestion and small pleural effusions. 3. Decreased aeration to the left base is favored to represent atelectasis.     Electronically Signed   By: Signa Kell M.D.   On: 08/20/2022 08:10   CLINICAL DATA:  Follow-up aortic valve replacement   EXAM: PORTABLE CHEST 1 VIEW   COMPARISON:  08/17/2022   FINDINGS: Endotracheal tube and nasogastric tube have been removed. Mediastinal drains remain in place. Right internal jugular central line remains in place. No pneumothorax. Cardiomegaly as seen previously. Previous aortic valve replacement. The right lung is clear. There is mild volume loss in the left lower lobe, slightly worsened following extubation. There is improved aeration of the right upper lobe.   IMPRESSION: 1. Endotracheal tube and nasogastric tube removed. 2. No pneumothorax. 3. Slight worsening of volume loss in the left lower lobe following extubation. Improved aeration of the right upper lobe.     Electronically Signed   By:  Paulina Fusi M.D.   On: 08/18/2022 10:03   Treatments: surgery:  Aortic valve replacement with a 25mm On-X valve by Dr. Cliffton Asters on 08/17/2022.  Discharge Exam: Blood pressure (!) 178/91, pulse 85, temperature 98.8 F (37.1 C), temperature source Oral, resp. rate (!) 24, height 5\' 9"  (1.753 m), weight 96.3 kg, SpO2 96%. Cardiovascular: RRR, no murmur Pulmonary: Clear to auscultation bilaterally Abdomen: Soft, non tender, bowel sounds present. Extremities: Trace bilateral lower extremity edema. Wound: Sternal wound is clean and dry.  No erythema or signs of infection.     Discharge  Medications:  The patient has been discharged on:   1.Beta Blocker:  Yes [ x  ]                              No   [   ]                              If No, reason:  2.Ace Inhibitor/ARB: Yes [   ]                                     No  [  x  ]                                     If No, reason:  3.Statin:   Yes [ x  ]                  No  [   ]                  If No, reason:  4.Ecasa:  Yes  [  x ]                  No   [   ]                  If No, reason:  Patient had ACS upon admission:No  Plavix/P2Y12 inhibitor: Yes [   ]                                      No  [ x  ]     Discharge Instructions     Amb Referral to Cardiac Rehabilitation   Complete by: As directed    Diagnosis: Valve Replacement   Valve: Aortic   After initial evaluation and assessments completed: Virtual Based Care may be provided alone or in conjunction with Phase 2 Cardiac Rehab based on patient barriers.: Yes   Intensive Cardiac Rehabilitation (ICR) [facility] location only OR Traditional Cardiac Rehabilitation (TCR) *If criteria for ICR are not met will enroll in TCR [facility] only): Yes      Allergies as of 08/21/2022   No Known Allergies      Medication List     TAKE these medications    Accu-Chek Guide test strip Generic drug: glucose blood Use 1 strip in the morning, at noon, and at bedtime. May substitute to any manufacturer covered by patient's insurance.   Accu-Chek Guide w/Device Kit Use 1 in the morning, at noon, and at bedtime.  Accu-Chek Softclix Lancets lancets Use 1 each in the morning, at noon, and at bedtime.   amiodarone 200 MG tablet Commonly known as: PACERONE Take 200 mg two times daily for 10 days;then take 200 mg daily thereafter   amLODipine 10 MG tablet Commonly known as: NORVASC Take 1 tablet (10 mg total) by mouth daily.   aspirin EC 81 MG tablet Take 1 tablet (81 mg total) by mouth daily.   atorvastatin 80 MG tablet Commonly known as: LIPITOR Take 1 tablet  (80 mg total) by mouth daily.   carvedilol 12.5 MG tablet Commonly known as: COREG Take 1 tablet (12.5 mg total) by mouth 2 (two) times daily with a meal. What changed:  medication strength how much to take   furosemide 40 MG tablet Commonly known as: LASIX Take 1 tablet (40 mg total) by mouth daily. For 4 days then stop. Start taking on: August 22, 2022   Lancet Device Misc 1 each by Does not apply route in the morning, at noon, and at bedtime. May substitute to any manufacturer covered by patient's insurance.   losartan 100 MG tablet Commonly known as: COZAAR Take 1 tablet (100 mg total) by mouth daily. What changed:  medication strength how much to take   metFORMIN 500 MG tablet Commonly known as: GLUCOPHAGE Take 1 tablet (500 mg total) by mouth 2 (two) times daily with a meal.   oxyCODONE 5 MG immediate release tablet Commonly known as: Oxy IR/ROXICODONE Take 1 tablet (5 mg total) by mouth every 6 (six) hours as needed for severe pain.   potassium chloride SA 20 MEQ tablet Commonly known as: KLOR-CON M Take 1 tablet (20 mEq total) by mouth daily. For 4 days then stop.   warfarin 4 MG tablet Commonly known as: COUMADIN Take 1 tablet (4 mg total) by mouth daily at 4 PM. Or as directed        Follow-up Information     Lightfoot, Eliezer Lofts, MD Follow up.   Specialty: Cardiothoracic Surgery Why: Appointment is VIRTUAL (via telephone). Dr. Cliffton Asters will call on 08/09 at 12:40 pm Contact information: [address] [place] [address] [phone]         [facility] Follow up on 10/05/2022.   Specialty: Cardiology Why: 6 week postop echo after aortic valve replacement at 10:30AM. Contact information: [address], [address] [place] [address] [phone]        [facility] [place] Follow up on 08/26/2022.   Specialty: Cardiology Why: Appointment is to have PT/INR drawn. Coumadin clinic visit.  Appointment time is at 1:15PM. Contact information: [address], [address] [place] [address] [phone]        Medical Doctor Follow up.   Why: Please obtain a medical doctor for new diabetes management and surveillance of HGA1C 8.7        Furth, Cadence H, PA-C Follow up on 09/11/2022.   Specialty: Cardiology Why: 3;35PM. Cardiology follow up with Dr. Windell Hummingbird PA Contact information: [address] [place] [address] [phone]         Bethanie Dicker, NP Follow up.   Specialty: Nurse Practitioner Why: TIME : 1:30 PM DATE: AUG 06 ,2024 Contact information: [address] [place] [address] [place] [address] [phone]                 Signed:  Ardelle Balls, PA-C 08/21/2022, 1:07 PM

## 2022-08-19 NOTE — Discharge Instructions (Addendum)
Discharge Instructions:  1. You may shower, please wash incisions daily with soap and water and keep dry.  If you wish to cover wounds with dressing you may do so but please keep clean and change daily.  No tub baths or swimming until incisions have completely healed.  If your incisions become red or develop any drainage please call our office at [phone]  2. No Driving until cleared by Dr. Lucilla Lame office and you are no longer using narcotic pain medications  3. Monitor your weight daily.. Please use the same scale and weigh at same time... If you gain 5-10 lbs in 48 hours with associated lower extremity swelling, please contact our office at [phone]  4. Fever of 101.5 for at least 24 hours with no source, please contact our office at [phone]  5. Activity- up as tolerated, please walk at least 3 times per day.  Avoid strenuous activity, no lifting, pushing, or pulling with your arms over 8-10 lbs for a minimum of 6 weeks  6. If any questions or concerns arise, please do not hesitate to contact our office at [phone]    INSULIN FOR DIABETES Go to [facility] Ask for  Novolin ReliOn 70/30 insulin pen You also need the pen needles in addition to the insulin  Take 15 units twice a day with meals (breakfast and supper) Follow up with PCP for dose adjustments   Information on my medicine - Coumadin   (Warfarin)  This medication education was reviewed with me or my healthcare representative as part of my discharge preparation.    Why was Coumadin prescribed for you? Coumadin was prescribed for you because you have a blood clot or a medical condition that can cause an increased risk of forming blood clots. Blood clots can cause serious health problems by blocking the flow of blood to the heart, lung, or brain. Coumadin can prevent harmful blood clots from forming. As a reminder your indication for Coumadin is:   mechanical AVR (aortic valve replacement)  What test will  check on my response to Coumadin? While on Coumadin (warfarin) you will need to have an INR test regularly to ensure that your dose is keeping you in the desired range. The INR (international normalized ratio) number is calculated from the result of the laboratory test called prothrombin time (PT).  If an INR APPOINTMENT HAS NOT ALREADY BEEN MADE FOR YOU please schedule an appointment to have this lab work done by your health care provider within 7 days. Your INR goal is usually a number between:  2 to 3 or your provider may give you a more narrow range like 2-2.5.  Ask your health care provider during an office visit what your goal INR is.  What  do you need to  know  About  COUMADIN? Take Coumadin (warfarin) exactly as prescribed by your healthcare provider about the same time each day.  DO NOT stop taking without talking to the doctor who prescribed the medication.  Stopping without other blood clot prevention medication to take the place of Coumadin may increase your risk of developing a new clot or stroke.  Get refills before you run out.  What do you do if you miss a dose? If you miss a dose, take it as soon as you remember on the same day then continue your regularly scheduled regimen the next day.  Do not take two doses of Coumadin at the same time.  Important Safety Information A possible side effect of Coumadin (Warfarin)  is an increased risk of bleeding. You should call your healthcare provider right away if you experience any of the following: Bleeding from an injury or your nose that does not stop. Unusual colored urine (red or dark brown) or unusual colored stools (red or black). Unusual bruising for unknown reasons. A serious fall or if you hit your head (even if there is no bleeding).  Some foods or medicines interact with Coumadin (warfarin) and might alter your response to warfarin. To help avoid this: Eat a balanced diet, maintaining a consistent amount of Vitamin K. Notify  your provider about major diet changes you plan to make. Avoid alcohol or limit your intake to 1 drink for women and 2 drinks for men per day. (1 drink is 5 oz. wine, 12 oz. beer, or 1.5 oz. liquor.)  Make sure that ANY health care provider who prescribes medication for you knows that you are taking Coumadin (warfarin).  Also make sure the healthcare provider who is monitoring your Coumadin knows when you have started a new medication including herbals and non-prescription products.  Coumadin (Warfarin)  Major Drug Interactions  Increased Warfarin Effect Decreased Warfarin Effect  Alcohol (large quantities) Antibiotics (esp. Septra/Bactrim, Flagyl, Cipro) Amiodarone (Cordarone) Aspirin (ASA) Cimetidine (Tagamet) Megestrol (Megace) NSAIDs (ibuprofen, naproxen, etc.) Piroxicam (Feldene) Propafenone (Rythmol SR) Propranolol (Inderal) Isoniazid (INH) Posaconazole (Noxafil) Barbiturates (Phenobarbital) Carbamazepine (Tegretol) Chlordiazepoxide (Librium) Cholestyramine (Questran) Griseofulvin Oral Contraceptives Rifampin Sucralfate (Carafate) Vitamin K   Coumadin (Warfarin) Major Herbal Interactions  Increased Warfarin Effect Decreased Warfarin Effect  Garlic Ginseng Ginkgo biloba Coenzyme Q10 Green tea St. John's wort    Coumadin (Warfarin) FOOD Interactions  Eat a consistent number of servings per week of foods HIGH in Vitamin K (1 serving =  cup)  Collards (cooked, or boiled & drained) Kale (cooked, or boiled & drained) Mustard greens (cooked, or boiled & drained) Parsley *serving size only =  cup Spinach (cooked, or boiled & drained) Swiss chard (cooked, or boiled & drained) Turnip greens (cooked, or boiled & drained)  Eat a consistent number of servings per week of foods MEDIUM-HIGH in Vitamin K (1 serving = 1 cup)  Asparagus (cooked, or boiled & drained) Broccoli (cooked, boiled & drained, or raw & chopped) Brussel sprouts (cooked, or boiled & drained) *serving  size only =  cup Lettuce, raw (green leaf, endive, romaine) Spinach, raw Turnip greens, raw & chopped   These websites have more information on Coumadin (warfarin):  http://www.king-russell.com/; https://www.hines.net/;

## 2022-08-20 ENCOUNTER — Other Ambulatory Visit: Payer: Self-pay

## 2022-08-20 ENCOUNTER — Inpatient Hospital Stay (HOSPITAL_COMMUNITY): Payer: Medicaid Other

## 2022-08-20 DIAGNOSIS — Z8673 Personal history of transient ischemic attack (TIA), and cerebral infarction without residual deficits: Secondary | ICD-10-CM | POA: Diagnosis not present

## 2022-08-20 DIAGNOSIS — E669 Obesity, unspecified: Secondary | ICD-10-CM | POA: Diagnosis present

## 2022-08-20 DIAGNOSIS — I350 Nonrheumatic aortic (valve) stenosis: Secondary | ICD-10-CM | POA: Diagnosis present

## 2022-08-20 DIAGNOSIS — E8770 Fluid overload, unspecified: Secondary | ICD-10-CM | POA: Diagnosis not present

## 2022-08-20 DIAGNOSIS — E1165 Type 2 diabetes mellitus with hyperglycemia: Secondary | ICD-10-CM | POA: Diagnosis present

## 2022-08-20 DIAGNOSIS — Z5900 Homelessness unspecified: Secondary | ICD-10-CM | POA: Diagnosis not present

## 2022-08-20 DIAGNOSIS — N990 Postprocedural (acute) (chronic) kidney failure: Secondary | ICD-10-CM | POA: Diagnosis not present

## 2022-08-20 DIAGNOSIS — Z8249 Family history of ischemic heart disease and other diseases of the circulatory system: Secondary | ICD-10-CM | POA: Diagnosis not present

## 2022-08-20 DIAGNOSIS — F17210 Nicotine dependence, cigarettes, uncomplicated: Secondary | ICD-10-CM | POA: Diagnosis present

## 2022-08-20 DIAGNOSIS — I2510 Atherosclerotic heart disease of native coronary artery without angina pectoris: Secondary | ICD-10-CM | POA: Diagnosis present

## 2022-08-20 DIAGNOSIS — Z952 Presence of prosthetic heart valve: Secondary | ICD-10-CM | POA: Diagnosis present

## 2022-08-20 DIAGNOSIS — K5900 Constipation, unspecified: Secondary | ICD-10-CM | POA: Diagnosis present

## 2022-08-20 DIAGNOSIS — Z6832 Body mass index (BMI) 32.0-32.9, adult: Secondary | ICD-10-CM | POA: Diagnosis not present

## 2022-08-20 DIAGNOSIS — Z7982 Long term (current) use of aspirin: Secondary | ICD-10-CM | POA: Diagnosis not present

## 2022-08-20 DIAGNOSIS — E871 Hypo-osmolality and hyponatremia: Secondary | ICD-10-CM | POA: Diagnosis present

## 2022-08-20 DIAGNOSIS — I4891 Unspecified atrial fibrillation: Secondary | ICD-10-CM | POA: Diagnosis not present

## 2022-08-20 DIAGNOSIS — D62 Acute posthemorrhagic anemia: Secondary | ICD-10-CM | POA: Diagnosis not present

## 2022-08-20 DIAGNOSIS — I440 Atrioventricular block, first degree: Secondary | ICD-10-CM | POA: Diagnosis present

## 2022-08-20 DIAGNOSIS — Z833 Family history of diabetes mellitus: Secondary | ICD-10-CM | POA: Diagnosis not present

## 2022-08-20 DIAGNOSIS — D696 Thrombocytopenia, unspecified: Secondary | ICD-10-CM | POA: Diagnosis not present

## 2022-08-20 DIAGNOSIS — I10 Essential (primary) hypertension: Secondary | ICD-10-CM | POA: Diagnosis present

## 2022-08-20 DIAGNOSIS — Z79899 Other long term (current) drug therapy: Secondary | ICD-10-CM | POA: Diagnosis not present

## 2022-08-20 DIAGNOSIS — I214 Non-ST elevation (NSTEMI) myocardial infarction: Secondary | ICD-10-CM | POA: Diagnosis present

## 2022-08-20 DIAGNOSIS — Z87442 Personal history of urinary calculi: Secondary | ICD-10-CM | POA: Diagnosis not present

## 2022-08-20 LAB — GLUCOSE, CAPILLARY
Glucose-Capillary: 168 mg/dL — ABNORMAL HIGH (ref 70–99)
Glucose-Capillary: 176 mg/dL — ABNORMAL HIGH (ref 70–99)
Glucose-Capillary: 181 mg/dL — ABNORMAL HIGH (ref 70–99)
Glucose-Capillary: 215 mg/dL — ABNORMAL HIGH (ref 70–99)

## 2022-08-20 MED ORDER — FUROSEMIDE 40 MG PO TABS
40.0000 mg | ORAL_TABLET | Freq: Every day | ORAL | Status: DC
  Administered 2022-08-20 – 2022-08-21 (×2): 40 mg via ORAL
  Filled 2022-08-20 (×2): qty 1

## 2022-08-20 MED ORDER — AMIODARONE HCL 200 MG PO TABS
400.0000 mg | ORAL_TABLET | Freq: Two times a day (BID) | ORAL | Status: DC
  Administered 2022-08-20 – 2022-08-21 (×3): 400 mg via ORAL
  Filled 2022-08-20 (×3): qty 2

## 2022-08-20 MED ORDER — POTASSIUM CHLORIDE CRYS ER 20 MEQ PO TBCR
20.0000 meq | EXTENDED_RELEASE_TABLET | Freq: Two times a day (BID) | ORAL | Status: AC
  Administered 2022-08-20 (×2): 20 meq via ORAL
  Filled 2022-08-20 (×2): qty 1

## 2022-08-20 MED ORDER — POTASSIUM CHLORIDE CRYS ER 20 MEQ PO TBCR: 20.0000 meq | EXTENDED_RELEASE_TABLET | Freq: Every day | ORAL | Status: DC

## 2022-08-20 MED ORDER — LIVING WELL WITH DIABETES BOOK
Freq: Once | Status: AC
  Filled 2022-08-20: qty 1

## 2022-08-20 MED ORDER — INSULIN GLARGINE-YFGN 100 UNIT/ML ~~LOC~~ SOLN
20.0000 [IU] | Freq: Every day | SUBCUTANEOUS | Status: DC
  Administered 2022-08-20: 20 [IU] via SUBCUTANEOUS
  Filled 2022-08-20 (×2): qty 0.2

## 2022-08-20 MED ORDER — WARFARIN SODIUM 5 MG PO TABS
7.5000 mg | ORAL_TABLET | Freq: Every day | ORAL | Status: DC
  Administered 2022-08-20: 7.5 mg via ORAL
  Filled 2022-08-20: qty 1

## 2022-08-20 MED ORDER — LOSARTAN POTASSIUM 50 MG PO TABS
50.0000 mg | ORAL_TABLET | Freq: Every day | ORAL | Status: DC
  Administered 2022-08-20 – 2022-08-21 (×2): 50 mg via ORAL
  Filled 2022-08-20 (×2): qty 1

## 2022-08-20 NOTE — Progress Notes (Addendum)
Pt in bed resting, declined ambulating at this but agreeable to ambulate after lunch. Will see then.   1355  Pt seen walking with nurse, pt initially using RW but later d/c AD to walk independently. Pt gait looks steady and stable.   Faustino Congress 08/20/2022 2:02 PM

## 2022-08-20 NOTE — Inpatient Diabetes Management (Addendum)
Inpatient Diabetes Program Recommendations  AACE/ADA: New Consensus Statement on Inpatient Glycemic Control (2015)  Target Ranges:  Prepandial:   less than 140 mg/dL      Peak postprandial:   less than 180 mg/dL (1-2 hours)      Critically ill patients:  140 - 180 mg/dL   Lab Results  Component Value Date   GLUCAP 176 (H) 08/20/2022   HGBA1C 8.7 (H) 08/13/2022    Review of Glycemic Control  Diabetes history: New DM 2 Diagnosis  Current orders for Inpatient glycemic control:  Semglee 20 units Daily Novolog 0-24 units Q6 hours  A1c 8.7% this admission  Will follow glucose trends and possibly have recommendations for tomorrow 8/2, it does look like pt would need insulin for a time for discharge for tight glucose control to promote wound healing.  Spoke with pt at bedside regarding A1c levels in the past and for current admission. Discussed current insulin needs. Explained the importance of glucose control on wound healing. Discussed potential need for insulin at time of discharge. Showed pt the insulin pen. Discussed glucose goals for discharge. Will see pt again tomorrow for education review and to give glucometer and return demonstrate insulin pen use just in case he needs it at time of d/c.   Left voicemail for shelter to inquire about places to store insulin during the day if possible.  Thanks,  Christena Deem RN, MSN, BC-ADM Inpatient Diabetes Coordinator Team Pager [phone] (8a-5p)

## 2022-08-20 NOTE — Progress Notes (Signed)
Mobility Specialist Progress Note:   08/20/22 1600  Mobility  Activity Refused mobility  Mobility Specialist Start Time (ACUTE ONLY) 1605    Pt refused mobility, no reason specified. Will f/u as able.    D'Vante Earlene Plater Mobility Specialist Please contact via Special educational needs teacher or Rehab office at [phone]

## 2022-08-20 NOTE — Progress Notes (Signed)
Patient converted to S.R. rate 75.

## 2022-08-20 NOTE — Progress Notes (Addendum)
      [address]       [place] [address]             [phone]        3 Days Post-Op Procedure(s) (LRB): AORTIC VALVE REPLACEMENT USING A 25 MM ON-X PROSTHETIC HEART VALVE (N/A) TRANSESOPHAGEAL ECHOCARDIOGRAM (N/A)  Subjective: Patient passing gas but no bowel movement yet.  Objective: Vital signs in last 24 hours: Temp:  [97.9 F (36.6 C)-99.2 F (37.3 C)] 98.5 F (36.9 C) (08/01 0335) Pulse Rate:  [68-114] 68 (08/01 0335) Cardiac Rhythm: Normal sinus rhythm (08/01 0130) Resp:  [13-26] 19 (08/01 0335) BP: (140-244)/(63-131) 160/81 (08/01 0335) SpO2:  [91 %-100 %] 100 % (08/01 0335) Weight:  [98.2 kg] 98.2 kg (08/01 0546)  Pre op weight 98.2 kg Current Weight  08/20/22 98.2 kg        Intake/Output from previous day: 07/31 0701 - 08/01 0700 In: 337.7 [P.O.:240; I.V.:97.7] Out: 3370 [Urine:3370]   Physical Exam:  Cardiovascular: RRR, no murmur Pulmonary: Clear to auscultation bilaterally Abdomen: Soft, non tender, bowel sounds present. Extremities: Mild bilateral lower extremity edema. Wound: Aquacel removed and sternal wound is clean and dry.  No erythema or signs of infection.  Lab Results: CBC: Recent Labs    08/19/22 0408 08/20/22 0231  WBC 13.8* 9.8  HGB 11.7* 11.1*  HCT 36.7* 33.7*  PLT 151 146*   BMET:  Recent Labs    08/19/22 0408 08/20/22 0231  NA 131* 134*  K 4.9 3.8  CL 99 97*  CO2 24 25  GLUCOSE 132* 219*  BUN 25* 20  CREATININE 1.67* 1.20  CALCIUM 8.7* 8.7*    PT/INR:  Lab Results  Component Value Date   INR 1.5 (H) 08/20/2022   INR 1.3 (H) 08/19/2022   INR 1.3 (H) 08/17/2022   ABG:  INR: Will add last result for INR, ABG once components are confirmed Will add last 4 CBG results once components are confirmed  Assessment/Plan: CV-Still hypertensive.He went into a fib last night. On Amiodarone drip, Lopressor 25 mg bid and Amlodipine 10 mg daily. He is in SR this am so will transition to oral  Amiodarone. Will also restart Losartan for better BP control (creatinine normal this am). On Coumadin for On X aortic valve replacement. INR this am slightly increase from 1.3 to 1.5. Will give 7.5 mg of Coumadin tonight and closely monitor INR as now on Amiodarone. Pulmonary-on 2 liters of oxygen. Wean as able. PA/LAT CXR this am appears stable-low lung volumes, cardiomegaly. Encourage incentive spirometer. CBGs 234/269/168. Pre op HGA1C 8.7. On Insulin but will increase for better glucose control . Awaiting diabetes coordinator evaluation. Will likely start Metformin in am. He will need close follow up with medical doctor after discharge. Expected post op blood loss anemia-H and H this am slightly decreased to 11.1 and 33.7. AKI-creatinine this am  decreased from 1.67 to 1.2 Supplement potassium 7. GI-LOC in am if no bowel movement today 8. Home once able to determine coumadin dose  Donielle M ZimmermanPA-C 7:02 AM  Agree with above. Afib management and anticoagulation

## 2022-08-20 NOTE — Progress Notes (Signed)
Notice patient went into At. Fib. Rate 110-120 with no complaints. Skin warm and dry.o2 sats 89-91% o2 at 2 liters n.c. applied .see flowsheet for vital signs. E.K.G. done [facility] aware. Dr. Dorris Fetch called and made aware. See orders for Amiodarone bolus and drip to be started. Started by [facility] R.N..CCM called and patient went into At.Fib. 21:45 and I was not called.

## 2022-08-21 ENCOUNTER — Other Ambulatory Visit (HOSPITAL_COMMUNITY): Payer: Self-pay

## 2022-08-21 LAB — GLUCOSE, CAPILLARY
Glucose-Capillary: 94 mg/dL (ref 70–99)
Glucose-Capillary: 162 mg/dL — ABNORMAL HIGH (ref 70–99)

## 2022-08-21 MED ORDER — FUROSEMIDE 40 MG PO TABS
40.0000 mg | ORAL_TABLET | Freq: Every day | ORAL | 0 refills | Status: DC
  Filled 2022-08-21: qty 4, 4d supply, fill #0

## 2022-08-21 MED ORDER — WARFARIN SODIUM 4 MG PO TABS
4.0000 mg | ORAL_TABLET | Freq: Every day | ORAL | 1 refills | Status: DC
  Filled 2022-08-21: qty 30, 30d supply, fill #0

## 2022-08-21 MED ORDER — AMLODIPINE BESYLATE 10 MG PO TABS
10.0000 mg | ORAL_TABLET | Freq: Every day | ORAL | 2 refills | Status: DC
  Filled 2022-08-21: qty 30, 30d supply, fill #0

## 2022-08-21 MED ORDER — CARVEDILOL 12.5 MG PO TABS
12.5000 mg | ORAL_TABLET | Freq: Two times a day (BID) | ORAL | 2 refills | Status: DC
  Filled 2022-08-21: qty 60, 30d supply, fill #0

## 2022-08-21 MED ORDER — OXYCODONE HCL 5 MG PO TABS: 5.0000 mg | ORAL_TABLET | Freq: Four times a day (QID) | ORAL | 0 refills | Status: DC | PRN

## 2022-08-21 MED ORDER — FUROSEMIDE 40 MG PO TABS: 40.0000 mg | ORAL_TABLET | Freq: Every day | ORAL | 0 refills | Status: DC

## 2022-08-21 MED ORDER — LANCET DEVICE MISC
1.0000 | Freq: Three times a day (TID) | 0 refills | Status: AC
  Filled 2022-08-21: qty 1, 30d supply, fill #0

## 2022-08-21 MED ORDER — LOSARTAN POTASSIUM 100 MG PO TABS
100.0000 mg | ORAL_TABLET | Freq: Every day | ORAL | 2 refills | Status: DC
  Filled 2022-08-21: qty 30, 30d supply, fill #0

## 2022-08-21 MED ORDER — BLOOD GLUCOSE TEST VI STRP: 1.0000 | ORAL_STRIP | Freq: Three times a day (TID) | 2 refills | Status: DC

## 2022-08-21 MED ORDER — BLOOD GLUCOSE MONITOR SYSTEM W/DEVICE KIT
1.0000 | PACK | Freq: Three times a day (TID) | 0 refills | Status: DC
  Filled 2022-08-21: qty 1, 30d supply, fill #0

## 2022-08-21 MED ORDER — WARFARIN SODIUM 4 MG PO TABS: 4.0000 mg | ORAL_TABLET | Freq: Every day | ORAL | 1 refills | Status: DC

## 2022-08-21 MED ORDER — METFORMIN HCL 500 MG PO TABS: 500.0000 mg | ORAL_TABLET | Freq: Two times a day (BID) | ORAL | 2 refills | Status: DC

## 2022-08-21 MED ORDER — ACCU-CHEK SOFTCLIX LANCETS MISC
1.0000 | Freq: Three times a day (TID) | 0 refills | Status: AC
  Filled 2022-08-21: qty 100, 30d supply, fill #0

## 2022-08-21 MED ORDER — LOSARTAN POTASSIUM 100 MG PO TABS: 100.0000 mg | ORAL_TABLET | Freq: Every day | ORAL | 2 refills | Status: DC

## 2022-08-21 MED ORDER — AMIODARONE HCL 200 MG PO TABS
ORAL_TABLET | ORAL | 1 refills | Status: DC
  Filled 2022-08-21: qty 40, 30d supply, fill #0

## 2022-08-21 MED ORDER — WARFARIN SODIUM 4 MG PO TABS: 4.0000 mg | ORAL_TABLET | Freq: Every day | ORAL | Status: DC

## 2022-08-21 MED ORDER — POTASSIUM CHLORIDE CRYS ER 20 MEQ PO TBCR: 20.0000 meq | EXTENDED_RELEASE_TABLET | Freq: Every day | ORAL | 0 refills | Status: DC

## 2022-08-21 MED ORDER — METFORMIN HCL 500 MG PO TABS
500.0000 mg | ORAL_TABLET | Freq: Two times a day (BID) | ORAL | Status: DC
  Administered 2022-08-21: 500 mg via ORAL
  Filled 2022-08-21: qty 1

## 2022-08-21 MED ORDER — AMIODARONE HCL 200 MG PO TABS: ORAL_TABLET | ORAL | 1 refills | Status: DC

## 2022-08-21 MED ORDER — METFORMIN HCL 500 MG PO TABS
500.0000 mg | ORAL_TABLET | Freq: Two times a day (BID) | ORAL | 2 refills | Status: DC
  Filled 2022-08-21: qty 60, 30d supply, fill #0

## 2022-08-21 MED ORDER — LANCET DEVICE MISC: 1.0000 | Freq: Three times a day (TID) | 0 refills | Status: DC

## 2022-08-21 MED ORDER — ATORVASTATIN CALCIUM 80 MG PO TABS
80.0000 mg | ORAL_TABLET | Freq: Every day | ORAL | 2 refills | Status: DC
  Filled 2022-08-21: qty 30, 30d supply, fill #0

## 2022-08-21 MED ORDER — CARVEDILOL 12.5 MG PO TABS: 12.5000 mg | ORAL_TABLET | Freq: Two times a day (BID) | ORAL | 2 refills | Status: DC

## 2022-08-21 MED ORDER — BLOOD GLUCOSE TEST VI STRP
1.0000 | ORAL_STRIP | Freq: Three times a day (TID) | 2 refills | Status: AC
  Filled 2022-08-21: qty 100, 34d supply, fill #0

## 2022-08-21 MED ORDER — AMLODIPINE BESYLATE 10 MG PO TABS: 10.0000 mg | ORAL_TABLET | Freq: Every day | ORAL | 2 refills | Status: DC

## 2022-08-21 MED ORDER — LANCETS MISC. MISC: 1.0000 | Freq: Three times a day (TID) | 0 refills | Status: DC

## 2022-08-21 MED ORDER — POTASSIUM CHLORIDE CRYS ER 20 MEQ PO TBCR
20.0000 meq | EXTENDED_RELEASE_TABLET | Freq: Two times a day (BID) | ORAL | Status: DC
  Administered 2022-08-21: 20 meq via ORAL
  Filled 2022-08-21: qty 1

## 2022-08-21 MED ORDER — POTASSIUM CHLORIDE CRYS ER 20 MEQ PO TBCR
20.0000 meq | EXTENDED_RELEASE_TABLET | Freq: Every day | ORAL | 0 refills | Status: DC
  Filled 2022-08-21: qty 4, 4d supply, fill #0

## 2022-08-21 MED ORDER — LOSARTAN POTASSIUM 50 MG PO TABS: 100.0000 mg | ORAL_TABLET | Freq: Every day | ORAL | Status: DC

## 2022-08-21 MED ORDER — BLOOD GLUCOSE MONITORING SUPPL DEVI: 1.0000 | Freq: Three times a day (TID) | 0 refills | Status: DC

## 2022-08-21 MED ORDER — OXYCODONE HCL 5 MG PO TABS
5.0000 mg | ORAL_TABLET | Freq: Four times a day (QID) | ORAL | 0 refills | Status: DC | PRN
  Filled 2022-08-21: qty 30, 8d supply, fill #0

## 2022-08-21 NOTE — Inpatient Diabetes Management (Signed)
Inpatient Diabetes Program Recommendations  AACE/ADA: New Consensus Statement on Inpatient Glycemic Control (2015)  Target Ranges:  Prepandial:   less than 140 mg/dL      Peak postprandial:   less than 180 mg/dL (1-2 hours)      Critically ill patients:  140 - 180 mg/dL   Lab Results  Component Value Date   GLUCAP 94 08/21/2022   HGBA1C 8.7 (H) 08/13/2022     Discharge Recommendations: Intermediate acting recommendations: insulin isophane & regular (NOVOLIN 70/30 RELION PEN) ([facility] Only) 15 units bid  Insulin pen needles Also Metformin 500 mg bid  Use Adult Diabetes Insulin Treatment Post Discharge order set.    A1c 8.7% this admission   Note: [facility] blood glucose meter kit provided to pt via [employer].  Pt is aware of how to check glucose. Reviewed glucose goals and when to call the doctor for hyper and hypoglycemia. Discussed hypoglycemia s/s and treatment.  Thanks,  Christena Deem RN, MSN, BC-ADM Inpatient Diabetes Coordinator Team Pager [phone] (8a-5p)

## 2022-08-21 NOTE — Progress Notes (Signed)
CARDIAC REHAB PHASE I   Post OHS education including site care, restrictions, risk factors, sternal precautions, IS use at home, heart healthy diabetic diet, smoking cessation, home needs at discharge and CRP2 reviewed. All questions and concerns addressed. Will refer to [facility] for CRP2. Plan for home later today.   [phone]  Woodroe Chen, RN BSN 08/21/2022 10:23 AM

## 2022-08-21 NOTE — TOC Transition Note (Signed)
Transition of Care (TOC) - CM/SW Discharge Note Donn Pierini RN, BSN Transitions of Care Unit 4E- RN Case Manager See Treatment Team for direct phone #   Patient Details  Name: Christopher Spencer MRN: [id number] Date of Birth: 04-11-1965  Transition of Care [facility]) CM/SW Contact:  Darrold Span, RN Phone Number: 08/21/2022, 3:21 PM   Clinical Narrative:    PCP f/u appointment made for 8/6 w/ [facility] in [place]- CM spoke with pt to inform and let him know that he will need to complete new pt paperwork prior to appointment either on his MyChart or arrive at 1:30 to complete at office before his 2pm appointment time- pt voiced understanding.   TOC pharmacy to assist with filling meds for discharge- pt is eligible for [facility] program ($3 copay per script) - TOC pharmacy notified and pt will be assisted with meds under MATCH program.   Pt asking for RW- however discussed with TCTS PT during AM rounds- pt mobilizing well and it is not felt pt needs RW for discharge- no DME order given for RW at this time.   Pt plans to go to sister's home for now in [place] to stay until he is able to return to [employer].   No further TOC needs noted.    Final next level of care: Home/Self Care (going to sister's home) Barriers to Discharge: Barriers Resolved   Patient Goals and CMS Choice CMS Medicare.gov Compare Post Acute Care list provided to:: Patient Choice offered to / list presented to : NA  Discharge Placement               Home          Discharge Plan and Services Additional resources added to the After Visit Summary for   In-house Referral: Clinical Social Work Discharge Planning Services: CM Consult, [facility], Follow-up appt scheduled, MATCH Program, Medication Assistance Post Acute Care Choice: NA          DME Arranged: N/A DME Agency: NA       HH Arranged: NA HH Agency: NA        Social Determinants of Health (SDOH)  Interventions SDOH Screenings   Food Insecurity: Food Insecurity Present (08/20/2022)  Housing: High Risk (08/20/2022)  Transportation Needs: Unmet Transportation Needs (08/20/2022)  Utilities: Not At Risk (08/20/2022)  Financial Resource Strain: High Risk (12/04/2017)  Physical Activity: Sufficiently Active (12/04/2017)  Social Connections: Unknown (12/04/2017)  Stress: Stress Concern Present (12/04/2017)  Tobacco Use: High Risk (08/17/2022)     Readmission Risk Interventions    08/21/2022    3:21 PM  Readmission Risk Prevention Plan  Transportation Screening Complete  Medication Review (RN Care Manager) Complete  PCP or Specialist appointment within 3-5 days of discharge Complete  HRI or Home Care Consult Complete  SW Recovery Care/Counseling Consult Complete  Palliative Care Screening Not Applicable  Skilled Nursing Facility Not Applicable

## 2022-08-21 NOTE — Progress Notes (Addendum)
      [address]       [place] [address]             [phone]        4 Days Post-Op Procedure(s) (LRB): AORTIC VALVE REPLACEMENT USING A 25 MM ON-X PROSTHETIC HEART VALVE (N/A) TRANSESOPHAGEAL ECHOCARDIOGRAM (N/A)  Subjective: Patient had 2 bowel movements. Ambulating without difficulty. He is about to eat breakfast this am.  Objective: Vital signs in last 24 hours: Temp:  [97.6 F (36.4 C)-99 F (37.2 C)] 98.7 F (37.1 C) (08/02 0337) Pulse Rate:  [65-77] 71 (08/02 0337) Cardiac Rhythm: Normal sinus rhythm (08/02 0330) Resp:  [16-20] 18 (08/02 0337) BP: (132-158)/(69-82) 158/74 (08/02 0337) SpO2:  [94 %-98 %] 95 % (08/02 0337)  Pre op weight 98.2 kg Current Weight  08/20/22 98.2 kg        Intake/Output from previous day: 08/01 0701 - 08/02 0700 In: 818.9 [P.O.:358; I.V.:460.9] Out: 2365 [Urine:2365]   Physical Exam:  Cardiovascular: RRR, no murmur Pulmonary: Clear to auscultation bilaterally Abdomen: Soft, non tender, bowel sounds present. Extremities: Trace bilateral lower extremity edema. Wound: Sternal wound is clean and dry.  No erythema or signs of infection.  Lab Results: CBC: Recent Labs    08/19/22 0408 08/20/22 0231  WBC 13.8* 9.8  HGB 11.7* 11.1*  HCT 36.7* 33.7*  PLT 151 146*   BMET:  Recent Labs    08/20/22 0231 08/21/22 0338  NA 134* 137  K 3.8 3.8  CL 97* 100  CO2 25 29  GLUCOSE 219* 103*  BUN 20 20  CREATININE 1.20 1.09  CALCIUM 8.7* 8.5*    PT/INR:  Lab Results  Component Value Date   INR 1.9 (H) 08/21/2022   INR 1.5 (H) 08/20/2022   INR 1.3 (H) 08/19/2022   ABG:  INR: Will add last result for INR, ABG once components are confirmed Will add last 4 CBG results once components are confirmed  Assessment/Plan: CV-Previous a fib. Maintaining SR, first degree heart block. On Amiodarone 400 mg bid,Coreg 12.5 mg bid, Losartan 50 mg daily, and Amlodipine 10 mg daily. On Coumadin for On X aortic  valve replacement. INR this am increased from 1.5 to 1.9. Will continue with 4 mg of Coumadin. Pulmonary-on room air. Encourage incentive spirometer. CBGs 215/181/94. Pre op HGA1C 8.7. On Insulin. Would prefer to start oral agent as newly diabetic. Will likely start Metformin. He will need close follow up with medical doctor after discharge. Expected post op blood loss anemia-H and H this am slightly decreased to 11.1 and 33.7. AKI resolved-creatinine this am  decreased from 1.2 to 1.09.  Mild volume overload-Will not need Lasix at discharge. Supplement potassium 8. Will discuss with surgeon;possibly to sister's house today (he is homeless and cannot return to shelter at this time because they cannot help him much post op)  Donielle M ZimmermanPA-C 6:55 AM  Agree with above Ok to dc to sisters home

## 2022-08-21 NOTE — Inpatient Diabetes Management (Signed)
Inpatient Diabetes Program Recommendations  AACE/ADA: New Consensus Statement on Inpatient Glycemic Control (2015)  Target Ranges:  Prepandial:   less than 140 mg/dL      Peak postprandial:   less than 180 mg/dL (1-2 hours)      Critically ill patients:  140 - 180 mg/dL   Lab Results  Component Value Date   GLUCAP 94 08/21/2022   HGBA1C 8.7 (H) 08/13/2022    Review of Glycemic Control  Diabetes history: New DM 2 Diagnosis  Current orders for Inpatient glycemic control:  Semglee 20 units Daily Novolog 0-24 units Q6 hours  A1c 8.7% this admission  Even though A1c is at 8.7%, It does look like pt would need insulin for a time for discharge for tight glucose control to promote wound healing.  -   Novolin ReliOn 70/30 15 units bid from [facility] ($25/vial, $43 insulin pen box of 5 ct) -   Needs clinic follow up for PCP and management of Diabetes  Will give meter from [facility] to pt, will see for insulin administration review.  Thanks,  Christena Deem RN, MSN, BC-ADM Inpatient Diabetes Coordinator Team Pager [phone] (8a-5p)

## 2022-08-21 NOTE — Progress Notes (Signed)
AVS given and reviewed with pt. TOC meds delivered to bedside. Medications discussed. All questions answered to satisfaction. Pt verbalized understanding of information given, including checking blood sugar. Pt to be escorted off the unit with all belongings via wheelchair by staff member.

## 2022-08-25 ENCOUNTER — Encounter: Payer: Self-pay | Admitting: Nurse Practitioner

## 2022-08-25 ENCOUNTER — Other Ambulatory Visit: Payer: Self-pay

## 2022-08-25 ENCOUNTER — Ambulatory Visit (INDEPENDENT_AMBULATORY_CARE_PROVIDER_SITE_OTHER): Payer: Medicaid Other | Admitting: Nurse Practitioner

## 2022-08-25 VITALS — BP 118/76 | HR 86 | Temp 98.1°F | Ht 68.0 in | Wt 205.0 lb

## 2022-08-25 DIAGNOSIS — Z952 Presence of prosthetic heart valve: Secondary | ICD-10-CM | POA: Diagnosis not present

## 2022-08-25 DIAGNOSIS — Z7984 Long term (current) use of oral hypoglycemic drugs: Secondary | ICD-10-CM

## 2022-08-25 DIAGNOSIS — E1165 Type 2 diabetes mellitus with hyperglycemia: Secondary | ICD-10-CM

## 2022-08-25 DIAGNOSIS — A539 Syphilis, unspecified: Secondary | ICD-10-CM

## 2022-08-25 DIAGNOSIS — I10 Essential (primary) hypertension: Secondary | ICD-10-CM | POA: Diagnosis not present

## 2022-08-25 NOTE — Progress Notes (Signed)
Bethanie Dicker, NP-C Phone: [phone]  Christopher Spencer is a 57 y.o. male who presents today to establish care. He is followed closely by Cardiology. He had an aortic valve replacement on 08/17/2022. Notes and labs reviewed. He has been doing well since his procedure. He has had some chest soreness. Denies chest pain. Denies shortness of breath.   HYPERTENSION Disease Monitoring Home BP Monitoring- Not checking Chest pain- No    Dyspnea- No Medications Compliance-  Norvasc, Coreg, Losartan. Lightheadedness-  No  Edema- No BMET    Component Value Date/Time   NA 137 08/21/2022 0338   K 3.8 08/21/2022 0338   CL 100 08/21/2022 0338   CO2 29 08/21/2022 0338   GLUCOSE 103 (H) 08/21/2022 0338   BUN 20 08/21/2022 0338   CREATININE 1.09 08/21/2022 0338   CALCIUM 8.5 (L) 08/21/2022 0338   GFRNONAA >60 08/21/2022 0338   GFRAA >60 02/17/2018 1606   DIABETES Disease Monitoring: Blood Sugar ranges- Not checking Polyuria/phagia/dipsia- Polydipsia      Optho- No Medications: Compliance- Metformin Hypoglycemic symptoms- No Lab Results  Component Value Date   HGBA1C 8.7 (H) 08/13/2022   Patient treated for Syphilis on 12/15/2021 with Bicillin 2.4 million units x 1 dose. He was symptomatic at the time and had a reactive RPR test with a titer of 1:128. He was advised to have follow up blood work after 6 months, which has not been .   Active Ambulatory Problems    Diagnosis Date Noted   Seizure (HCC) 12/04/2017   Chest pain 05/22/2021   Cocaine abuse (HCC) 05/22/2021   Essential hypertension 05/22/2021   Hypokalemia 05/22/2021   Does not have primary care provider 05/22/2021   Non-ST elevation (NSTEMI) myocardial infarction (HCC) 06/11/2022   Severe aortic valve stenosis 06/11/2022   Ventricular hypertrophy 06/11/2022   Hypertensive heart disease with heart failure (HCC) 06/11/2022   Pulmonary hypertension, unspecified (HCC) 06/11/2022   S/P AVR 08/17/2022   Type 2 diabetes mellitus with  hyperglycemia, without long-term current use of insulin (HCC) 08/25/2022   Syphilis 08/25/2022   Long term (current) use of anticoagulants 08/26/2022   Resolved Ambulatory Problems    Diagnosis Date Noted   No Resolved Ambulatory Problems   Past Medical History:  Diagnosis Date   Aortic stenosis    Arthritis    Cerebellar stroke (HCC)    Elevated hemoglobin A1c    Heart murmur    History of kidney stones    Hypertension    Polysubstance abuse (HCC)    Tobacco abuse     Family History  Problem Relation Age of Onset   Diabetes Father    Heart disease Sister     Social History   Socioeconomic History   Marital status: Single    Spouse name: Not on file   Number of children: Not on file   Years of education: Not on file   Highest education level: Not on file  Occupational History   Not on file  Tobacco Use   Smoking status: Every Day    Current packs/day: 0.50    Average packs/day: 0.5 packs/day for 10.0 years (5.0 ttl pk-yrs)    Types: Cigarettes   Smokeless tobacco: Never  Vaping Use   Vaping status: Never Used  Substance and Sexual Activity   Alcohol use: No   Drug use: Yes    Types: Marijuana, Cocaine    Comment: none for 5-6 months as of 07/24/2022   Sexual activity: Yes    Birth  control/protection: Condom  Other Topics Concern   Not on file  Social History Narrative   Not on file   Social Determinants of Health   Financial Resource Strain: High Risk (12/04/2017)   Overall Financial Resource Strain (CARDIA)    Difficulty of Paying Living Expenses: Very hard  Food Insecurity: Food Insecurity Present (08/20/2022)   Hunger Vital Sign    Worried About Programme researcher, broadcasting/film/video in the Last Year: Often true    Ran Out of Food in the Last Year: Often true  Transportation Needs: Unmet Transportation Needs (08/20/2022)   PRAPARE - Administrator, Civil Service (Medical): Yes    Lack of Transportation (Non-Medical): Yes  Physical Activity: Sufficiently  Active (12/04/2017)   Exercise Vital Sign    Days of Exercise per Week: 6 days    Minutes of Exercise per Session: 150+ min  Stress: Stress Concern Present (12/04/2017)   [employer] of Occupational Health - Occupational Stress Questionnaire    Feeling of Stress : Rather much  Social Connections: Unknown (12/04/2017)   Social Connection and Isolation Panel [NHANES]    Frequency of Communication with Friends and Family: Patient declined    Frequency of Social Gatherings with Friends and Family: Patient declined    Attends Religious Services: Patient declined    Database administrator or Organizations: Patient declined    Attends Banker Meetings: Patient declined    Marital Status: Patient declined  Intimate Partner Violence: Not At Risk (08/20/2022)   Humiliation, Afraid, Rape, and Kick questionnaire    Fear of Current or Ex-Partner: No    Emotionally Abused: No    Physically Abused: No    Sexually Abused: No    ROS  General:  Negative for unexplained weight loss, fever Skin: Negative for new or changing mole, sore that won't heal HEENT: Negative for trouble hearing, trouble seeing, ringing in ears, mouth sores, hoarseness, change in voice, dysphagia. CV:  Negative for chest pain, dyspnea, edema, palpitations Resp: Negative for cough, dyspnea, hemoptysis GI: Negative for vomiting, diarrhea, constipation, abdominal pain, melena, hematochezia. GU: Negative for dysuria, incontinence, urinary hesitance, hematuria, vaginal or penile discharge, polyuria, sexual difficulty, lumps in testicle or breasts MSK: Negative for muscle cramps or aches, joint pain or swelling Neuro: Negative for headaches, weakness, numbness, dizziness, passing out/fainting Psych: Negative for depression, anxiety, memory problems  Objective  Physical Exam Vitals:   08/25/22 1400  BP: 118/76  Pulse: 86  Temp: 98.1 F (36.7 C)  SpO2: 96%    BP Readings from Last 3 Encounters:  08/25/22  118/76  08/21/22 (!) 178/91  08/13/22 138/73   Wt Readings from Last 3 Encounters:  08/25/22 205 lb (93 kg)  08/21/22 212 lb 4.8 oz (96.3 kg)  08/13/22 210 lb 9.6 oz (95.5 kg)    Physical Exam Constitutional:      General: He is not in acute distress.    Appearance: Normal appearance.  HENT:     Head: Normocephalic.  Cardiovascular:     Rate and Rhythm: Normal rate and regular rhythm.     Heart sounds: Normal heart sounds.  Pulmonary:     Effort: Pulmonary effort is normal.     Breath sounds: Normal breath sounds.  Chest:       Comments: Sternal wound noted on exam- healing well. No signs of infection present.  Skin:    General: Skin is warm and dry.  Neurological:     General: No focal deficit present.  Mental Status: He is alert.  Psychiatric:        Mood and Affect: Mood normal.        Behavior: Behavior normal.    Assessment/Plan:   Essential hypertension Assessment & Plan: Chronic. Stable on Norvasc, Coreg and Losartan daily. Continue.    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 Doing well today. He will follow up with Cardiology and Cardiothoracic surgery as scheduled. He was referred for Cardiac rehab at discharge. Continue current medication regimen.     Type 2 diabetes mellitus with hyperglycemia, without long-term current use of insulin (HCC) Assessment & Plan: New diagnosis, noted during pre-op work up. Recently started on Metformin 500 mg BID. He will continue the medication as prescribed. Diabetic education provided to patient. Will plan to recheck A1c at next appointment. Encouraged healthy diet.    Syphilis Assessment & Plan: Completed treatment on 12/15/2021. Never had follow up lab work for 6 month test of cure. Will check RPR today. Denies any symptoms. Will plan to check again at 12 months and 2 years.   Orders: -     RPR   Return in about 3 months (around 11/25/2022) for Follow up.   Bethanie Dicker, NP-C [facility]

## 2022-08-25 NOTE — Telephone Encounter (Signed)
Patient contacted regarding discharge from [facility] on 08/21/22.  Patient understands to follow up with provider Cadence Furth PA on 09/11/22 at 3:35 pm at [facility]. Patient understands discharge instructions? Yes Patient understands medications and regiment? Yes Patient understands to bring all medications to this visit? Yes

## 2022-08-26 ENCOUNTER — Ambulatory Visit: Payer: Medicaid Other | Attending: Cardiology

## 2022-08-26 ENCOUNTER — Ambulatory Visit: Payer: Self-pay

## 2022-08-26 ENCOUNTER — Encounter: Payer: Self-pay | Admitting: Nurse Practitioner

## 2022-08-26 DIAGNOSIS — Z7901 Long term (current) use of anticoagulants: Secondary | ICD-10-CM | POA: Insufficient documentation

## 2022-08-26 DIAGNOSIS — Z952 Presence of prosthetic heart valve: Secondary | ICD-10-CM

## 2022-08-26 NOTE — Assessment & Plan Note (Addendum)
Completed treatment on 12/15/2021. Never had follow up lab work for 6 month test of cure. Will check RPR today. Denies any symptoms. Will plan to check again at 12 months and 2 years.

## 2022-08-26 NOTE — Patient Instructions (Signed)
Take 1 tablet Daily.  INR in 1 week.  A full discussion of the nature of anticoagulants has been carried out.  A benefit risk analysis has been presented to the patient, so that they understand the justification for choosing anticoagulation at this time. The need for frequent and regular monitoring, precise dosage adjustment and compliance is stressed.  Carmino effects of potential bleeding are discussed.  The patient should avoid any OTC items containing aspirin or ibuprofen, and should avoid great swings in general diet.  Avoid alcohol consumption.  Call if any signs of abnormal bleeding.  [phone].

## 2022-08-26 NOTE — Assessment & Plan Note (Addendum)
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 Doing well today. He will follow up with Cardiology and Cardiothoracic surgery as scheduled. He was referred for Cardiac rehab at discharge. Continue current medication regimen.

## 2022-08-26 NOTE — Assessment & Plan Note (Signed)
Chronic. Stable on Norvasc, Coreg and Losartan daily. Continue.

## 2022-08-26 NOTE — Assessment & Plan Note (Signed)
New diagnosis, noted during pre-op work up. Recently started on Metformin 500 mg BID. He will continue the medication as prescribed. Diabetic education provided to patient. Will plan to recheck A1c at next appointment. Encouraged healthy diet.

## 2022-08-27 NOTE — Progress Notes (Signed)
     [address]       [place] [address]             [phone]       Patient: Home Provider: Office Consent for Telemedicine visit obtained.  Today's visit was completed via a real-time telehealth (see specific modality noted below). The patient/authorized person provided oral consent at the time of the visit to engage in a telemedicine encounter with the present provider at [facility]. The patient/authorized person was informed of the potential benefits, limitations, and risks of telemedicine. The patient/authorized person expressed understanding that the laws that protect confidentiality also apply to telemedicine. The patient/authorized person acknowledged understanding that telemedicine does not provide emergency services and that he or she would need to call 911 or proceed to the nearest hospital for help if such a need arose.   Total time spent in the clinical discussion 10 minutes.  Telehealth Modality: Phone visit (audio only)  I had a telephone visit with  Christopher Spencer who is s/p AVR.  Overall doing well.  Pain is minimal.  Ambulating well. Vitals have been stable.  Christopher Spencer will see [place] back in 1 month with a chest x-ray for cardiac rehab clearance.  Christopher Spencer

## 2022-08-28 ENCOUNTER — Ambulatory Visit (INDEPENDENT_AMBULATORY_CARE_PROVIDER_SITE_OTHER): Payer: Self-pay | Admitting: Thoracic Surgery (Cardiothoracic Vascular Surgery)

## 2022-08-28 ENCOUNTER — Other Ambulatory Visit: Payer: Self-pay | Admitting: *Deleted

## 2022-08-28 DIAGNOSIS — Z952 Presence of prosthetic heart valve: Secondary | ICD-10-CM

## 2022-08-28 DIAGNOSIS — I214 Non-ST elevation (NSTEMI) myocardial infarction: Secondary | ICD-10-CM

## 2022-08-28 DIAGNOSIS — I350 Nonrheumatic aortic (valve) stenosis: Secondary | ICD-10-CM

## 2022-09-02 ENCOUNTER — Ambulatory Visit: Payer: Medicaid Other | Attending: Thoracic Surgery (Cardiothoracic Vascular Surgery)

## 2022-09-02 DIAGNOSIS — Z952 Presence of prosthetic heart valve: Secondary | ICD-10-CM

## 2022-09-02 DIAGNOSIS — Z7901 Long term (current) use of anticoagulants: Secondary | ICD-10-CM

## 2022-09-02 LAB — POCT INR: INR: 2.4 (ref 2.0–3.0)

## 2022-09-02 NOTE — Patient Instructions (Signed)
Take 1 tablet Daily.  INR in 1 week.   [phone]

## 2022-09-08 ENCOUNTER — Telehealth: Payer: Self-pay | Admitting: Nurse Practitioner

## 2022-09-08 ENCOUNTER — Encounter: Payer: Self-pay | Admitting: Intensive Care

## 2022-09-08 ENCOUNTER — Other Ambulatory Visit: Payer: Self-pay

## 2022-09-08 ENCOUNTER — Emergency Department
Admission: EM | Admit: 2022-09-08 | Discharge: 2022-09-08 | Discharge status: Against Medical Advice | Payer: Medicaid Other | Attending: Student in an Organized Health Care Education/Training Program | Admitting: Student in an Organized Health Care Education/Training Program

## 2022-09-08 ENCOUNTER — Emergency Department: Payer: Medicaid Other

## 2022-09-08 ENCOUNTER — Telehealth: Payer: Self-pay | Admitting: Medical

## 2022-09-08 DIAGNOSIS — R531 Weakness: Secondary | ICD-10-CM | POA: Insufficient documentation

## 2022-09-08 DIAGNOSIS — R197 Diarrhea, unspecified: Secondary | ICD-10-CM | POA: Insufficient documentation

## 2022-09-08 DIAGNOSIS — Z5321 Procedure and treatment not carried out due to patient leaving prior to being seen by health care provider: Secondary | ICD-10-CM | POA: Insufficient documentation

## 2022-09-08 DIAGNOSIS — J9811 Atelectasis: Secondary | ICD-10-CM | POA: Diagnosis not present

## 2022-09-08 LAB — CBC
HCT: 41.2 % (ref 39.0–52.0)
Hemoglobin: 13.7 g/dL (ref 13.0–17.0)
MCH: 29.6 pg (ref 26.0–34.0)
MCHC: 33.3 g/dL (ref 30.0–36.0)
MCV: 89 fL (ref 80.0–100.0)
Platelets: 383 10*3/uL (ref 150–400)
RBC: 4.63 MIL/uL (ref 4.22–5.81)
RDW: 12.8 % (ref 11.5–15.5)
WBC: 6.7 10*3/uL (ref 4.0–10.5)
nRBC: 0 % (ref 0.0–0.2)

## 2022-09-08 LAB — BASIC METABOLIC PANEL
Anion gap: 10 (ref 5–15)
BUN: 21 mg/dL — ABNORMAL HIGH (ref 6–20)
CO2: 21 mmol/L — ABNORMAL LOW (ref 22–32)
Calcium: 9.2 mg/dL (ref 8.9–10.3)
Chloride: 104 mmol/L (ref 98–111)
Creatinine, Ser: 1.03 mg/dL (ref 0.61–1.24)
GFR, Estimated: >60 mL/min (ref >60)
Glucose, Bld: 153 mg/dL — ABNORMAL HIGH (ref 70–99)
Potassium: 4.1 mmol/L (ref 3.5–5.1)
Sodium: 135 mmol/L (ref 135–145)

## 2022-09-08 LAB — HEPATIC FUNCTION PANEL
ALT: 53 U/L — ABNORMAL HIGH (ref 0–44)
AST: 30 U/L (ref 15–41)
Albumin: 3.8 g/dL (ref 3.5–5.0)
Alkaline Phosphatase: 107 U/L (ref 38–126)
Bilirubin, Direct: 0.1 mg/dL (ref 0.0–0.2)
Indirect Bilirubin: 0.6 mg/dL (ref 0.3–0.9)
Total Bilirubin: 0.7 mg/dL (ref 0.3–1.2)
Total Protein: 7.2 g/dL (ref 6.5–8.1)

## 2022-09-08 LAB — TROPONIN I (HIGH SENSITIVITY): Troponin I (High Sensitivity): 18 ng/L — ABNORMAL HIGH (ref <18)

## 2022-09-08 NOTE — Telephone Encounter (Signed)
Patient's sister called and said that patient is not eating anything and is taking him to the ER. Would like for someone to give her a call back to explain patient's symptoms

## 2022-09-08 NOTE — ED Triage Notes (Signed)
Patient c/o weakness with diarrhea and nausea X5 days. Reports little po intake. States he had heart valve surgery X3 weeks ago.

## 2022-09-08 NOTE — Telephone Encounter (Signed)
Spoke to patient's sister and she stated that the patients said he is having diarrhea, nauseous, and hasn't had much of an appetite for about 5 days.She did not know if he was running a fever or had chills, only that he had not eaten much and is complaining of abdominal pain and diarrhea. Patient thinks it could be a new medication and refused to be seen. Instructed sister to have patient present to nearest [facility] or ED for assessment and evaluation as he recently had aortic valve surgery and he can be effected negatively with the dehydration he is experiencing. The patient's sister understood with read back

## 2022-09-08 NOTE — Telephone Encounter (Signed)
Patients brother called and said he is having diarrhea, and nauseas, and not having a appetite for 5 days. Could someone call him tomorrow to see what he can do. His number is [phone]

## 2022-09-09 NOTE — Telephone Encounter (Signed)
Pt was seen at the ED on yesterday in regards to this

## 2022-09-11 ENCOUNTER — Encounter: Payer: Self-pay | Admitting: Medical

## 2022-09-11 ENCOUNTER — Telehealth: Payer: Self-pay

## 2022-09-11 ENCOUNTER — Ambulatory Visit: Payer: Medicaid Other

## 2022-09-11 ENCOUNTER — Ambulatory Visit: Payer: Medicaid Other | Attending: Medical | Admitting: Medical

## 2022-09-11 VITALS — BP 146/87 | HR 66 | Ht 69.0 in | Wt 207.4 lb

## 2022-09-11 DIAGNOSIS — I214 Non-ST elevation (NSTEMI) myocardial infarction: Secondary | ICD-10-CM

## 2022-09-11 DIAGNOSIS — Z7901 Long term (current) use of anticoagulants: Secondary | ICD-10-CM

## 2022-09-11 DIAGNOSIS — I350 Nonrheumatic aortic (valve) stenosis: Secondary | ICD-10-CM | POA: Diagnosis not present

## 2022-09-11 DIAGNOSIS — Z952 Presence of prosthetic heart valve: Secondary | ICD-10-CM | POA: Diagnosis not present

## 2022-09-11 DIAGNOSIS — I480 Paroxysmal atrial fibrillation: Secondary | ICD-10-CM | POA: Diagnosis not present

## 2022-09-11 NOTE — Patient Instructions (Signed)
Medication Instructions:  Your physician recommends that you continue on your current medications as directed. Please refer to the Current Medication list given to you today.  *If you need a refill on your cardiac medications before your next appointment, please call your pharmacy*   Lab Work: -None ordered  Testing/Procedures: Heart Monitor:  Your physician has requested you wear a ZIO XT patch heart monitor for 14 days.  Your monitor will be mailed to your home address within 3-5 business days. This is sent via [employer] from [employer]. However, if you have not received your monitor after 5 business days please send [place] a MyChart message or call the office at [phone], so we may follow up on this for you.   This monitor is a medical device (single patch monitor) that records the heart's electrical activity. Doctors most often use these monitors to diagnose arrhythmias. Arrhythmias are problems with the speed or rhythm of the heartbeat.   [employer] supplies 1 patch per enrollment. Additional stickers are not available.  Please DO NOT apply the patch if you will be having a Nuclear Stress Test, Echocardiogram, Cardiac CT, Cardiac MRI, Chest X-ray during the period you would be wearing the monitor. The patch cannot be worn during these tests.  You cannot remove and re-apply the ZIO patch monitor.   Applying the Monitor: Once you receive your monitor, this will include a small razor, abrader, and 4 alcohol pads. Shave hair from upper left chest Rub abrader disc in 40 strokes over the left upper chest as indicated in your monitor instructions Clean area with 4 enclosed alcohol pads (there may be a mild & brief stinging sensation over the newly abraded area, but this is normal). Let dry Apply patch as indicated in monitor instructions. Patch will be placed under collarbone on the left side of the chest with arrow pointing upward. Rub adhesive wings for 2 minutes. Remove white label  marked "1". Remove the white label marked "2". Rub patch adhesive wings for an 2 minutes.  While looking in a mirror, press and release button in the center of the patch. You may hear a "click". A small green light will flash 4-6 times and then stop. This will be your indicator that the monitor has been turned on.  Wearing the Monitor: Avoid showering during the first 24 hours of wearing the monitor.  After 24 hours you may shower with the patch on. Take brief showers with your back facing the shower head.  Avoid excessive sweating to help maximize wear time. Do not submerge the device, no hot tubs, and no swimming pools. Keep any lotions or oils away from the patch. Press the button if you feel a symptom. You will hear a small click. Record date, time, and symptoms in the Patient Logbook or App.  Monitor Issues: Call [employer] Customer Care at [phone] if you have questions regarding your Zio Patch Monitor. Call them immediately if you see an orange/ amber colored light blinking on your monitor. If your monitor falls off and you cannot get this reapplied or if you need suggestions for securing your monitor call [employer] at [phone].   Returning the Monitor: Once you have completed wearing your monitor, follow instructions on the last 2 pages of the Patient Logbook. Stick monitor patch on to the last page of the Patient Logbook.  Place Patient Logbook with monitor in the return box provided. Use locking tab on box and tape box closed securely. The return box  has Careers information officer on it.  Place the return box in the regular [place] Mail box as soon as possible It will take anywhere from 1-2 weeks for your provider to receive and review your results once you mail this back. If for some reason you have misplaced your return box then call our office and we can provide another box and/or mail it off for you.   Billing  and Patient Assistance Program Information: We have supplied  [employer] with any of your insurance information on file for billing purposes. [employer] offers a sliding scale Patient Assistance Program for patients that do not have insurance, or whose insurance does not completely cover the cost of the ZIO monitor. You must apply for the Patient Assistance Program to qualify for this discounted rate. To apply, please call [employer] at [phone], select option 1, ask to apply for the Patient Assistance Program. [employer] will ask your household income, and how many people are in your household. They will quote your out-of-pocket cost based on that information. [employer] will also be able to set up for a 25-month, interest-free payment plan if needed.      Follow-Up: At [facility], you and your health needs are our priority.  As part of our continuing mission to provide you with exceptional heart care, we have created designated Provider Care Teams.  These Care Teams include your primary Cardiologist (physician) and Advanced Practice Providers (APPs -  Physician Assistants and Nurse Practitioners) who all work together to provide you with the care you need, when you need it.  Your next appointment:   2 month(s)  Provider:   Cadence Fransico Michael, PA-C    Other Instructions - YouTube search zio patch heart monitor, video is 3:46

## 2022-09-11 NOTE — Transitions of Care (Post Inpatient/ED Visit) (Unsigned)
I spoke with pt; 09/08/22 pt had diarrhea and weakness;pt thought was a med given him for heart that causing problems; pt not at home and does not know name of med but pt stopped med on his own 2 days ago and now feels OK. pt LWBS [facility] ED on 09/08/22. card appt today with Cadence Furth PA; pt will call [facility] if appt needed with PCP. Sending note to Jacqualin Combes NP.     09/11/2022  Name: KIRAN RAWLES MRN: [id number] DOB: [birth year]  Today's TOC FU Call Status: Today's TOC FU Call Status:: Successful TOC FU Call Completed TOC FU Call Complete Date: 09/11/22 Patient's Name and Date of Birth confirmed.  Transition Care Management Follow-up Telephone Call Date of Discharge: 09/08/22 Discharge Facility: [facility]) Type of Discharge: Emergency Department Reason for ED Visit: Other: (09/08/22 pt had diarrhea and weakness;pt thought was a med given him for heart that causing problems; pt not at home and does not know name of med but pt stopped med on his own 2 days ago and now feels OK. pt LWBS  [facility] ED on 09/08/22. card appt today) How have you been since you were released from the hospital?: Better Any questions or concerns?: No  Items Reviewed: Did you receive and understand the discharge instructions provided?: No (pt LWBS at [facility] ED) Medications obtained,verified, and reconciled?: No Medications Not Reviewed Reasons:: Advised Patient to Call Provider Office (pt has appt with card 09/11/22 and will discuss meds then pt is not at home where his meds are; pt not know names of meds.) Any new allergies since your discharge?: No Dietary orders reviewed?: NA  Medications Reviewed Today: Medications Reviewed Today   Medications were not reviewed in this encounter     Home Care and Equipment/Supplies: Were Home Health Services Ordered?: NA Any new equipment or medical supplies ordered?: NA  Functional Questionnaire: Do you need assistance with  bathing/showering or dressing?: No Do you need assistance with meal preparation?: No Do you need assistance with eating?: No Do you have difficulty maintaining continence: No Do you need assistance with getting out of bed/getting out of a chair/moving?: No Do you have difficulty managing or taking your medications?: No  Follow up appointments reviewed: PCP Follow-up appointment confirmed?: NA Specialist Hospital Follow-up appointment confirmed?: Yes Date of Specialist follow-up appointment?: 09/11/22 Follow-Up Specialty Provider:: Cadence Furth PA Do you need transportation to your follow-up appointment?: No Do you understand care options if your condition(s) worsen?: Yes-patient verbalized understanding    SIGNATURE Lewanda Rife, LPN

## 2022-09-11 NOTE — Progress Notes (Unsigned)
Cardiology Office Note:    Date:  09/11/2022   ID:  Christopher Spencer, DOB Oct 31, 1965, MRN [id number]  PCP:  Bethanie Dicker, NP  [facility] Cardiologist:  Julien Nordmann, MD  [facility] Electrophysiologist:  None   Referring MD: No ref. provider found   Chief Complaint: Hospital follow-up  History of Present Illness:    Christopher Spencer is a 57 y.o. male with a hx of with no significant coronary disease and severe aortic stenosis s/p AVR who presents for follow-up.   The patient was admitted in 05/2022 with NSTEMI and hypertensive urgency. Cath did not show significant CAD, but severe Aortic stenosis. Echo showed aortic valve area of 0.95cm2 with a mean gradient 41.57mmHg. Cath showed aortic valve area of 0.77 with a mean gradient of . Follow-up with Dr. Cliffton Asters on June 7th.   The patient presented to the hospital 08/17/22 for aortic valve replacement. Post-op he was started on warfarin with INR goal 2-2.5 for the first 3 months then 1.5 afterward. He went into afib RVR and was started on IV amiodarone with quick conversion to NSR. He was sent home with amiodarone 200mg  daily.   Today, the patient reports he was feeling nauseas for about 5 days after the surgery. He thinks this was due to overmedicating. He is now feeling better. He denies chest pain. Breathing is still a little short, although this is getting better every day. No lower leg edema. Patient is still smoking 3 cigarettes daily. Post-op, he is not driving for 4 weeks and will back at work after 6 weeks.   Past Medical History:  Diagnosis Date   Aortic stenosis    a. 11/2017 Echo: EF 60-65%. No rwma, Gr2 DD, mod AS (AVA 1.12 cm^2), mild AI/MR.   Arthritis    Cerebellar stroke (HCC)    a. 11/2017 in setting of drug abuse.   Elevated hemoglobin A1c    8.7% 08/13/22   Heart murmur    History of kidney stones    Hypertension    Polysubstance abuse (HCC)    Syphilis    Tobacco abuse     Past Surgical History:   Procedure Laterality Date   AORTIC VALVE REPLACEMENT N/A 08/17/2022   Procedure: AORTIC VALVE REPLACEMENT USING A 25 MM ON-X PROSTHETIC HEART VALVE;  Surgeon: [name], MD;  Location: [facility] OR;  Service: Open Heart Surgery;  Laterality: N/A;   CHOLECYSTECTOMY     RIGHT/LEFT HEART CATH AND CORONARY ANGIOGRAPHY N/A 06/12/2022   Procedure: RIGHT/LEFT HEART CATH AND CORONARY ANGIOGRAPHY;  Surgeon: Iran Ouch, MD;  Location: [facility] INVASIVE CV LAB;  Service: Cardiovascular;  Laterality: N/A;   TEE WITHOUT CARDIOVERSION N/A 08/17/2022   Procedure: TRANSESOPHAGEAL ECHOCARDIOGRAM;  Surgeon: [name], MD;  Location: [facility] OR;  Service: Open Heart Surgery;  Laterality: N/A;    Current Medications: Current Meds  Medication Sig   amiodarone (PACERONE) 200 MG tablet Take 200 mg by mouth daily.   amLODipine (NORVASC) 10 MG tablet Take 1 tablet (10 mg total) by mouth daily.   atorvastatin (LIPITOR) 80 MG tablet Take 1 tablet (80 mg total) by mouth daily.   carvedilol (COREG) 12.5 MG tablet Take 1 tablet (12.5 mg total) by mouth 2 (two) times daily with a meal.   losartan (COZAAR) 100 MG tablet Take 1 tablet (100 mg total) by mouth daily.   metFORMIN (GLUCOPHAGE) 500 MG tablet Take 1 tablet (500 mg total) by mouth 2 (two) times daily with a meal.  warfarin (COUMADIN) 4 MG tablet Take 1 tablet (4 mg total) by mouth daily at 4 PM. Or as directed     Allergies:   Patient has no known allergies.   Social History   Socioeconomic History   Marital status: Single    Spouse name: Not on file   Number of children: Not on file   Years of education: Not on file   Highest education level: Not on file  Occupational History   Not on file  Tobacco Use   Smoking status: Every Day    Current packs/day: 0.50    Average packs/day: 0.5 packs/day for 10.0 years (5.0 ttl pk-yrs)    Types: Cigarettes   Smokeless tobacco: Never  Vaping Use   Vaping status: Never Used  Substance and Sexual  Activity   Alcohol use: No   Drug use: Not Currently    Types: Marijuana, Cocaine    Comment: none for 5-6 months as of 07/24/2022   Sexual activity: Yes    Birth control/protection: Condom  Other Topics Concern   Not on file  Social History Narrative   Not on file   Social Determinants of Health   Financial Resource Strain: High Risk (12/04/2017)   Overall Financial Resource Strain (CARDIA)    Difficulty of Paying Living Expenses: Very hard  Food Insecurity: Food Insecurity Present (08/20/2022)   Hunger Vital Sign    Worried About Running Out of Food in the Last Year: Often true    Ran Out of Food in the Last Year: Often true  Transportation Needs: Unmet Transportation Needs (08/20/2022)   PRAPARE - Administrator, Civil Service (Medical): Yes    Lack of Transportation (Non-Medical): Yes  Physical Activity: Sufficiently Active (12/04/2017)   Exercise Vital Sign    Days of Exercise per Week: 6 days    Minutes of Exercise per Session: 150+ min  Stress: Stress Concern Present (12/04/2017)   [employer] of Occupational Health - Occupational Stress Questionnaire    Feeling of Stress : Rather much  Social Connections: Unknown (12/04/2017)   Social Connection and Isolation Panel [NHANES]    Frequency of Communication with Friends and Family: Patient declined    Frequency of Social Gatherings with Friends and Family: Patient declined    Attends Religious Services: Patient declined    Database administrator or Organizations: Patient declined    Attends Banker Meetings: Patient declined    Marital Status: Patient declined     Family History: The patient's family history includes Diabetes in his father; Heart disease in his sister.  ROS:   Please see the history of present illness.     All other systems reviewed and are negative.  EKGs/Labs/Other Studies Reviewed:    The following studies were reviewed today:  Intraoperative TEE  07/2022  Complications: No known complications during this procedure.         POST-OP IMPRESSIONS  s/p AVR with 25mm On-X heart valve  _ Left Ventricle: The left ventricle is unchanged from pre-bypass.  _ Right Ventricle: The right ventricle appears unchanged from pre-bypass.  _ Aorta: No dissection noted after cannula removed.  _ Left Atrium: The left atrium appears unchanged from pre-bypass.  _ Left Atrial Appendage: The left atrial appendage appears unchanged from  pre-bypass.  _ Aortic Valve: s/p 25mm On-X heart valve, no abnormal rocking or leaking  noted.  Mean gradient 4-8 mmHg.  _ Mitral Valve: The mitral valve appears unchanged from pre-bypass.  _ Tricuspid Valve: The tricuspid valve appears unchanged from pre-bypass.  _ Pulmonic Valve: The pulmonic valve appears unchanged from pre-bypass.  _ Interatrial Septum: The interatrial septum appears unchanged from  pre-bypass.  _ Interventricular Septum: The interventricular septum appears unchanged  from  pre-bypass.  _ Pericardium: The pericardium appears unchanged from pre-bypass.   R/L heart cath 05/2022     The left ventricular systolic function is normal.   LV end diastolic pressure is normal.   The left ventricular ejection fraction is 55-65% by visual estimate.   1.  Minimal irregularities with no evidence of obstructive coronary artery disease. 2.  Normal LV systolic function. 3.  Severe aortic stenosis with mean gradient of 56 mmHg and valve area of 0.77. 4.  Right heart catheterization showed mildly elevated filling pressures, mild pulmonary hypertension and normal cardiac output.   RA: 9 mmHg RV: 38/5 mmHg PW: 16 mmHg with normal waveforms PA: 36/17 with a mean of 22 mmHg Cardiac output is 5.67 with a cardiac index of 2.74.   Recommendations: Suspect that the patient has bicuspid aortic valve given valve morphology on echo and his relatively young age.  Recommend evaluation for aortic valve replacement.   Echo  05/2022 1. Left ventricular ejection fraction, by estimation, is 60 to 65%. The  left ventricle has normal function. The left ventricle has no regional  wall motion abnormalities. There is moderate left ventricular hypertrophy.  Left ventricular diastolic  parameters are consistent with Grade II diastolic dysfunction  (pseudonormalization).   2. Right ventricular systolic function is normal. The right ventricular  size is normal. There is moderately elevated pulmonary artery systolic  pressure. The estimated right ventricular systolic pressure is 54.8 mmHg.   3. Left atrial size was moderately dilated.   4. The mitral valve is normal in structure. Moderate mitral valve  regurgitation. No evidence of mitral stenosis.   5. Tricuspid valve regurgitation is moderate.   6. The aortic valve is normal in structure. There is severe calcifcation  of the aortic valve. Aortic valve regurgitation is mild. Severe aortic  valve stenosis. Aortic valve area, by VTI measures 0.95 cm. Aortic valve  mean gradient measures 41.6 mmHg.  Aortic valve Vmax measures 4.18 m/s.   7. The inferior vena cava is normal in size with greater than 50%  respiratory variability, suggesting right atrial pressure of 3 mmHg.   EKG:  EKG is  ordered today.  The ekg ordered today demonstrates NSR 66bpm, TWI lateral leads  Recent Labs: 08/18/2022: Magnesium 2.4 09/08/2022: ALT 53; BUN 21; Creatinine, Ser 1.03; Hemoglobin 13.7; Platelets 383; Potassium 4.1; Sodium 135  Recent Lipid Panel    Component Value Date/Time   CHOL 163 06/10/2022 1224   TRIG 95 06/10/2022 1224   HDL 50 06/10/2022 1224   CHOLHDL 3.3 06/10/2022 1224   VLDL 19 06/10/2022 1224   LDLCALC 94 06/10/2022 1224    Physical Exam:    VS:  BP (!) 146/87 (BP Location: Left Arm, Patient Position: Sitting, Cuff Size: Normal)   Pulse 66   Ht 5\' 9"  (1.753 m)   Wt 207 lb 6 oz (94.1 kg)   SpO2 98%   BMI 30.62 kg/m     Wt Readings from Last 3 Encounters:   09/11/22 207 lb 6 oz (94.1 kg)  09/08/22 210 lb (95.3 kg)  08/25/22 205 lb (93 kg)     GEN:  Well nourished, well developed in no acute distress HEENT: Normal NECK: No JVD; No carotid bruits  LYMPHATICS: No lymphadenopathy CARDIAC: RRR, + murmur, rubs, gallops RESPIRATORY:  Clear to auscultation without rales, wheezing or rhonchi  ABDOMEN: Soft, non-tender, non-distended MUSCULOSKELETAL:  No edema; No deformity  SKIN: Warm and dry NEUROLOGIC:  Alert and oriented x 3 PSYCHIATRIC:  Normal affect   ASSESSMENT:    1. Paroxysmal A-fib (HCC)   2. S/P AVR   3. H/O aortic valve replacement   4. Long term (current) use of anticoagulants   5. Severe aortic stenosis   6. Non-ST elevation (NSTEMI) myocardial infarction [facility])    PLAN:    In order of problems listed above:  Post-op Afib Patient had post-op afib and quickly converted with IV amiodarone. He is on amiodarone 200mg  daily. I will order a 2 week heart monitor to assess for recurrent Afib. If there is no recurrence of afib, may be able to stop amiodarone after post-op recovery period.   Severe Aortic stenosis s/p replacement INR goal 2-2.5 for the first 3 months then 1.5 afterwards. He is following with INR clinic here in the office. He has follow-up with CTS next month. Surgical site looks good. It appears follow-up echo has been ordered.   NSTEMI Patient denies chest pain or shortness of breath. LHC in 05/2022 showed no obstructive CAD. Continue Aspirin. No Plavix given warfarin. Continue Lipitor 80mg  daily.   HTN BP is mildly elevated today, he did just smoke. I will leave medications as they are. Continue Losartan 100mg  daily, coreg 12.5mg  BID, Amlodipine 10mg  daily. We will re-visit this at follow-up.   HLD LDL 91. Continue Lipitor 80mg  daily. We can re-check lipids at follow-up.  Disposition: Follow up in 2 month(s) with MD/APP    Signed, India Jolin David Stall, PA-C  09/11/2022 4:18 PM    [facility]  HeartCare

## 2022-09-16 ENCOUNTER — Ambulatory Visit: Payer: Medicaid Other | Attending: Thoracic Surgery (Cardiothoracic Vascular Surgery)

## 2022-09-16 ENCOUNTER — Other Ambulatory Visit: Payer: Self-pay

## 2022-09-16 DIAGNOSIS — Z952 Presence of prosthetic heart valve: Secondary | ICD-10-CM

## 2022-09-16 DIAGNOSIS — Z7901 Long term (current) use of anticoagulants: Secondary | ICD-10-CM

## 2022-09-16 LAB — POCT INR: INR: 1.7 — AB (ref 2.0–3.0)

## 2022-09-16 MED ORDER — WARFARIN SODIUM 4 MG PO TABS: 4.0000 mg | ORAL_TABLET | Freq: Every day | ORAL | 1 refills | Status: DC

## 2022-09-16 NOTE — Patient Instructions (Signed)
Take 2 tablets tonight only then continue 1 tablet Daily.  INR in 2 weeks.   [phone]

## 2022-09-17 ENCOUNTER — Other Ambulatory Visit (HOSPITAL_COMMUNITY): Payer: Self-pay

## 2022-09-20 DIAGNOSIS — Z419 Encounter for procedure for purposes other than remedying health state, unspecified: Secondary | ICD-10-CM | POA: Diagnosis not present

## 2022-09-22 ENCOUNTER — Other Ambulatory Visit: Payer: Self-pay | Admitting: Physician Assistant

## 2022-09-22 DIAGNOSIS — Z952 Presence of prosthetic heart valve: Secondary | ICD-10-CM

## 2022-09-24 ENCOUNTER — Other Ambulatory Visit: Payer: Self-pay | Admitting: Thoracic Surgery (Cardiothoracic Vascular Surgery)

## 2022-09-24 DIAGNOSIS — I350 Nonrheumatic aortic (valve) stenosis: Secondary | ICD-10-CM

## 2022-09-25 ENCOUNTER — Ambulatory Visit (INDEPENDENT_AMBULATORY_CARE_PROVIDER_SITE_OTHER): Payer: Self-pay | Admitting: Thoracic Surgery (Cardiothoracic Vascular Surgery)

## 2022-09-25 ENCOUNTER — Other Ambulatory Visit: Payer: Self-pay

## 2022-09-25 ENCOUNTER — Ambulatory Visit
Admission: RE | Admit: 2022-09-25 | Discharge: 2022-09-25 | Disposition: A | Discharge status: Home / Self Care | Payer: Medicaid Other | Source: Ambulatory Visit | Attending: Thoracic Surgery (Cardiothoracic Vascular Surgery) | Admitting: Thoracic Surgery (Cardiothoracic Vascular Surgery)

## 2022-09-25 VITALS — BP 156/96 | HR 81 | Resp 18 | Ht 69.0 in | Wt 212.0 lb

## 2022-09-25 DIAGNOSIS — Z9889 Other specified postprocedural states: Secondary | ICD-10-CM | POA: Diagnosis not present

## 2022-09-25 DIAGNOSIS — I350 Nonrheumatic aortic (valve) stenosis: Secondary | ICD-10-CM

## 2022-09-25 NOTE — Progress Notes (Signed)
Referral placed to [facility] at [facility] per Dr. Lucilla Lame request.

## 2022-09-25 NOTE — Progress Notes (Signed)
      [address]       [place] [address]             [phone]        Christopher Spencer [facility] Medical Record #[id number] Date of Birth: 01/01/1966  Referring: Antonieta Iba, MD Primary Care: Bethanie Dicker, NP Primary Cardiologist:Timothy Mariah Milling, MD  Reason for visit:   follow-up  History of Present Illness:     57 year old male status post mechanical aortic valve replacement presents for his 1 month follow-up appointment.  Overall he is doing well.  Physical Exam: BP (!) 156/96 (BP Location: Right Arm, Patient Position: Sitting)   Pulse 81   Resp 18   Ht 5\' 9"  (1.753 m)   Wt 212 lb (96.2 kg)   SpO2 97% Comment: RA  BMI 31.31 kg/m   Alert NAD Incision clean.  Sternum stable Abdomen, ND No peripheral edema   Diagnostic Studies & Laboratory data: CXR: Clear     Assessment / Plan:   57 year old male status post mechanical aortic valve replacement for severe aortic stenosis.  Overall he is doing well.  He is cleared for cardiac rehab to return to work.  He will follow-up with cardiology.   [name] 09/25/2022 10:31 AM

## 2022-09-27 DIAGNOSIS — Z952 Presence of prosthetic heart valve: Secondary | ICD-10-CM | POA: Diagnosis not present

## 2022-09-27 DIAGNOSIS — I214 Non-ST elevation (NSTEMI) myocardial infarction: Secondary | ICD-10-CM

## 2022-09-27 DIAGNOSIS — Z7901 Long term (current) use of anticoagulants: Secondary | ICD-10-CM | POA: Diagnosis not present

## 2022-09-30 ENCOUNTER — Ambulatory Visit: Payer: Medicaid Other | Attending: Thoracic Surgery (Cardiothoracic Vascular Surgery)

## 2022-09-30 DIAGNOSIS — Z952 Presence of prosthetic heart valve: Secondary | ICD-10-CM

## 2022-09-30 DIAGNOSIS — Z7901 Long term (current) use of anticoagulants: Secondary | ICD-10-CM

## 2022-09-30 LAB — POCT INR: INR: 1.5 — AB (ref 2.0–3.0)

## 2022-09-30 NOTE — Patient Instructions (Signed)
START TAKING 1 TABLET DAILY, EXCEPT 1.5 TABLETS EVERY Monday, Wednesday and Friday.  INR in 2 weeks.  Call Bethanie Dicker, NP [phone] Glucose machine   [phone]

## 2022-10-05 ENCOUNTER — Ambulatory Visit: Payer: Medicaid Other

## 2022-10-14 ENCOUNTER — Ambulatory Visit: Payer: Medicaid Other | Attending: Thoracic Surgery (Cardiothoracic Vascular Surgery)

## 2022-10-14 DIAGNOSIS — Z952 Presence of prosthetic heart valve: Secondary | ICD-10-CM | POA: Diagnosis not present

## 2022-10-14 DIAGNOSIS — Z7901 Long term (current) use of anticoagulants: Secondary | ICD-10-CM

## 2022-10-14 LAB — POCT INR: INR: 2.1 (ref 2.0–3.0)

## 2022-10-14 NOTE — Patient Instructions (Signed)
CONTINUE TAKING 1 TABLET DAILY, EXCEPT 1.5 TABLETS EVERY Monday, Wednesday and Friday.  INR in 4 weeks.  Call Bethanie Dicker, NP [phone] Glucose machine   [phone]

## 2022-10-22 ENCOUNTER — Ambulatory Visit: Payer: Medicaid Other | Attending: Physician Assistant

## 2022-11-11 ENCOUNTER — Ambulatory Visit: Payer: Medicaid Other

## 2022-11-17 ENCOUNTER — Ambulatory Visit: Payer: Medicaid Other | Attending: Medical | Admitting: Medical

## 2022-11-17 ENCOUNTER — Encounter: Payer: Self-pay | Admitting: Medical

## 2022-11-17 VITALS — BP 138/74 | HR 71 | Ht 69.0 in | Wt 206.0 lb

## 2022-11-17 DIAGNOSIS — Z952 Presence of prosthetic heart valve: Secondary | ICD-10-CM

## 2022-11-17 DIAGNOSIS — I214 Non-ST elevation (NSTEMI) myocardial infarction: Secondary | ICD-10-CM | POA: Diagnosis not present

## 2022-11-17 DIAGNOSIS — I350 Nonrheumatic aortic (valve) stenosis: Secondary | ICD-10-CM | POA: Diagnosis not present

## 2022-11-17 DIAGNOSIS — I10 Essential (primary) hypertension: Secondary | ICD-10-CM

## 2022-11-17 DIAGNOSIS — I480 Paroxysmal atrial fibrillation: Secondary | ICD-10-CM | POA: Diagnosis not present

## 2022-11-17 DIAGNOSIS — E782 Mixed hyperlipidemia: Secondary | ICD-10-CM

## 2022-11-17 MED ORDER — CARVEDILOL 25 MG PO TABS: 25.0000 mg | ORAL_TABLET | Freq: Two times a day (BID) | ORAL | 4 refills | Status: DC

## 2022-11-17 NOTE — Patient Instructions (Addendum)
Medication Instructions:   INCREASE Carvedilol 25 mg 1 tablet by mouth twice daily.  *If you need a refill on your cardiac medications before your next appointment, please call your pharmacy*   Lab Work:  Your provider would like for you to have the following labs drawn: TODAY.   LIPID DIRECT LDL  Please go to [facility] [address] [address], [place] [address] You do not need an appointment.  They are open from 7:30 am-4 pm.  Lunch from 1:00 pm- 2:00 pm +   If you have labs (blood work) drawn today and your tests are completely normal, you will receive your results only by: MyChart Message (if you have MyChart) OR A paper copy in the mail If you have any lab test that is abnormal or we need to change your treatment, we will call you to review the results.   Testing/Procedures:  Please schedule pending order for Echo.   Follow-Up: At [facility], you and your health needs are our priority.  As part of our continuing mission to provide you with exceptional heart care, we have created designated Provider Care Teams.  These Care Teams include your primary Cardiologist (physician) and Advanced Practice Providers (APPs -  Physician Assistants and Nurse Practitioners) who all work together to provide you with the care you need, when you need it.  We recommend signing up for the patient portal called "MyChart".  Sign up information is provided on this After Visit Summary.  MyChart is used to connect with patients for Virtual Visits (Telemedicine).  Patients are able to view lab/test results, encounter notes, upcoming appointments, etc.  Non-urgent messages can be sent to your provider as well.   To learn more about what you can do with MyChart, go to [email or web address].    Your next appointment:   4 month(s)  Provider:   Julien Nordmann, MD    Other Instructions  Please contact Cardiac Rehab to schedule appointment:  [phone]

## 2022-11-17 NOTE — Progress Notes (Unsigned)
Cardiology Office Note:    Date:  11/17/2022   ID:  Christopher Spencer, DOB July 02, 1965, MRN [id number]  PCP:  Bethanie Dicker, NP  [facility] Cardiologist:  Julien Nordmann, MD  [facility] Electrophysiologist:  None   Referring MD: Bethanie Dicker, NP   Chief Complaint: 2 month follow-up  History of Present Illness:    Christopher Spencer is a 57 y.o. male with a hx of no significant coronary disease and severe aortic stenosis s/p AVR who presents for follow-up.    The patient was admitted in 05/2022 with NSTEMI and hypertensive urgency. Cath did not show significant CAD, but severe Aortic stenosis. Echo showed aortic valve area of 0.95cm2 with a mean gradient 41.81mmHg. Cath showed aortic valve area of 0.77 with a mean gradient of . Follow-up with Dr. Cliffton Asters on June 7th.    The patient presented to the hospital 08/17/22 for aortic valve replacement. Post-op he was started on warfarin with INR goal 2-2.5 for the first 3 months then 1.5 afterward. He went into afib RVR and was started on IV amiodarone with quick conversion to NSR. He was sent home with amiodarone 200mg  daily.   Patient was last seen in August 2024 and is overall stable from a cardiac perspective.  He had minimal shortness of breath. He was in NSR. Heart monitor was ordered to assess recurrent A-fib.  Heart monitor showed normal sinus rhythm, 1 run of VT, 5 runs of SVT.  Repeat echo has been ordered.  Today, the patient is doing well from a cardiac perspective. He denies chest pain. Has occasional SOB on exertion. He is back at work. He works at [employer] and it can get hot at times. He denies lower leg edema, lightheadedness or dizziness. He missed the repeat echo appointment. He is still smoking. He does not check BP at home. He was referred to cardiac rehab, but says he did not get a call.    Past Medical History:  Diagnosis Date   Aortic stenosis    a. 11/2017 Echo: EF 60-65%. No rwma, Gr2 DD, mod AS (AVA 1.12 cm^2),  mild AI/MR.   Arthritis    Cerebellar stroke (HCC)    a. 11/2017 in setting of drug abuse.   Elevated hemoglobin A1c    8.7% 08/13/22   Heart murmur    History of kidney stones    Hypertension    Polysubstance abuse (HCC)    Syphilis    Tobacco abuse     Past Surgical History:  Procedure Laterality Date   AORTIC VALVE REPLACEMENT N/A 08/17/2022   Procedure: AORTIC VALVE REPLACEMENT USING A 25 MM ON-X PROSTHETIC HEART VALVE;  Surgeon: [name], MD;  [facility]: [facility];  Service: Open Heart Surgery;  Laterality: N/A;   CHOLECYSTECTOMY     RIGHT/LEFT HEART CATH AND CORONARY ANGIOGRAPHY N/A 06/12/2022   Procedure: RIGHT/LEFT HEART CATH AND CORONARY ANGIOGRAPHY;  Surgeon: Iran Ouch, MD;  Location: [facility] INVASIVE CV LAB;  Service: Cardiovascular;  Laterality: N/A;   TEE WITHOUT CARDIOVERSION N/A 08/17/2022   Procedure: TRANSESOPHAGEAL ECHOCARDIOGRAM;  Surgeon: [name], MD;  Location: [facility] OR;  Service: Open Heart Surgery;  Laterality: N/A;    Current Medications: Current Meds  Medication Sig   amiodarone (PACERONE) 200 MG tablet Take 200 mg by mouth daily.   amLODipine (NORVASC) 10 MG tablet Take 1 tablet (10 mg total) by mouth daily.   atorvastatin (LIPITOR) 80 MG tablet Take 1 tablet (80 mg total) by  mouth daily.   Blood Glucose Monitoring Suppl (BLOOD GLUCOSE MONITOR SYSTEM) w/Device KIT Use 1 in the morning, at noon, and at bedtime.   Glucose Blood (BLOOD GLUCOSE TEST STRIPS) STRP Use 1 strip in the morning, at noon, and at bedtime. May substitute to any manufacturer covered by patient's insurance.   losartan (COZAAR) 100 MG tablet Take 1 tablet (100 mg total) by mouth daily.   metFORMIN (GLUCOPHAGE) 500 MG tablet Take 1 tablet (500 mg total) by mouth 2 (two) times daily with a meal.   warfarin (COUMADIN) 4 MG tablet Take 1 tablet (4 mg total) by mouth daily at 4 PM. Or as directed   [DISCONTINUED] carvedilol (COREG) 12.5 MG tablet Take 1 tablet (12.5 mg  total) by mouth 2 (two) times daily with a meal.     Allergies:   Patient has no known allergies.   Social History   Socioeconomic History   Marital status: Single    Spouse name: Not on file   Number of children: Not on file   Years of education: Not on file   Highest education level: Not on file  Occupational History   Not on file  Tobacco Use   Smoking status: Every Day    Current packs/day: 0.50    Average packs/day: 0.5 packs/day for 10.0 years (5.0 ttl pk-yrs)    Types: Cigarettes   Smokeless tobacco: Never  Vaping Use   Vaping status: Never Used  Substance and Sexual Activity   Alcohol use: No   Drug use: Not Currently    Types: Marijuana, Cocaine    Comment: none for 5-6 months as of 07/24/2022   Sexual activity: Yes    Birth control/protection: Condom  Other Topics Concern   Not on file  Social History Narrative   Not on file   Social Determinants of Health   Financial Resource Strain: High Risk (12/04/2017)   Overall Financial Resource Strain (CARDIA)    Difficulty of Paying Living Expenses: Very hard  Food Insecurity: Food Insecurity Present (08/20/2022)   Hunger Vital Sign    Worried About Running Out of Food in the Last Year: Often true    Ran Out of Food in the Last Year: Often true  Transportation Needs: Unmet Transportation Needs (08/20/2022)   PRAPARE - Administrator, Civil Service (Medical): Yes    Lack of Transportation (Non-Medical): Yes  Physical Activity: Sufficiently Active (12/04/2017)   Exercise Vital Sign    Days of Exercise per Week: 6 days    Minutes of Exercise per Session: 150+ min  Stress: Stress Concern Present (12/04/2017)   [employer] of Occupational Health - Occupational Stress Questionnaire    Feeling of Stress : Rather much  Social Connections: Unknown (12/04/2017)   Social Connection and Isolation Panel [NHANES]    Frequency of Communication with Friends and Family: Patient declined    Frequency of Social  Gatherings with Friends and Family: Patient declined    Attends Religious Services: Patient declined    Database administrator or Organizations: Patient declined    Attends Banker Meetings: Patient declined    Marital Status: Patient declined     Family History: The patient's family history includes Diabetes in his father; Heart disease in his sister.  ROS:   Please see the history of present illness.     All other systems reviewed and are negative.  EKGs/Labs/Other Studies Reviewed:    The following studies were reviewed today:  Intraoperative TEE  07/2022  Complications: No known complications during this procedure.         POST-OP IMPRESSIONS  s/p AVR with 25mm On-X heart valve  _ Left Ventricle: The left ventricle is unchanged from pre-bypass.  _ Right Ventricle: The right ventricle appears unchanged from pre-bypass.  _ Aorta: No dissection noted after cannula removed.  _ Left Atrium: The left atrium appears unchanged from pre-bypass.  _ Left Atrial Appendage: The left atrial appendage appears unchanged from  pre-bypass.  _ Aortic Valve: s/p 25mm On-X heart valve, no abnormal rocking or leaking  noted.  Mean gradient 4-8 mmHg.  _ Mitral Valve: The mitral valve appears unchanged from pre-bypass.  _ Tricuspid Valve: The tricuspid valve appears unchanged from pre-bypass.  _ Pulmonic Valve: The pulmonic valve appears unchanged from pre-bypass.  _ Interatrial Septum: The interatrial septum appears unchanged from  pre-bypass.  _ Interventricular Septum: The interventricular septum appears unchanged  from  pre-bypass.  _ Pericardium: The pericardium appears unchanged from pre-bypass.    R/L heart cath 05/2022      The left ventricular systolic function is normal.   LV end diastolic pressure is normal.   The left ventricular ejection fraction is 55-65% by visual estimate.   1.  Minimal irregularities with no evidence of obstructive coronary artery disease. 2.   Normal LV systolic function. 3.  Severe aortic stenosis with mean gradient of 56 mmHg and valve area of 0.77. 4.  Right heart catheterization showed mildly elevated filling pressures, mild pulmonary hypertension and normal cardiac output.   RA: 9 mmHg RV: 38/5 mmHg PW: 16 mmHg with normal waveforms PA: 36/17 with a mean of 22 mmHg Cardiac output is 5.67 with a cardiac index of 2.74.   Recommendations: Suspect that the patient has bicuspid aortic valve given valve morphology on echo and his relatively young age.  Recommend evaluation for aortic valve replacement.   Echo 05/2022 1. Left ventricular ejection fraction, by estimation, is 60 to 65%. The  left ventricle has normal function. The left ventricle has no regional  wall motion abnormalities. There is moderate left ventricular hypertrophy.  Left ventricular diastolic  parameters are consistent with Grade II diastolic dysfunction  (pseudonormalization).   2. Right ventricular systolic function is normal. The right ventricular  size is normal. There is moderately elevated pulmonary artery systolic  pressure. The estimated right ventricular systolic pressure is 54.8 mmHg.   3. Left atrial size was moderately dilated.   4. The mitral valve is normal in structure. Moderate mitral valve  regurgitation. No evidence of mitral stenosis.   5. Tricuspid valve regurgitation is moderate.   6. The aortic valve is normal in structure. There is severe calcifcation  of the aortic valve. Aortic valve regurgitation is mild. Severe aortic  valve stenosis. Aortic valve area, by VTI measures 0.95 cm. Aortic valve  mean gradient measures 41.6 mmHg.  Aortic valve Vmax measures 4.18 m/s.   7. The inferior vena cava is normal in size with greater than 50%  respiratory variability, suggesting right atrial pressure of 3 mmHg.   EKG:  EKG is  ordered today.  The ekg ordered today demonstrates NST 71bpm, TWI inf/lat leads, LVH with repol  Recent  Labs: 08/18/2022: Magnesium 2.4 09/08/2022: ALT 53; BUN 21; Creatinine, Ser 1.03; Hemoglobin 13.7; Platelets 383; Potassium 4.1; Sodium 135  Recent Lipid Panel    Component Value Date/Time   CHOL 163 06/10/2022 1224   TRIG 95 06/10/2022 1224   HDL 50 06/10/2022 1224  CHOLHDL 3.3 06/10/2022 1224   VLDL 19 06/10/2022 1224   LDLCALC 94 06/10/2022 1224     Physical Exam:    VS:  BP 138/74 (BP Location: Left Arm, Patient Position: Sitting, Cuff Size: Normal)   Pulse 71   Ht 5\' 9"  (1.753 m)   Wt 206 lb (93.4 kg)   SpO2 98%   BMI 30.42 kg/m     Wt Readings from Last 3 Encounters:  11/17/22 206 lb (93.4 kg)  09/25/22 212 lb (96.2 kg)  09/11/22 207 lb 6 oz (94.1 kg)     GEN:  Well nourished, well developed in no acute distress HEENT: Normal NECK: No JVD; No carotid bruits LYMPHATICS: No lymphadenopathy CARDIAC: RRR, + murmur, no rubs, gallops RESPIRATORY:  Clear to auscultation without rales, wheezing or rhonchi  ABDOMEN: Soft, non-tender, non-distended MUSCULOSKELETAL:  No edema; No deformity  SKIN: Warm and dry NEUROLOGIC:  Alert and oriented x 3 PSYCHIATRIC:  Normal affect   ASSESSMENT:    1. Severe aortic stenosis   2. S/P AVR   3. Paroxysmal A-fib (HCC)   4. Non-ST elevation (NSTEMI) myocardial infarction (HCC)   5. Essential hypertension   6. Hyperlipidemia, mixed    PLAN:    In order of problems listed above:  Severe AS s/p AVR Patient saw Dr. Cliffton Asters and was overall stable.  Patient missed repeat echo appointment, we will reschedule this.  Patient is on warfarin followed by INR clinic.  INR goal 1.5. He was referred to cardia rehab, but did not receive a call, we will re-send the referral.  Postop A-fib Patient had postop A-fib and quickly converted with IV amiodarone and he was transitioned to oral amiodarone.  Heart monitor showed no recurrent A-fib or flutter.  We will continue amiodarone in the postop period.  Can possibly stop this at  follow-up.  Non-STEMI Left heart cath showed nonobstructive CAD.  Patient denies chest pain.  He has minimal shortness of breath on exertion.  No aspirin given Coumadin.  Continue Lipitor and Coreg.  HTN Blood pressure elevated today.  I will increase Coreg to 25 mg twice daily.  Continue amlodipine 10 mg daily and losartan 100 mg daily.  HLD LDL 90.  Repeat lipids today.  Continue Lipitor 80 mg daily.  Disposition: Follow up in 4 month(s) with MD   Signed, Zyah Gomm David Stall, PA-C  11/17/2022 4:40 PM    [facility]

## 2022-11-18 LAB — LDL CHOLESTEROL, DIRECT: LDL Direct: 28 mg/dL (ref 0–99)

## 2022-11-18 LAB — LIPID PANEL
Chol/HDL Ratio: 2.8 ratio (ref 0.0–5.0)
Cholesterol, Total: 75 mg/dL — ABNORMAL LOW (ref 100–199)
HDL: 27 mg/dL — ABNORMAL LOW (ref >39)
LDL Chol Calc (NIH): 28 mg/dL (ref 0–99)
Triglycerides: 101 mg/dL (ref 0–149)
VLDL Cholesterol Cal: 20 mg/dL (ref 5–40)

## 2022-11-20 DIAGNOSIS — Z419 Encounter for procedure for purposes other than remedying health state, unspecified: Secondary | ICD-10-CM | POA: Diagnosis not present

## 2022-11-25 ENCOUNTER — Ambulatory Visit: Payer: Medicaid Other | Admitting: Nurse Practitioner

## 2022-11-25 ENCOUNTER — Ambulatory Visit: Payer: Medicaid Other

## 2022-11-25 NOTE — Progress Notes (Deleted)
  Bethanie Dicker, NP-C Phone: [phone]  Christopher Spencer is a 57 y.o. male who presents today for follow up.   ***  Social History   Tobacco Use  Smoking Status Every Day   Current packs/day: 0.50   Average packs/day: 0.5 packs/day for 10.0 years (5.0 ttl pk-yrs)   Types: Cigarettes  Smokeless Tobacco Never    Current Outpatient Medications on File Prior to Visit  Medication Sig Dispense Refill   amiodarone (PACERONE) 200 MG tablet Take 200 mg by mouth daily.     amLODipine (NORVASC) 10 MG tablet Take 1 tablet (10 mg total) by mouth daily. 30 tablet 2   atorvastatin (LIPITOR) 80 MG tablet Take 1 tablet (80 mg total) by mouth daily. 30 tablet 2   Blood Glucose Monitoring Suppl (BLOOD GLUCOSE MONITOR SYSTEM) w/Device KIT Use 1 in the morning, at noon, and at bedtime. 1 kit 0   carvedilol (COREG) 25 MG tablet Take 1 tablet (25 mg total) by mouth 2 (two) times daily with a meal. 60 tablet 4   Glucose Blood (BLOOD GLUCOSE TEST STRIPS) STRP Use 1 strip in the morning, at noon, and at bedtime. May substitute to any manufacturer covered by patient's insurance. 100 strip 2   losartan (COZAAR) 100 MG tablet Take 1 tablet (100 mg total) by mouth daily. 30 tablet 2   metFORMIN (GLUCOPHAGE) 500 MG tablet Take 1 tablet (500 mg total) by mouth 2 (two) times daily with a meal. 60 tablet 2   oxyCODONE (OXY IR/ROXICODONE) 5 MG immediate release tablet Take 1 tablet (5 mg total) by mouth every 6 (six) hours as needed for severe pain. (Patient not taking: Reported on 11/17/2022) 30 tablet 0   warfarin (COUMADIN) 4 MG tablet Take 1 tablet (4 mg total) by mouth daily at 4 PM. Or as directed 30 tablet 1   No current facility-administered medications on file prior to visit.     ROS see history of present illness  Objective  Physical Exam There were no vitals filed for this visit.  BP Readings from Last 3 Encounters:  11/17/22 138/74  09/25/22 (!) 156/96  09/11/22 (!) 146/87   Wt Readings from  Last 3 Encounters:  11/17/22 206 lb (93.4 kg)  09/25/22 212 lb (96.2 kg)  09/11/22 207 lb 6 oz (94.1 kg)    Physical Exam   Assessment/Plan: Please see individual problem list.  There are no diagnoses linked to this encounter.   Health Maintenance: ***  No follow-ups on file.   Bethanie Dicker, NP-C [facility]

## 2022-12-02 ENCOUNTER — Ambulatory Visit: Payer: Medicaid Other

## 2022-12-08 ENCOUNTER — Ambulatory Visit: Payer: Medicaid Other | Attending: Physician Assistant

## 2022-12-12 ENCOUNTER — Other Ambulatory Visit: Payer: Self-pay | Admitting: Cardiovascular Disease

## 2022-12-12 ENCOUNTER — Other Ambulatory Visit: Payer: Self-pay | Admitting: Physician Assistant

## 2022-12-16 ENCOUNTER — Other Ambulatory Visit: Payer: Self-pay | Admitting: Cardiovascular Disease

## 2022-12-16 NOTE — Telephone Encounter (Signed)
*  STAT* If patient is at the pharmacy, call can be transferred to refill team.   1. Which medications need to be refilled? (please list name of each medication and dose if known)   amiodarone (PACERONE) 200 MG tablet  amLODipine (NORVASC) 10 MG tablet  atorvastatin (LIPITOR) 80 MG tablet  carvedilol (COREG) 25 MG tablet  losartan (COZAAR) 100 MG tablet  warfarin (COUMADIN) 4 MG tablet  metFORMIN (GLUCOPHAGE) 500 MG tablet   2. Would you like to learn more about the convenience, safety, & potential cost savings by using the [facility]?   3. Are you open to using the [facility] (Type [facility]. ).  4. Which pharmacy/location (including street and city if local pharmacy) is medication to be sent to?  [id number] - [place] ([address]), [place] - [address]    5. Do they need a 30 day or 90 day supply?   90 day  Patient stated he is completely out of these medications.   Patient has appointment scheduled on 03/22/23.

## 2022-12-18 ENCOUNTER — Other Ambulatory Visit: Payer: Self-pay | Admitting: Physician Assistant

## 2022-12-20 DIAGNOSIS — Z419 Encounter for procedure for purposes other than remedying health state, unspecified: Secondary | ICD-10-CM | POA: Diagnosis not present

## 2022-12-21 ENCOUNTER — Other Ambulatory Visit: Payer: Self-pay

## 2022-12-21 MED ORDER — ATORVASTATIN CALCIUM 80 MG PO TABS: 80.0000 mg | ORAL_TABLET | Freq: Every day | ORAL | 3 refills | Status: DC

## 2022-12-21 MED ORDER — AMIODARONE HCL 200 MG PO TABS: 200.0000 mg | ORAL_TABLET | Freq: Every day | ORAL | 3 refills | Status: DC

## 2022-12-21 MED ORDER — AMLODIPINE BESYLATE 10 MG PO TABS: 10.0000 mg | ORAL_TABLET | Freq: Every day | ORAL | 3 refills | Status: DC

## 2022-12-21 MED ORDER — LOSARTAN POTASSIUM 100 MG PO TABS: 100.0000 mg | ORAL_TABLET | Freq: Every day | ORAL | 3 refills | Status: DC

## 2022-12-21 MED ORDER — WARFARIN SODIUM 4 MG PO TABS: ORAL_TABLET | ORAL | 0 refills | Status: DC

## 2022-12-21 MED ORDER — CARVEDILOL 25 MG PO TABS: 25.0000 mg | ORAL_TABLET | Freq: Two times a day (BID) | ORAL | 0 refills | Status: DC

## 2022-12-21 NOTE — Telephone Encounter (Signed)
Refill Request.  

## 2022-12-21 NOTE — Telephone Encounter (Signed)
Lmovm metformin will need to be filled by PCP.

## 2022-12-23 ENCOUNTER — Ambulatory Visit: Payer: Medicaid Other | Attending: Thoracic Surgery (Cardiothoracic Vascular Surgery)

## 2022-12-23 DIAGNOSIS — Z7901 Long term (current) use of anticoagulants: Secondary | ICD-10-CM | POA: Diagnosis not present

## 2022-12-23 DIAGNOSIS — Z952 Presence of prosthetic heart valve: Secondary | ICD-10-CM | POA: Diagnosis not present

## 2022-12-23 LAB — POCT INR: INR: 1.1 — AB (ref 2.0–3.0)

## 2022-12-23 NOTE — Patient Instructions (Signed)
TAKE 2 TABLETS TODAY and THURSDAY THEN CONTINUE TAKING 1 TABLET DAILY, EXCEPT 1.5 TABLETS EVERY Monday, Wednesday and Friday.  INR in 4 weeks.  Call Bethanie Dicker, NP [phone] Glucose machine   [phone]

## 2023-01-12 ENCOUNTER — Ambulatory Visit: Payer: Medicaid Other | Attending: Thoracic Surgery (Cardiothoracic Vascular Surgery)

## 2023-01-20 DIAGNOSIS — Z419 Encounter for procedure for purposes other than remedying health state, unspecified: Secondary | ICD-10-CM | POA: Diagnosis not present

## 2023-02-20 DIAGNOSIS — Z419 Encounter for procedure for purposes other than remedying health state, unspecified: Secondary | ICD-10-CM | POA: Diagnosis not present

## 2023-02-25 ENCOUNTER — Ambulatory Visit: Payer: Medicaid Other | Attending: Physician Assistant

## 2023-03-04 ENCOUNTER — Encounter: Payer: Self-pay | Admitting: Cardiovascular Disease

## 2023-03-07 ENCOUNTER — Other Ambulatory Visit: Payer: Self-pay | Admitting: Cardiovascular Disease

## 2023-03-20 DIAGNOSIS — Z419 Encounter for procedure for purposes other than remedying health state, unspecified: Secondary | ICD-10-CM | POA: Diagnosis not present

## 2023-03-21 NOTE — Progress Notes (Unsigned)
 Cardiology Office Note  Date:  03/22/2023   ID:  TRAVIOUS VANOVER, DOB 04/10/1965, MRN [id number]  PCP:  Bethanie Dicker, NP   Chief Complaint  Patient presents with   4 month follow up     Patient has been out of his medications x 3 weeks. Patient c/o right calf pain with walking a long distance.     HPI:  KHAMBREL AMSDEN is a 58 y.o. male with a hx of  Diabetes type 2 Hypertensive heart disease Pulmonary hypertension no significant coronary disease  severe aortic stenosis, s/p AVR July 2024 mechanical valve Postop paroxysmal atrial fibrillation, sinus rhythm on amiodarone who presents for follow-up of his aortic valve disease  In follow-up today he reports he has been out of his medications for the past 3 weeks He has been off warfarin, all anti-coagulation We did discuss he has a mechanical aortic valve  Denies significant shortness of breath, no TIA symptoms Blood pressure elevated today, has been off all of his blood pressure medications including amlodipine, carvedilol, losartan  Denies significant lower extremity edema  Previously ordered echo has not been completed  EKG personally reviewed by myself on todays visit EKG Interpretation Date/Time:  Monday March 22 2023 14:22:18 EST Ventricular Rate:  87 PR Interval:  140 QRS Duration:  80 QT Interval:  378 QTC Calculation: 454 R Axis:   7  Text Interpretation: Normal sinus rhythm with sinus arrhythmia Possible Left atrial enlargement ST & T wave abnormality, consider inferolateral ischemia When compared with ECG of 17-Nov-2022 14:07, No significant change was found Confirmed by Julien Nordmann [phone]) on 03/22/2023 2:33:29 PM   Prior cardiac history reviewed  admitted in 05/2022 with NSTEMI and hypertensive urgency.  Cath did not show significant CAD, but it showed severe Aortic stenosis.  Echo showed aortic valve area of 0.95cm2 with a mean gradient 41.78mmHg.  Cath showed aortic valve area of 0.77 with a mean gradient of     hospital 08/17/22 for aortic valve replacement.  Postop went into afib RVR and was started on IV amiodarone with quick conversion to NSR. He was sent home with amiodarone 200mg  daily.    Patient was last seen in August 2024 and is overall stable from a cardiac perspective.  He had minimal shortness of breath. He was in NSR.    Heart monitor showed normal sinus rhythm,  1 run of VT, 5 runs of SVT.    Repeat echo previously ordered but has not been completed    PMH:   has a past medical history of Aortic stenosis, Arthritis, Cerebellar stroke (HCC), Elevated hemoglobin A1c, Heart murmur, History of kidney stones, Hypertension, Polysubstance abuse (HCC), Syphilis, and Tobacco abuse.  PSH:    Past Surgical History:  Procedure Laterality Date   AORTIC VALVE REPLACEMENT N/A 08/17/2022   Procedure: AORTIC VALVE REPLACEMENT USING A 25 MM ON-X PROSTHETIC HEART VALVE;  Surgeon: [name], MD;  Location: [facility] OR;  Service: Open Heart Surgery;  Laterality: N/A;   CHOLECYSTECTOMY     RIGHT/LEFT HEART CATH AND CORONARY ANGIOGRAPHY N/A 06/12/2022   Procedure: RIGHT/LEFT HEART CATH AND CORONARY ANGIOGRAPHY;  Surgeon: Iran Ouch, MD;  Location: [facility] INVASIVE CV LAB;  Service: Cardiovascular;  Laterality: N/A;   TEE WITHOUT CARDIOVERSION N/A 08/17/2022   Procedure: TRANSESOPHAGEAL ECHOCARDIOGRAM;  Surgeon: [name], MD;  Location: [facility] OR;  Service: Open Heart Surgery;  Laterality: N/A;    Current Outpatient Medications  Medication Sig Dispense Refill   amiodarone (  PACERONE) 200 MG tablet Take 1 tablet (200 mg total) by mouth daily. 90 tablet 3   amLODipine (NORVASC) 10 MG tablet Take 1 tablet (10 mg total) by mouth daily. 90 tablet 3   atorvastatin (LIPITOR) 80 MG tablet Take 1 tablet (80 mg total) by mouth daily. 90 tablet 3   Blood Glucose Monitoring Suppl (BLOOD GLUCOSE MONITOR SYSTEM) w/Device KIT Use 1 in the morning, at noon, and at bedtime. 1 kit 0   carvedilol  (COREG) 25 MG tablet Take 1 tablet (25 mg total) by mouth 2 (two) times daily with a meal. 180 tablet 0   losartan (COZAAR) 100 MG tablet Take 1 tablet (100 mg total) by mouth daily. 90 tablet 3   metFORMIN (GLUCOPHAGE) 500 MG tablet Take 1 tablet (500 mg total) by mouth 2 (two) times daily with a meal. 60 tablet 2   warfarin (COUMADIN) 4 MG tablet NEEDS INR APPT, CALL OFFICE.Marland KitchenTAKE 1 TABLET BY MOUTH ONCE DAILY AT 4 PM OR AS DIRECTED 20 tablet 0   oxyCODONE (OXY IR/ROXICODONE) 5 MG immediate release tablet Take 1 tablet (5 mg total) by mouth every 6 (six) hours as needed for severe pain. (Patient not taking: Reported on 03/22/2023) 30 tablet 0   No current facility-administered medications for this visit.     Allergies:   Patient has no known allergies.   Social History:  The patient  reports that he has been smoking cigarettes. He has a 5 pack-year smoking history. He has never used smokeless tobacco. He reports current drug use. Drugs: Marijuana and Cocaine. He reports that he does not drink alcohol.   Family History:   family history includes Diabetes in his father; Heart disease in his sister.    Review of Systems: Review of Systems  Constitutional: Negative.   HENT: Negative.    Respiratory: Negative.    Cardiovascular: Negative.   Gastrointestinal: Negative.   Musculoskeletal: Negative.   Neurological: Negative.   Psychiatric/Behavioral: Negative.    All other systems reviewed and are negative.    PHYSICAL EXAM: VS:  BP (!) 156/90 (BP Location: Left Arm, Patient Position: Sitting, Cuff Size: Normal)   Pulse 87   Ht 5\' 9"  (1.753 m)   Wt 197 lb (89.4 kg)   SpO2 98%   BMI 29.09 kg/m  , BMI Body mass index is 29.09 kg/m. GEN: Well nourished, well developed, in no acute distress HEENT: normal Neck: no JVD, carotid bruits, or masses Cardiac: RRR; no murmurs, rubs, or gallops,no edema  Respiratory:  clear to auscultation bilaterally, normal work of breathing GI: soft,  nontender, nondistended, + BS MS: no deformity or atrophy Skin: warm and dry, no rash Neuro:  Strength and sensation are intact Psych: euthymic mood, full affect    Recent Labs: 08/18/2022: Magnesium 2.4 09/08/2022: ALT 53; BUN 21; Creatinine, Ser 1.03; Hemoglobin 13.7; Platelets 383; Potassium 4.1; Sodium 135    Lipid Panel Lab Results  Component Value Date   CHOL 75 (L) 11/17/2022   HDL 27 (L) 11/17/2022   LDLCALC 28 11/17/2022   TRIG 101 11/17/2022      Wt Readings from Last 3 Encounters:  03/22/23 197 lb (89.4 kg)  11/17/22 206 lb (93.4 kg)  09/25/22 212 lb (96.2 kg)       ASSESSMENT AND PLAN:  Problem List Items Addressed This Visit       Cardiology Problems   Essential hypertension   Relevant Orders   EKG 12-Lead (Completed)     Other  S/P AVR   Relevant Orders   EKG 12-Lead (Completed)   Type 2 diabetes mellitus with hyperglycemia, without long-term current use of insulin (HCC)   Long term (current) use of anticoagulants   Relevant Orders   EKG 12-Lead (Completed)   Other Visit Diagnoses       Severe aortic stenosis    -  Primary   Relevant Orders   EKG 12-Lead (Completed)     Hyperlipidemia, mixed         Paroxysmal A-fib (HCC)       Relevant Orders   EKG 12-Lead (Completed)      Aortic valve stenosis, status post AVR mechanical valve Very concerning today as he is off his anticoagulation, warfarin and he has mechanical valve No warfarin for the past 3 weeks Prior to that was subtherapeutic on several checks We have recommended he start Lovenox 90 mg subcu for 5 days Would start warfarin 6 mg daily for 3 days then start alternating 4 mg and 6 mg with 6 mg on Monday Wednesday Friday, 4 mg other days Follow-up with anticoagulation clinic in the next 2 weeks  Essential hypertension Recommend he restart amlodipine losartan carvedilol Refills provided  Paroxysmal atrial fibrillation Maintaining normal sinus rhythm on today's visit, denies  tachycardia or palpitations concerning for arrhythmia Will restart warfarin, carvedilol  Hyperlipidemia Recommend he restart Lipitor 80 daily   Signed, Dossie Arbour, M.D., Ph.D. [facility] [place], [place] [phone]

## 2023-03-22 ENCOUNTER — Encounter: Payer: Self-pay | Admitting: Cardiovascular Disease

## 2023-03-22 ENCOUNTER — Ambulatory Visit: Payer: Medicaid Other | Attending: Cardiovascular Disease | Admitting: Cardiovascular Disease

## 2023-03-22 VITALS — BP 156/90 | HR 87 | Ht 69.0 in | Wt 197.0 lb

## 2023-03-22 DIAGNOSIS — I10 Essential (primary) hypertension: Secondary | ICD-10-CM

## 2023-03-22 DIAGNOSIS — I480 Paroxysmal atrial fibrillation: Secondary | ICD-10-CM

## 2023-03-22 DIAGNOSIS — I350 Nonrheumatic aortic (valve) stenosis: Secondary | ICD-10-CM

## 2023-03-22 DIAGNOSIS — E782 Mixed hyperlipidemia: Secondary | ICD-10-CM | POA: Diagnosis not present

## 2023-03-22 DIAGNOSIS — E1165 Type 2 diabetes mellitus with hyperglycemia: Secondary | ICD-10-CM | POA: Diagnosis not present

## 2023-03-22 DIAGNOSIS — Z7901 Long term (current) use of anticoagulants: Secondary | ICD-10-CM

## 2023-03-22 DIAGNOSIS — Z952 Presence of prosthetic heart valve: Secondary | ICD-10-CM | POA: Diagnosis not present

## 2023-03-22 MED ORDER — WARFARIN SODIUM 4 MG PO TABS: ORAL_TABLET | ORAL | 0 refills | Status: DC

## 2023-03-22 MED ORDER — ENOXAPARIN SODIUM 100 MG/ML IJ SOSY: 90.0000 mg | PREFILLED_SYRINGE | Freq: Two times a day (BID) | INTRAMUSCULAR | 0 refills | Status: DC

## 2023-03-22 NOTE — Patient Instructions (Addendum)
 Medication Instructions:  Lovenox shot twice a day 90 units for 5 days Start warfarin 6 mg daily for three days straight, then alternate 4 mg and 6 mg (Mon/Wed/Fri 6 mg, other days 4 mg)  If you need a refill on your cardiac medications before your next appointment, please call your pharmacy.   Lab work: No new labs needed  Testing/Procedures: No new testing needed  Follow-Up: At [facility], you and your health needs are our priority.  As part of our continuing mission to provide you with exceptional heart care, we have created designated Provider Care Teams.  These Care Teams include your primary Cardiologist (physician) and Advanced Practice Providers (APPs -  Physician Assistants and Nurse Practitioners) who all work together to provide you with the care you need, when you need it.  You will need a follow up appointment in 6 months Follow up with Coumadin clinic in 2-3 weeks.   Providers on your designated Care Team:   Nicolasa Ducking, NP Eula Listen, PA-C Cadence Fransico Michael, [place]  COVID-19 Vaccine Information can be found at: [email or web address] For questions related to vaccine distribution or appointments, please email [email or web address] or call [phone].

## 2023-04-07 ENCOUNTER — Ambulatory Visit: Attending: Thoracic Surgery (Cardiothoracic Vascular Surgery)

## 2023-04-07 DIAGNOSIS — Z7901 Long term (current) use of anticoagulants: Secondary | ICD-10-CM | POA: Diagnosis not present

## 2023-04-07 DIAGNOSIS — Z952 Presence of prosthetic heart valve: Secondary | ICD-10-CM

## 2023-04-07 LAB — POCT INR: INR: 1.1 — AB (ref 2.0–3.0)

## 2023-04-07 NOTE — Patient Instructions (Signed)
 Take 2.5 tablets today only then Increase to 1.5 tablets Daily, except 1 tablet on Tuesday and Thursday.  INR in 2 weeks.  Call Bethanie Dicker, NP [phone] Glucose machine   [phone]

## 2023-04-21 ENCOUNTER — Ambulatory Visit: Attending: Thoracic Surgery (Cardiothoracic Vascular Surgery)

## 2023-05-01 DIAGNOSIS — Z419 Encounter for procedure for purposes other than remedying health state, unspecified: Secondary | ICD-10-CM | POA: Diagnosis not present

## 2023-05-31 DIAGNOSIS — Z419 Encounter for procedure for purposes other than remedying health state, unspecified: Secondary | ICD-10-CM | POA: Diagnosis not present

## 2023-06-20 ENCOUNTER — Encounter: Payer: Self-pay | Admitting: Emergency Medicine

## 2023-06-20 ENCOUNTER — Observation Stay
Admit: 2023-06-20 | Discharge: 2023-06-20 | Disposition: A | Discharge status: Home / Self Care | Attending: Medical | Admitting: Medical

## 2023-06-20 ENCOUNTER — Other Ambulatory Visit: Payer: Self-pay

## 2023-06-20 ENCOUNTER — Encounter
Admission: EM | Disposition: A | Discharge status: Home / Self Care | Payer: Self-pay | Source: Home / Self Care | Attending: Emergency Medicine

## 2023-06-20 ENCOUNTER — Observation Stay
Admission: EM | Admit: 2023-06-20 | Discharge: 2023-06-23 | Disposition: A | Discharge status: Home / Self Care | Attending: Internal Medicine | Admitting: Internal Medicine

## 2023-06-20 ENCOUNTER — Emergency Department

## 2023-06-20 DIAGNOSIS — Z951 Presence of aortocoronary bypass graft: Secondary | ICD-10-CM | POA: Insufficient documentation

## 2023-06-20 DIAGNOSIS — E119 Type 2 diabetes mellitus without complications: Secondary | ICD-10-CM | POA: Diagnosis not present

## 2023-06-20 DIAGNOSIS — F17210 Nicotine dependence, cigarettes, uncomplicated: Secondary | ICD-10-CM | POA: Insufficient documentation

## 2023-06-20 DIAGNOSIS — Z7984 Long term (current) use of oral hypoglycemic drugs: Secondary | ICD-10-CM | POA: Diagnosis not present

## 2023-06-20 DIAGNOSIS — I700 Atherosclerosis of aorta: Secondary | ICD-10-CM | POA: Diagnosis not present

## 2023-06-20 DIAGNOSIS — I2510 Atherosclerotic heart disease of native coronary artery without angina pectoris: Secondary | ICD-10-CM | POA: Insufficient documentation

## 2023-06-20 DIAGNOSIS — F1910 Other psychoactive substance abuse, uncomplicated: Secondary | ICD-10-CM | POA: Diagnosis not present

## 2023-06-20 DIAGNOSIS — N179 Acute kidney failure, unspecified: Secondary | ICD-10-CM

## 2023-06-20 DIAGNOSIS — Z7901 Long term (current) use of anticoagulants: Secondary | ICD-10-CM | POA: Diagnosis not present

## 2023-06-20 DIAGNOSIS — R7989 Other specified abnormal findings of blood chemistry: Secondary | ICD-10-CM | POA: Insufficient documentation

## 2023-06-20 DIAGNOSIS — I10 Essential (primary) hypertension: Secondary | ICD-10-CM | POA: Diagnosis present

## 2023-06-20 DIAGNOSIS — E785 Hyperlipidemia, unspecified: Secondary | ICD-10-CM | POA: Insufficient documentation

## 2023-06-20 DIAGNOSIS — R079 Chest pain, unspecified: Secondary | ICD-10-CM | POA: Diagnosis present

## 2023-06-20 DIAGNOSIS — Z79899 Other long term (current) drug therapy: Secondary | ICD-10-CM | POA: Insufficient documentation

## 2023-06-20 DIAGNOSIS — I259 Chronic ischemic heart disease, unspecified: Principal | ICD-10-CM

## 2023-06-20 DIAGNOSIS — R61 Generalized hyperhidrosis: Secondary | ICD-10-CM | POA: Diagnosis not present

## 2023-06-20 DIAGNOSIS — K859 Acute pancreatitis without necrosis or infection, unspecified: Secondary | ICD-10-CM | POA: Diagnosis not present

## 2023-06-20 DIAGNOSIS — Z8673 Personal history of transient ischemic attack (TIA), and cerebral infarction without residual deficits: Secondary | ICD-10-CM | POA: Diagnosis not present

## 2023-06-20 DIAGNOSIS — I214 Non-ST elevation (NSTEMI) myocardial infarction: Principal | ICD-10-CM | POA: Diagnosis present

## 2023-06-20 DIAGNOSIS — I213 ST elevation (STEMI) myocardial infarction of unspecified site: Secondary | ICD-10-CM | POA: Diagnosis not present

## 2023-06-20 DIAGNOSIS — F1410 Cocaine abuse, uncomplicated: Secondary | ICD-10-CM

## 2023-06-20 DIAGNOSIS — Z952 Presence of prosthetic heart valve: Secondary | ICD-10-CM | POA: Diagnosis not present

## 2023-06-20 DIAGNOSIS — I480 Paroxysmal atrial fibrillation: Secondary | ICD-10-CM | POA: Insufficient documentation

## 2023-06-20 DIAGNOSIS — R0789 Other chest pain: Secondary | ICD-10-CM | POA: Diagnosis present

## 2023-06-20 LAB — HEPARIN LEVEL (UNFRACTIONATED)
Heparin Unfractionated: <0.1 [IU]/mL — ABNORMAL LOW (ref 0.30–0.70)
Heparin Unfractionated: 0.36 [IU]/mL (ref 0.30–0.70)
Heparin Unfractionated: 0.27 [IU]/mL — ABNORMAL LOW (ref 0.30–0.70)
Heparin Unfractionated: 0.47 [IU]/mL (ref 0.30–0.70)

## 2023-06-20 LAB — CBC
HCT: 41.8 % (ref 39.0–52.0)
Hemoglobin: 14 g/dL (ref 13.0–17.0)
MCH: 31 pg (ref 26.0–34.0)
MCHC: 33.5 g/dL (ref 30.0–36.0)
MCV: 92.5 fL (ref 80.0–100.0)
Platelets: 176 10*3/uL (ref 150–400)
RBC: 4.52 MIL/uL (ref 4.22–5.81)
RDW: 13.3 % (ref 11.5–15.5)
WBC: 6.9 10*3/uL (ref 4.0–10.5)
nRBC: 0 % (ref 0.0–0.2)

## 2023-06-20 LAB — TROPONIN I (HIGH SENSITIVITY)
Troponin I (High Sensitivity): 40 ng/L — ABNORMAL HIGH (ref <18)
Troponin I (High Sensitivity): 40 ng/L — ABNORMAL HIGH (ref <18)
Troponin I (High Sensitivity): 36 ng/L — ABNORMAL HIGH (ref <18)
Troponin I (High Sensitivity): 31 ng/L — ABNORMAL HIGH (ref <18)

## 2023-06-20 LAB — COMPREHENSIVE METABOLIC PANEL WITH GFR
ALT: 22 U/L (ref 0–44)
AST: 22 U/L (ref 15–41)
Albumin: 4 g/dL (ref 3.5–5.0)
Alkaline Phosphatase: 62 U/L (ref 38–126)
Anion gap: 9 (ref 5–15)
BUN: 22 mg/dL — ABNORMAL HIGH (ref 6–20)
CO2: 24 mmol/L (ref 22–32)
Calcium: 9.1 mg/dL (ref 8.9–10.3)
Chloride: 103 mmol/L (ref 98–111)
Creatinine, Ser: 1.4 mg/dL — ABNORMAL HIGH (ref 0.61–1.24)
GFR, Estimated: 59 mL/min — ABNORMAL LOW (ref >60)
Glucose, Bld: 213 mg/dL — ABNORMAL HIGH (ref 70–99)
Potassium: 3.4 mmol/L — ABNORMAL LOW (ref 3.5–5.1)
Sodium: 136 mmol/L (ref 135–145)
Total Bilirubin: 0.8 mg/dL (ref 0.0–1.2)
Total Protein: 6.6 g/dL (ref 6.5–8.1)

## 2023-06-20 LAB — PROTIME-INR
INR: 1 (ref 0.8–1.2)
INR: 1.1 (ref 0.8–1.2)
Prothrombin Time: 13.6 s (ref 11.4–15.2)
Prothrombin Time: 14 s (ref 11.4–15.2)

## 2023-06-20 LAB — HEMOGLOBIN A1C
Hgb A1c MFr Bld: 7.7 % — ABNORMAL HIGH (ref 4.8–5.6)
Mean Plasma Glucose: 174.29 mg/dL

## 2023-06-20 LAB — URINE DRUG SCREEN, QUALITATIVE (ARMC ONLY)
Amphetamines, Ur Screen: NOT DETECTED
Barbiturates, Ur Screen: NOT DETECTED
Benzodiazepine, Ur Scrn: NOT DETECTED
Cannabinoid 50 Ng, Ur ~~LOC~~: POSITIVE — AB
Cocaine Metabolite,Ur ~~LOC~~: POSITIVE — AB
MDMA (Ecstasy)Ur Screen: NOT DETECTED
Methadone Scn, Ur: NOT DETECTED
Opiate, Ur Screen: NOT DETECTED
Phencyclidine (PCP) Ur S: NOT DETECTED
Tricyclic, Ur Screen: NOT DETECTED

## 2023-06-20 LAB — ETHANOL: Alcohol, Ethyl (B): <15 mg/dL (ref <15)

## 2023-06-20 LAB — GLUCOSE, CAPILLARY
Glucose-Capillary: 239 mg/dL — ABNORMAL HIGH (ref 70–99)
Glucose-Capillary: 173 mg/dL — ABNORMAL HIGH (ref 70–99)

## 2023-06-20 LAB — LIPASE, BLOOD: Lipase: 100 U/L — ABNORMAL HIGH (ref 11–51)

## 2023-06-20 LAB — CBG MONITORING, ED: Glucose-Capillary: 134 mg/dL — ABNORMAL HIGH (ref 70–99)

## 2023-06-20 LAB — HIV ANTIBODY (ROUTINE TESTING W REFLEX): HIV Screen 4th Generation wRfx: NONREACTIVE

## 2023-06-20 LAB — APTT: aPTT: 27 s (ref 24–36)

## 2023-06-20 SURGERY — CORONARY/GRAFT ACUTE MI REVASCULARIZATION
Anesthesia: Moderate Sedation

## 2023-06-20 MED ORDER — HEPARIN (PORCINE) IN NACL 1000-0.9 UT/500ML-% IV SOLN
INTRAVENOUS | Status: AC
  Filled 2023-06-20: qty 1000

## 2023-06-20 MED ORDER — LOSARTAN POTASSIUM 50 MG PO TABS
100.0000 mg | ORAL_TABLET | Freq: Every day | ORAL | Status: DC
  Administered 2023-06-20 – 2023-06-23 (×4): 100 mg via ORAL
  Filled 2023-06-20 (×4): qty 2

## 2023-06-20 MED ORDER — INSULIN ASPART 100 UNIT/ML IJ SOLN: 0.0000 [IU] | Freq: Every day | INTRAMUSCULAR | Status: DC

## 2023-06-20 MED ORDER — TRAZODONE HCL 50 MG PO TABS: 25.0000 mg | ORAL_TABLET | Freq: Every evening | ORAL | Status: DC | PRN

## 2023-06-20 MED ORDER — NITROGLYCERIN 0.4 MG SL SUBL
0.4000 mg | SUBLINGUAL_TABLET | SUBLINGUAL | Status: DC | PRN
  Administered 2023-06-20: 0.4 mg via SUBLINGUAL

## 2023-06-20 MED ORDER — INSULIN ASPART 100 UNIT/ML IJ SOLN
0.0000 [IU] | Freq: Three times a day (TID) | INTRAMUSCULAR | Status: DC
  Administered 2023-06-20: 2 [IU] via SUBCUTANEOUS
  Administered 2023-06-20 – 2023-06-21 (×2): 5 [IU] via SUBCUTANEOUS
  Administered 2023-06-21 – 2023-06-22 (×2): 3 [IU] via SUBCUTANEOUS
  Administered 2023-06-22: 2 [IU] via SUBCUTANEOUS
  Administered 2023-06-23: 3 [IU] via SUBCUTANEOUS
  Filled 2023-06-20 (×7): qty 1

## 2023-06-20 MED ORDER — VERAPAMIL HCL 2.5 MG/ML IV SOLN
INTRAVENOUS | Status: AC
  Filled 2023-06-20: qty 2

## 2023-06-20 MED ORDER — WARFARIN SODIUM 6 MG PO TABS
6.0000 mg | ORAL_TABLET | Freq: Once | ORAL | Status: AC
  Administered 2023-06-20: 6 mg via ORAL
  Filled 2023-06-20: qty 1

## 2023-06-20 MED ORDER — CARVEDILOL 25 MG PO TABS
25.0000 mg | ORAL_TABLET | Freq: Two times a day (BID) | ORAL | Status: DC
  Administered 2023-06-20 – 2023-06-22 (×6): 25 mg via ORAL
  Filled 2023-06-20 (×7): qty 1

## 2023-06-20 MED ORDER — ATORVASTATIN CALCIUM 80 MG PO TABS
80.0000 mg | ORAL_TABLET | Freq: Every day | ORAL | Status: DC
  Administered 2023-06-20 – 2023-06-23 (×4): 80 mg via ORAL
  Filled 2023-06-20: qty 4
  Filled 2023-06-20 (×3): qty 1

## 2023-06-20 MED ORDER — WARFARIN SODIUM 4 MG PO TABS: 4.0000 mg | ORAL_TABLET | ORAL | Status: DC

## 2023-06-20 MED ORDER — ALPRAZOLAM 0.25 MG PO TABS: 0.2500 mg | ORAL_TABLET | Freq: Two times a day (BID) | ORAL | Status: DC | PRN

## 2023-06-20 MED ORDER — ATORVASTATIN CALCIUM 20 MG PO TABS: 80.0000 mg | ORAL_TABLET | Freq: Every day | ORAL | Status: DC

## 2023-06-20 MED ORDER — HEPARIN SODIUM (PORCINE) 5000 UNIT/ML IJ SOLN
4000.0000 [IU] | Freq: Once | INTRAMUSCULAR | Status: AC
  Administered 2023-06-20: 4000 [IU] via INTRAVENOUS
  Filled 2023-06-20: qty 1

## 2023-06-20 MED ORDER — MORPHINE SULFATE (PF) 2 MG/ML IV SOLN: 2.0000 mg | INTRAVENOUS | Status: DC | PRN

## 2023-06-20 MED ORDER — ONDANSETRON HCL 4 MG/2ML IJ SOLN: 4.0000 mg | Freq: Four times a day (QID) | INTRAMUSCULAR | Status: DC | PRN

## 2023-06-20 MED ORDER — SODIUM CHLORIDE 0.9 % IV SOLN: INTRAVENOUS | Status: DC

## 2023-06-20 MED ORDER — HEPARIN (PORCINE) 25000 UT/250ML-% IV SOLN: 14.0000 [IU]/kg/h | INTRAVENOUS | Status: DC

## 2023-06-20 MED ORDER — AMLODIPINE BESYLATE 10 MG PO TABS
10.0000 mg | ORAL_TABLET | Freq: Every day | ORAL | Status: DC
  Administered 2023-06-20 – 2023-06-23 (×4): 10 mg via ORAL
  Filled 2023-06-20 (×2): qty 1
  Filled 2023-06-20: qty 2
  Filled 2023-06-20: qty 1

## 2023-06-20 MED ORDER — LIDOCAINE HCL 1 % IJ SOLN
INTRAMUSCULAR | Status: AC
  Filled 2023-06-20: qty 20

## 2023-06-20 MED ORDER — ACETAMINOPHEN 325 MG PO TABS
650.0000 mg | ORAL_TABLET | ORAL | Status: DC | PRN
  Administered 2023-06-21 – 2023-06-22 (×4): 650 mg via ORAL
  Filled 2023-06-20 (×4): qty 2

## 2023-06-20 MED ORDER — WARFARIN - PHARMACIST DOSING INPATIENT
Freq: Every day | Status: DC
  Filled 2023-06-20: qty 1

## 2023-06-20 MED ORDER — HEPARIN BOLUS VIA INFUSION
1300.0000 [IU] | Freq: Once | INTRAVENOUS | Status: AC
  Administered 2023-06-20: 1300 [IU] via INTRAVENOUS
  Filled 2023-06-20: qty 1300

## 2023-06-20 MED ORDER — MAGNESIUM HYDROXIDE 400 MG/5ML PO SUSP: 30.0000 mL | Freq: Every day | ORAL | Status: DC | PRN

## 2023-06-20 MED ORDER — HEPARIN (PORCINE) 25000 UT/250ML-% IV SOLN
1400.0000 [IU]/h | INTRAVENOUS | Status: DC
  Administered 2023-06-20: 1200 [IU]/h via INTRAVENOUS
  Administered 2023-06-20 – 2023-06-21 (×2): 1400 [IU]/h via INTRAVENOUS
  Filled 2023-06-20 (×3): qty 250

## 2023-06-20 MED ORDER — POTASSIUM CHLORIDE 20 MEQ PO PACK
40.0000 meq | PACK | Freq: Once | ORAL | Status: AC
  Administered 2023-06-20: 40 meq via ORAL
  Filled 2023-06-20: qty 2

## 2023-06-20 NOTE — Progress Notes (Signed)
 ANTICOAGULATION CONSULT NOTE  Pharmacy Consult for heparin  infusion Indication: ACS/STEMI  No Known Allergies  Patient Measurements: Weight: 89.6 kg (197 lb 8 oz) Heparin  Dosing Weight: 88.8 kg  Vital Signs: Temp: 98.5 F (36.9 C) (06/01 0025) Temp Source: Oral (06/01 0025) BP: 148/84 (06/01 0025) Pulse Rate: 85 (06/01 0025)  Labs: Recent Labs    06/20/23 0023  HGB 14.0  HCT 41.8  PLT 176  CREATININE 1.40*    Estimated Creatinine Clearance: 65.3 mL/min (A) (by C-G formula based on SCr of 1.4 mg/dL (H)).   Medical History: Past Medical History:  Diagnosis Date   Aortic stenosis    a. 11/2017 Echo: EF 60-65%. No rwma, Gr2 DD, mod AS (AVA 1.12 cm^2), mild AI/MR.   Arthritis    Cerebellar stroke (HCC)    a. 11/2017 in setting of drug abuse.   Elevated hemoglobin A1c    8.7% 08/13/22   Heart murmur    History of kidney stones    Hypertension    Polysubstance abuse (HCC)    Syphilis    Tobacco abuse     Assessment: Pt is a 58 yo male with h/o aortic valve replacement (08/17/22). Post-op he was started on warfarin with INR goal 2-2.5 for the first 3 months then 1.5 afterward.  Pt presents to ED following traffic stop, began having L chest pressure and code STEMI called.  Pt reports non-compliance with meds.  Pt given heparin  bolus 4000 units prior to going to cath labs, but MD then decided to defer cath labs at this time.    Goal of Therapy:  Heparin  level 0.3-0.7 units/ml Monitor platelets by anticoagulation protocol: Yes   Plan:  Bolused 4000 units x 1 Start heparin  infusion at 1200 units/hr Will check HL in 6 hr after start of infusion CBC daily while on heparin   Coretta Dexter, PharmD, [facility] 06/20/2023 12:59 AM

## 2023-06-20 NOTE — ED Provider Notes (Signed)
 [facility] Provider Note    Event Date/Time   First MD Initiated Contact with Patient 06/20/23 0020     (approximate)   History   Code STEMI   HPI  Christopher Spencer is a 58 y.o. male brought to the ED via EMS from scene of traffic stop.  Patient with a history of CAD status post CABG who admits to marijuana use tonight.  He was at a traffic stop by police when he experienced left-sided chest pressure.  Had a similar episode last week and he was sent home from work.  Reports noncompliance with his medications x 1 month.  Received 1 nitroglycerin  spray, 324 baby aspirin  prior to arrival.  EMS reports patient diaphoretic on the scene.  Denies associated shortness of breath, nausea/vomiting or dizziness.     Past Medical History   Past Medical History:  Diagnosis Date   Aortic stenosis    a. 11/2017 Echo: EF 60-65%. No rwma, Gr2 DD, mod AS (AVA 1.12 cm^2), mild AI/MR.   Arthritis    Cerebellar stroke (HCC)    a. 11/2017 in setting of drug abuse.   Elevated hemoglobin A1c    8.7% 08/13/22   Heart murmur    History of kidney stones    Hypertension    Polysubstance abuse (HCC)    Syphilis    Tobacco abuse      Active Problem List   Patient Active Problem List   Diagnosis Date Noted   Long term (current) use of anticoagulants 08/26/2022   Type 2 diabetes mellitus with hyperglycemia, without long-term current use of insulin  (HCC) 08/25/2022   Syphilis 08/25/2022   S/P AVR 08/17/2022   Non-ST elevation (NSTEMI) myocardial infarction (HCC) 06/11/2022   Severe aortic valve stenosis 06/11/2022   Ventricular hypertrophy 06/11/2022   Hypertensive heart disease with heart failure (HCC) 06/11/2022   Pulmonary hypertension, unspecified (HCC) 06/11/2022   Chest pain 05/22/2021   Cocaine abuse (HCC) 05/22/2021   Essential hypertension 05/22/2021   Hypokalemia 05/22/2021   Does not have primary care provider 05/22/2021   Seizure (HCC) 12/04/2017      Past Surgical History   Past Surgical History:  Procedure Laterality Date   AORTIC VALVE REPLACEMENT N/A 08/17/2022   Procedure: AORTIC VALVE REPLACEMENT USING A 25 MM ON-X PROSTHETIC HEART VALVE;  Surgeon: Hilarie Lovely, MD;  Location: [facility] OR;  Service: Open Heart Surgery;  Laterality: N/A;   CHOLECYSTECTOMY     RIGHT/LEFT HEART CATH AND CORONARY ANGIOGRAPHY N/A 06/12/2022   Procedure: RIGHT/LEFT HEART CATH AND CORONARY ANGIOGRAPHY;  Surgeon: Wenona Hamilton, MD;  Location: [facility] INVASIVE CV LAB;  Service: Cardiovascular;  Laterality: N/A;   TEE WITHOUT CARDIOVERSION N/A 08/17/2022   Procedure: TRANSESOPHAGEAL ECHOCARDIOGRAM;  Surgeon: Hilarie Lovely, MD;  Location: [facility] OR;  Service: Open Heart Surgery;  Laterality: N/A;     Home Medications   Prior to Admission medications   Medication Sig Start Date End Date Taking? Authorizing Provider  amLODipine  (NORVASC ) 10 MG tablet Take 1 tablet (10 mg total) by mouth daily. 12/21/22  Yes Gollan, Timothy J, MD  atorvastatin  (LIPITOR ) 80 MG tablet Take 1 tablet (80 mg total) by mouth daily. 12/21/22  Yes Gollan, Timothy J, MD  Blood Glucose Monitoring Suppl (BLOOD GLUCOSE MONITOR SYSTEM) w/Device KIT Use 1 in the morning, at noon, and at bedtime. 08/21/22  Yes Moira Andrews M, PA-C  carvedilol  (COREG ) 25 MG tablet Take 1 tablet (25 mg total) by mouth 2 (two)  times daily with a meal. 12/21/22  Yes Furth, Cadence H, PA-C  enoxaparin  (LOVENOX ) 100 MG/ML injection Inject 0.9 mLs (90 mg total) into the skin every 12 (twelve) hours. 03/22/23  Yes Gollan, Timothy J, MD  losartan  (COZAAR ) 100 MG tablet Take 1 tablet (100 mg total) by mouth daily. 12/21/22  Yes Gollan, Timothy J, MD  metFORMIN  (GLUCOPHAGE ) 500 MG tablet Take 1 tablet (500 mg total) by mouth 2 (two) times daily with a meal. 08/21/22  Yes Moira Andrews M, PA-C  oxyCODONE  (OXY IR/ROXICODONE ) 5 MG immediate release tablet Take 1 tablet (5 mg total) by mouth every 6 (six) hours  as needed for severe pain. 08/21/22  Yes Moira Andrews M, PA-C  warfarin (COUMADIN ) 4 MG tablet Take 6 MG (1.5 tablets) for first three days. Then take 4 MG (1 tablet) Tues, Thurs, Sat and Sundays. Take 6 MG (1.5 MG) daily on Mon, Wed and Fridays. 03/22/23  Yes Gollan, Timothy J, MD  amiodarone  (PACERONE ) 200 MG tablet Take 1 tablet (200 mg total) by mouth daily. Patient not taking: Reported on 06/20/2023 12/21/22   Gollan, Timothy J, MD     Allergies  Patient has no known allergies.   Family History   Family History  Problem Relation Age of Onset   Diabetes Father    Heart disease Sister      Physical Exam  Triage Vital Signs: ED Triage Vitals  Encounter Vitals Group     BP      Systolic BP Percentile      Diastolic BP Percentile      Pulse      Resp      Temp      Temp src      SpO2      Weight      Height      Head Circumference      Peak Flow      Pain Score      Pain Loc      Pain Education      Exclude from Growth Chart     Updated Vital Signs: BP 126/76 (BP Location: Right Arm)   Pulse 64   Temp 98 F (36.7 C)   Resp 20   Wt 89.6 kg   SpO2 100%   BMI 29.17 kg/m    General: Awake, moderate distress.  CV:  RRR.  Good peripheral perfusion.  Resp:  Normal effort.  CTAB. Abd:  Nontender.  No distention.  Other:  Bilateral calves are supple without tenderness.   ED Results / Procedures / Treatments  Labs (all labs ordered are listed, but only abnormal results are displayed) Labs Reviewed  COMPREHENSIVE METABOLIC PANEL WITH GFR - Abnormal; Notable for the following components:      Result Value   Potassium 3.4 (*)    Glucose, Bld 213 (*)    BUN 22 (*)    Creatinine, Ser 1.40 (*)    GFR, Estimated 59 (*)    All other components within normal limits  LIPASE, BLOOD - Abnormal; Notable for the following components:   Lipase 100 (*)    All other components within normal limits  HEPARIN  LEVEL (UNFRACTIONATED) - Abnormal; Notable for the following  components:   Heparin  Unfractionated <0.10 (*)    All other components within normal limits  TROPONIN I (HIGH SENSITIVITY) - Abnormal; Notable for the following components:   Troponin I (High Sensitivity) 40 (*)    All other components within normal limits  TROPONIN  I (HIGH SENSITIVITY) - Abnormal; Notable for the following components:   Troponin I (High Sensitivity) 40 (*)    All other components within normal limits  CBC  ETHANOL  APTT  PROTIME-INR  URINE DRUG SCREEN, QUALITATIVE ([facility] ONLY)  HIV ANTIBODY (ROUTINE TESTING W REFLEX)  HEPARIN  LEVEL (UNFRACTIONATED)  TROPONIN I (HIGH SENSITIVITY)     EKG  ED ECG REPORT I, Dawnell Bryant J, the attending physician, personally viewed and interpreted this ECG.   Date: 06/20/2023  EKG Time: 0021  Rate: 72  Rhythm: normal sinus rhythm  Axis: LAD  Intervals:none  ST&T Change: ST elevation V1-V3 New from prior EKG dated 03/21/2023   RADIOLOGY I have independently visualized and interpreted patient's imaging study as well as noted the radiology interpretation:  Chest x-ray: No acute cardiopulmonary process  Official radiology report(s): DG Chest Port 1 View Result Date: 06/20/2023 EXAM: 1 VIEW XRAY OF THE CHEST 06/20/2023 12:42:18 AM COMPARISON: 09/25/2022 CLINICAL HISTORY: STEMI; PER ER NOT; BIB EMS from scene of traffic stop. Pt began having left chest pressure. Diaphoretic on scene. 1 spray nitroglycerin  324 asa, and 18G LAC. EKG transmitted. EDP called STEMI. CBG 218. Heart surgery about 8 months ago. Non compliant with meds. Is on blood thinner but patient reports he has not taken it in over a month. FINDINGS: LUNGS AND PLEURA: No focal pulmonary opacity. No pulmonary edema. No pleural effusion. No pneumothorax. HEART AND MEDIASTINUM: No acute abnormality of the cardiac and mediastinal silhouettes. Median sternotomy. Thoracic aortic atherosclerosis. BONES AND SOFT TISSUES: No acute osseous abnormality. Defibrillator pads overlying the  left hemithorax. IMPRESSION: 1. No acute process. Electronically signed by: Zadie Herter MD 06/20/2023 12:48 AM EDT RP Workstation: [id number]     PROCEDURES:  Critical Care performed: Yes, see critical care procedure note(s)  CRITICAL CARE Performed by: Norlene Beavers   Total critical care time: 45 minutes  Critical care time was exclusive of separately billable procedures and treating other patients.  Critical care was necessary to treat or prevent imminent or life-threatening deterioration.  Critical care was time spent personally by me on the following activities: development of treatment plan with patient and/or surrogate as well as nursing, discussions with consultants, evaluation of patient's response to treatment, examination of patient, obtaining history from patient or surrogate, ordering and performing treatments and interventions, ordering and review of laboratory studies, ordering and review of radiographic studies, pulse oximetry and re-evaluation of patient's condition.   Aaron Aas1-3 Lead EKG Interpretation  Performed by: Norlene Beavers, MD Authorized by: Norlene Beavers, MD     Interpretation: normal     ECG rate:  70   ECG rate assessment: normal     Rhythm: sinus rhythm     Ectopy: none     Conduction: normal   Comments:     Patient placed on cardiac monitor to evaluate for arrhythmias    MEDICATIONS ORDERED IN ED: Medications  nitroGLYCERIN  (NITROSTAT ) SL tablet 0.4 mg (0.4 mg Sublingual Given 06/20/23 0027)  heparin  ADULT infusion 100 units/mL (25000 units/250mL) (1,200 Units/hr Intravenous Infusion Verify 06/20/23 0505)  acetaminophen  (TYLENOL ) tablet 650 mg (has no administration in time range)  ondansetron  (ZOFRAN ) injection 4 mg (has no administration in time range)  0.9 %  sodium chloride  infusion ( Intravenous New Bag/Given 06/20/23 0509)  ALPRAZolam (XANAX) tablet 0.25 mg (has no administration in time range)  magnesium  hydroxide (MILK OF MAGNESIA) suspension 30  mL (has no administration in time range)  morphine  (PF) 2 MG/ML injection 2 mg (has  no administration in time range)  traZODone (DESYREL) tablet 25 mg (has no administration in time range)  atorvastatin  (LIPITOR ) tablet 80 mg (80 mg Oral Patient Refused/Not Given 06/20/23 0154)  heparin  injection 4,000 Units (4,000 Units Intravenous Given 06/20/23 0031)     IMPRESSION / MDM / ASSESSMENT AND PLAN / ED COURSE  I reviewed the triage vital signs and the nursing notes.                             58 year old male presenting with chest pain. Differential diagnosis includes, but is not limited to, ACS, aortic dissection, pulmonary embolism, cardiac tamponade, pneumothorax, pneumonia, pericarditis, myocarditis, GI-related causes including esophagitis/gastritis, and musculoskeletal chest wall pain.   I personally reviewed patient's records and note a cardiology office visit on 03/22/2023 for follow-up severe aortic stenosis, status post AVR, hyperlipidemia, PAF, hypertension, diabetes.  Patient's presentation is most consistent with acute presentation with potential threat to life or bodily function.  The patient is on the cardiac monitor to evaluate for evidence of arrhythmia and/or significant heart rate changes.  EMS activated code STEMI in the field.  Patient currently complains of 7/10 chest pain; will start sublingual nitroglycerin  every 5 minutes, IV Heparin  bolus.  Clinical Course as of 06/20/23 0547  Sun Jun 20, 2023  0059 Appreciate Dr. Parks Bollman from cardiology evaluating patient at bedside.  Patient had LHC in May which demonstrated minimal disease.  EKG not significantly changed from prior.  Does not recommend Cath Lab.  Does recommend initiating heparin  drip and admitting to hospitalist services. [JS]  0123 Initial troponin 40.  Will consult hospitalist services for evaluation and admission. [JS]    Clinical Course User Index [JS] Norlene Beavers, MD     FINAL CLINICAL IMPRESSION(S) / ED  DIAGNOSES   Final diagnoses:  Chest pain due to myocardial ischemia, unspecified ischemic chest pain type  AKI (acute kidney injury) (HCC)  Acute pancreatitis without infection or necrosis, unspecified pancreatitis type  NSTEMI (non-ST elevation myocardial infarction) (HCC)     Rx / DC Orders   ED Discharge Orders     None        Note:  This document was prepared using Dragon voice recognition software and may include unintentional dictation errors.   Josalynn Johndrow J, MD 06/20/23 [phone]

## 2023-06-20 NOTE — Assessment & Plan Note (Addendum)
-   Will resume IV heparin  and Coumadin  with target INR of 2.5-3.5.

## 2023-06-20 NOTE — Progress Notes (Signed)
 Interim no charge progress note  This patient was admitted by my colleague early this morning with concerns of NSTEMI.  The patient was complaining of left-sided chest pain and does endorse recent history of crack cocaine abuse.  Troponin has been trended and is relatively flat.  Initially was an NSTEMI but this was canceled by cardiology. Cardiology was consulted and evaluated the patient today.  They are currently planning for a nuclear stress test tomorrow morning but no plans for cardiac catheterization. Myself and cardiology have reiterated to patient the importance of strict abstinence from polysubstances, but most importantly cocaine given his cardiac history.  He endorses understanding. At present the patient reports his chest pain is starting to resolve.  He has no shortness of breath.  He does admit to inconsistent adherence to medications. We will continue with blood pressure control, statin, Coreg , heparin  drip per cardiology.  Of note patient will discharge into police custody.     06/20/2023   12:59 PM 06/20/2023    9:55 AM 06/20/2023    9:54 AM  Vitals with BMI  Systolic 145 144 161  Diastolic 79 128 128  Pulse 53 58 58
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
## 2023-06-20 NOTE — ED Notes (Signed)
 Pt in police custody when this RN took over. Police taking shifts outside Pts door.
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
## 2023-06-20 NOTE — ED Notes (Signed)
 4000 units heparin  given however unable to chart d/t pharmacy being in the chart

## 2023-06-20 NOTE — Assessment & Plan Note (Signed)
-   Will continue high-dose statin therapy.

## 2023-06-20 NOTE — Assessment & Plan Note (Signed)
 -  Continue antihypertensive therapy ?

## 2023-06-20 NOTE — Assessment & Plan Note (Signed)
-   Will be placed on supplemental coverage with NovoLog . - Will hold off metformin .
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
## 2023-06-20 NOTE — Assessment & Plan Note (Addendum)
-   The patient will be admitted to an observation cardiac telemetry bed. - Will follow serial troponins and EKGs. - Will continue IV heparin  for now. - The patient will be placed on aspirin  as well as p.r.n. sublingual nitroglycerin  and morphine  sulfate for pain. - Will continue beta-blocker therapy with Coreg  and ARB therapy with Cozaar  as well as high-dose statin therapy. - We will obtain a cardiology consult in a.m. for further cardiac risk stratification. - I notified [facility] group about the patient.
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
## 2023-06-20 NOTE — Assessment & Plan Note (Addendum)
-   This includes tobacco, marijuana and cocaine. - He was counseled for cessation and will receive further counseling here.

## 2023-06-20 NOTE — ED Notes (Signed)
 Called [facility] spoke to [facility] to put pt on phillips monitor.
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
## 2023-06-20 NOTE — ED Notes (Signed)
Dr. Parachos at bedside.  
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
## 2023-06-21 ENCOUNTER — Observation Stay

## 2023-06-21 ENCOUNTER — Observation Stay (HOSPITAL_BASED_OUTPATIENT_CLINIC_OR_DEPARTMENT_OTHER)
Admit: 2023-06-21 | Discharge: 2023-06-21 | Disposition: A | Discharge status: Home / Self Care | Attending: Medical | Admitting: Medical

## 2023-06-21 DIAGNOSIS — F1910 Other psychoactive substance abuse, uncomplicated: Secondary | ICD-10-CM | POA: Diagnosis not present

## 2023-06-21 DIAGNOSIS — R079 Chest pain, unspecified: Secondary | ICD-10-CM

## 2023-06-21 DIAGNOSIS — F1410 Cocaine abuse, uncomplicated: Secondary | ICD-10-CM | POA: Diagnosis not present

## 2023-06-21 DIAGNOSIS — I259 Chronic ischemic heart disease, unspecified: Secondary | ICD-10-CM | POA: Diagnosis not present

## 2023-06-21 DIAGNOSIS — Z952 Presence of prosthetic heart valve: Secondary | ICD-10-CM | POA: Diagnosis not present

## 2023-06-21 DIAGNOSIS — I214 Non-ST elevation (NSTEMI) myocardial infarction: Secondary | ICD-10-CM | POA: Diagnosis not present

## 2023-06-21 LAB — NM MYOCAR MULTI W/SPECT W/WALL MOTION / EF
LV dias vol: 142 mL (ref 62–150)
LV sys vol: 85 mL
MPHR: 164 {beats}/min
Nuc Stress EF: 55 %
Peak HR: 82 {beats}/min
Percent HR: 50 %
Rest HR: 57 {beats}/min
Rest Nuclear Isotope Dose: 10.7 mCi
SDS: 1
SRS: 0
SSS: 1
Stress Nuclear Isotope Dose: 31.8 mCi
TID: 1.2

## 2023-06-21 LAB — GLUCOSE, CAPILLARY
Glucose-Capillary: 166 mg/dL — ABNORMAL HIGH (ref 70–99)
Glucose-Capillary: 240 mg/dL — ABNORMAL HIGH (ref 70–99)
Glucose-Capillary: 102 mg/dL — ABNORMAL HIGH (ref 70–99)

## 2023-06-21 LAB — ECHOCARDIOGRAM COMPLETE
AR max vel: 1.25 cm2
AV Area VTI: 1.55 cm2
AV Area mean vel: 1.28 cm2
AV Mean grad: 8 mmHg
AV Peak grad: 14.4 mmHg
Ao pk vel: 1.9 m/s
Area-P 1/2: 2.79 cm2
Height: 69 in
MV VTI: 1.42 cm2
S' Lateral: 3 cm
Weight: 3160 [oz_av]

## 2023-06-21 LAB — CBC
HCT: 42.5 % (ref 39.0–52.0)
Hemoglobin: 14.5 g/dL (ref 13.0–17.0)
MCH: 30.9 pg (ref 26.0–34.0)
MCHC: 34.1 g/dL (ref 30.0–36.0)
MCV: 90.6 fL (ref 80.0–100.0)
Platelets: 162 10*3/uL (ref 150–400)
RBC: 4.69 MIL/uL (ref 4.22–5.81)
RDW: 13.4 % (ref 11.5–15.5)
WBC: 5.5 10*3/uL (ref 4.0–10.5)
nRBC: 0 % (ref 0.0–0.2)

## 2023-06-21 LAB — HEPARIN LEVEL (UNFRACTIONATED): Heparin Unfractionated: 0.48 [IU]/mL (ref 0.30–0.70)

## 2023-06-21 LAB — PROTIME-INR
INR: 1.1 (ref 0.8–1.2)
Prothrombin Time: 13.9 s (ref 11.4–15.2)

## 2023-06-21 MED ORDER — WARFARIN SODIUM 6 MG PO TABS
6.0000 mg | ORAL_TABLET | Freq: Once | ORAL | Status: AC
  Administered 2023-06-21: 6 mg via ORAL
  Filled 2023-06-21: qty 1

## 2023-06-21 MED ORDER — TECHNETIUM TC 99M TETROFOSMIN IV KIT
30.0000 | PACK | Freq: Once | INTRAVENOUS | Status: AC | PRN
Start: 2023-06-21 — End: 2023-06-21
  Administered 2023-06-21: 31.8 via INTRAVENOUS

## 2023-06-21 MED ORDER — TECHNETIUM TC 99M TETROFOSMIN IV KIT
10.0000 | PACK | Freq: Once | INTRAVENOUS | Status: AC | PRN
  Administered 2023-06-21: 10.66 via INTRAVENOUS

## 2023-06-21 MED ORDER — REGADENOSON 0.4 MG/5ML IV SOLN
0.4000 mg | Freq: Once | INTRAVENOUS | Status: AC
  Administered 2023-06-21: 0.4 mg via INTRAVENOUS
  Filled 2023-06-21: qty 5
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
## 2023-06-21 NOTE — Progress Notes (Signed)
 Patient off unit for stress test.
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
## 2023-06-21 NOTE — Progress Notes (Signed)
*  PRELIMINARY RESULTS* Echocardiogram 2D Echocardiogram has been performed.  Christopher Spencer 06/21/2023, 10:15 AM
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
## 2023-06-21 NOTE — Progress Notes (Signed)
*  PRELIMINARY RESULTS* Echocardiogram 2D Echocardiogram has been performed.  Broadus Canes 06/21/2023, 10:14 AM

## 2023-06-21 NOTE — Plan of Care (Signed)
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
## 2023-06-22 DIAGNOSIS — I214 Non-ST elevation (NSTEMI) myocardial infarction: Secondary | ICD-10-CM | POA: Diagnosis not present

## 2023-06-22 DIAGNOSIS — R079 Chest pain, unspecified: Secondary | ICD-10-CM | POA: Diagnosis not present

## 2023-06-22 DIAGNOSIS — I259 Chronic ischemic heart disease, unspecified: Secondary | ICD-10-CM | POA: Diagnosis not present

## 2023-06-22 LAB — COMPREHENSIVE METABOLIC PANEL WITH GFR
ALT: 21 U/L (ref 0–44)
AST: 14 U/L — ABNORMAL LOW (ref 15–41)
Albumin: 3.6 g/dL (ref 3.5–5.0)
Alkaline Phosphatase: 59 U/L (ref 38–126)
Anion gap: 11 (ref 5–15)
BUN: 15 mg/dL (ref 6–20)
CO2: 23 mmol/L (ref 22–32)
Calcium: 9 mg/dL (ref 8.9–10.3)
Chloride: 104 mmol/L (ref 98–111)
Creatinine, Ser: 1.05 mg/dL (ref 0.61–1.24)
GFR, Estimated: >60 mL/min (ref >60)
Glucose, Bld: 139 mg/dL — ABNORMAL HIGH (ref 70–99)
Potassium: 3.8 mmol/L (ref 3.5–5.1)
Sodium: 138 mmol/L (ref 135–145)
Total Bilirubin: 0.9 mg/dL (ref 0.0–1.2)
Total Protein: 6.2 g/dL — ABNORMAL LOW (ref 6.5–8.1)

## 2023-06-22 LAB — CBC WITH DIFFERENTIAL/PLATELET
Abs Immature Granulocytes: 0.01 10*3/uL (ref 0.00–0.07)
Basophils Absolute: 0.1 10*3/uL (ref 0.0–0.1)
Basophils Relative: 1 %
Eosinophils Absolute: 0.1 10*3/uL (ref 0.0–0.5)
Eosinophils Relative: 2 %
HCT: 43.8 % (ref 39.0–52.0)
Hemoglobin: 14.6 g/dL (ref 13.0–17.0)
Immature Granulocytes: 0 %
Lymphocytes Relative: 30 %
Lymphs Abs: 1.6 10*3/uL (ref 0.7–4.0)
MCH: 30.6 pg (ref 26.0–34.0)
MCHC: 33.3 g/dL (ref 30.0–36.0)
MCV: 91.8 fL (ref 80.0–100.0)
Monocytes Absolute: 0.5 10*3/uL (ref 0.1–1.0)
Monocytes Relative: 9 %
Neutro Abs: 3.3 10*3/uL (ref 1.7–7.7)
Neutrophils Relative %: 58 %
Platelets: 161 10*3/uL (ref 150–400)
RBC: 4.77 MIL/uL (ref 4.22–5.81)
RDW: 12.9 % (ref 11.5–15.5)
WBC: 5.5 10*3/uL (ref 4.0–10.5)
nRBC: 0 % (ref 0.0–0.2)

## 2023-06-22 LAB — PHOSPHORUS: Phosphorus: 3.4 mg/dL (ref 2.5–4.6)

## 2023-06-22 LAB — GLUCOSE, CAPILLARY
Glucose-Capillary: 161 mg/dL — ABNORMAL HIGH (ref 70–99)
Glucose-Capillary: 123 mg/dL — ABNORMAL HIGH (ref 70–99)
Glucose-Capillary: 114 mg/dL — ABNORMAL HIGH (ref 70–99)
Glucose-Capillary: 120 mg/dL — ABNORMAL HIGH (ref 70–99)
Glucose-Capillary: 154 mg/dL — ABNORMAL HIGH (ref 70–99)

## 2023-06-22 LAB — HEPARIN LEVEL (UNFRACTIONATED): Heparin Unfractionated: 0.42 [IU]/mL (ref 0.30–0.70)

## 2023-06-22 LAB — PROTIME-INR
INR: 1.1 (ref 0.8–1.2)
Prothrombin Time: 13.9 s (ref 11.4–15.2)

## 2023-06-22 LAB — MAGNESIUM: Magnesium: 1.9 mg/dL (ref 1.7–2.4)

## 2023-06-22 MED ORDER — ENOXAPARIN SODIUM 100 MG/ML IJ SOSY
1.0000 mg/kg | PREFILLED_SYRINGE | Freq: Two times a day (BID) | INTRAMUSCULAR | Status: DC
  Administered 2023-06-22 – 2023-06-23 (×3): 90 mg via SUBCUTANEOUS
  Filled 2023-06-22 (×4): qty 1

## 2023-06-22 MED ORDER — WARFARIN SODIUM 10 MG PO TABS
10.0000 mg | ORAL_TABLET | Freq: Once | ORAL | Status: AC
  Administered 2023-06-22: 10 mg via ORAL
  Filled 2023-06-22: qty 1

## 2023-06-22 MED ORDER — HYDRALAZINE HCL 25 MG PO TABS
25.0000 mg | ORAL_TABLET | Freq: Three times a day (TID) | ORAL | Status: DC
  Administered 2023-06-22 – 2023-06-23 (×3): 25 mg via ORAL
  Filled 2023-06-22 (×3): qty 1

## 2023-06-22 MED ORDER — HYDRALAZINE HCL 20 MG/ML IJ SOLN: 10.0000 mg | Freq: Four times a day (QID) | INTRAMUSCULAR | Status: DC | PRN

## 2023-06-22 NOTE — Progress Notes (Signed)
 ANTICOAGULATION CONSULT NOTE  Pharmacy Consult for heparin  infusion and warfarin dosing  Indication: ACS/STEMI  No Known Allergies  Patient Measurements: Height: 5\' 9"  (175.3 cm) Weight: 89.6 kg (197 lb 8 oz) IBW/kg (Calculated) : 70.7 Heparin  Dosing Weight: 88.8 kg  Vital Signs: Temp: 97.9 F (36.6 C) (06/03 0351) BP: 163/76 (06/03 0351) Pulse Rate: 55 (06/03 0351)  Labs: Recent Labs    06/20/23 0023 06/20/23 0214 06/20/23 0741 06/20/23 1345 06/20/23 1346 06/20/23 2058 06/21/23 0517 06/22/23 0524  HGB 14.0  --   --   --   --   --  14.5 14.6  HCT 41.8  --   --   --   --   --  42.5 43.8  PLT 176  --   --   --   --   --  162 161  APTT 27  --   --   --   --   --   --   --   LABPROT 13.6  --  14.0  --   --   --  13.9 13.9  INR 1.0  --  1.1  --   --   --  1.1 1.1  HEPARINUNFRC <0.10*  --  0.36  --    < > 0.47 0.48 0.42  CREATININE 1.40*  --   --   --   --   --   --  1.05  TROPONINIHS 40* 40* 36* 31*  --   --   --   --    < > = values in this interval not displayed.    Estimated Creatinine Clearance: 87 mL/min (by C-G formula based on SCr of 1.05 mg/dL).   Medical History: Past Medical History:  Diagnosis Date   Aortic stenosis    a. 11/2017 Echo: EF 60-65%. No rwma, Gr2 DD, mod AS (AVA 1.12 cm^2), mild AI/MR.   Arthritis    Cerebellar stroke (HCC)    a. 11/2017 in setting of drug abuse.   Elevated hemoglobin A1c    8.7% 08/13/22   Heart murmur    History of kidney stones    Hypertension    Polysubstance abuse (HCC)    Syphilis    Tobacco abuse     Assessment: Pt is a 58 yo male with h/o aortic valve replacement (08/17/22). Post-op he was started on warfarin with INR goal 2-2.5 for the first 3 months then 1.5 afterward.  Pt presents to ED following traffic stop, began having L chest pressure and code STEMI called.  Pt reports non-compliance with meds. INR subtherapeutic on admission.   CBC remain stable and INR unchanged. Will continue heparin  infusion and  warfarin until INR >/= 1.5, then okay to dc heparin .  Heparin   Date/Time Heparin  Level  Plan/Rate   06/01 @ 0741 0.36 Therapeutic x 1- rate 1200 units/hr  06/01 @ 1346 0.27 SUBtherapeutic@1200  units/hr  06/01 @ 2058 0.47 Therapeutic x 1, rate 1400 units/hr  06/02 0517 0.48 Therapeutic x 2  06/03 0524  0.42  Therapeutic x 3    Warfarin  Date/Time INR Dose  6/1 1.1  6mg   6/2 1.1        Goal of Therapy:  INR Goal: 1.5-2 (per cardiology)  Heparin  level 0.3-0.7 units/ml Monitor platelets by anticoagulation protocol: Yes   Plan:  Continue heparin  infusion at 1400 units/hr and recheck HL on 6/4 with AM labs  Heparin  level, CBC and INR with AM labs   Sherly Brodbeck D 06/22/2023 6:37 AM

## 2023-06-22 NOTE — Discharge Instructions (Signed)
 Rent/Utility/Housing  Agency Name: [facility] Agency Address: [address] [place], [place] [address] Phone: [phone] Email: [email or web address] Website: [email or web address] Service(s) Offered: Housing services, self-sufficiency, congregate meal program, weatherization program, Field seismologist program, emergency food assistance,  housing counseling, home ownership program, wheels -towork program.  Agency Name: [facility] Address: [address] [place], [place] [address] Phone: [phone] (8a-4p) [phone] (8p- 10p) Email: [email or web address] Website: [email or web address] Service(s) Offered: A program for homeless and/or needy men that includes one-on-one counseling, life skills training and job rehabilitation.  Agency Name: [facility] Address: [address] [place], [place] [address] Phone: [phone] Website: www.[email or web address].org Service(s) Offered: Assistance to needy in emergency with utility bills, heating fuel, and prescriptions. Shelter for homeless 7pm-7am. May 14, 2016 15  Agency Name: [facility] [place] [facility] Address: [address], [place], [place] [address] Phone: [phone] Contact Person: Cathleen Corti Email: [email or web address] Website: [email or web address] Service(s) Offered: Helps individuals with developmental disabilities move from housing that is more restrictive to homes where they  can achieve greater independence and have more  opportunities.  Agency Name: [facility] Address: [address], [place], [place] [address] Phone: [phone] Email: [email or web address] Website: [email or web address].org Service(s) Offered: Provides affordable housing for low-income families, elderly, and disabled individuals. Offer a wide range of  programs and services, from financial planning to  afterschool and summer programs.  Agency Name: [facility] Address: [address] [place], [place] [address] Phone: [phone] Service(s) Offered: Child support services; child welfare services; food stamps; Medicaid; work first family assistance; and aid with fuel,  rent, food and medicine.  Agency Name: [facility] [place] [employer] Address: [facility] [address], [place], [place]  [address] Phone: [phone] Website: www.familyabuseservices.org Service(s) Offered: 24 hour Crisis Line: [phone]; 24 hour Emergency Shelter; Transitional Housing; Support Groups; Scientist, physiological; [employer]; Hispanic Outreach: [phone];  Visitation Center: [phone].  Agency Name: [facility] [place]. Address: [address] [place], [place] [address] Phone: [phone] Service(s) Offered: CAP Services; Home and [employer]; Individual or Group Supports; Respite Care Non-Institutional Nursing;  Residential Supports; Respite Care and Personal Care Services; Transportation; Family and Friends Night; Recreational Activities; Three Nutritious Meals/Snacks; Consultation with Registered Dietician; Twenty-four hour Registered Nurse Access; Daily and [employer]; Camp Green Leaves; [place] for the [employer] (During Summer Months) Bingo Night (Every  Wednesday Night); Special Populations Dance Night  (Every Tuesday Night); Professional Hair Care Services.  Agency Name: [facility] Address: [address], [place], [place] [address] Phone: [phone] Contact Person: Jabier Mutton Website: [email or web address].Physicist, medical) Offered: Residential treatment facility for women; food and  clothing, educational & employment development and  transportation to work; Counsellor of financial skills;  parenting and family reunification; emotional and spiritual  support;  transitional housing for program graduates.  Agency Name: [facility] Address: [address] [place], [place] [address] Phone: [phone] Email: [email or web address] Website: [email or web address].es Service(s) Offered: Public housing units for elderly, disabled, and low income people; housing choice vouchers for income eligible  applicants; shelter plus care vouchers; and Psychologist, clinical.  Agency Name: [facility] [employer] Address: [address] [place], [place] [address] Phone: [phone] Email: [email or web address] Website: www.habitatalamance.org Service(s) Offered: Build houses for families in need of decent housing. Each adult in the family must invest 200 hours of labor on  someone else's house, work with volunteers to build their own house, attend classes  on budgeting, home maintenance, yard care, and attend homeowner association meetings.  Agency Name: [facility] Address: [address], [place], [place] [address] Phone: [phone] Website: www.rsli.org Service(s) Offered: Intermediate care facilities for intellectually delayed, Supervised Living in group homes for adults with developmental disabilities, Supervised Living for people who have dual diagnoses (MRMI), Independent Living, Supported Living, respite and a variety of CAP services, pre-vocational services, day supports, and [employer].  Agency Name: [place] [employer] Phone: [phone] Website: www.NCForeclosurePrevention.gov Service(s) Offered: Zero-interest, deferred loans to homeowners struggling to pay their mortgage. Call for more information.

## 2023-06-22 NOTE — Progress Notes (Signed)
 PROGRESS NOTE    QUILLAN WHITTER  ZOX:[id number] DOB: November 25, 1965 DOA: 06/20/2023 PCP: Bluford Burkitt, NP  Chief Complaint  Patient presents with   Code STEMI    Hospital Course:  MAYSIN CARSTENS is a 58 year old male with hypertension, type 2 diabetes, seizure disorder, coronary artery disease status post prior NSTEMI and CABG with aortic valve replacement, polysubstance abuse, history of cerebellar stroke who presented to the ED with left-sided chest pain.  Patient reported his chest pain has been ongoing for the last week prior to arrival.  The day of arrival patient had acute worsening.  He did endorse recent crack cocaine and marijuana use.  In the ED vital signs were stable, labs revealed hypokalemia, and creatinine of 1.4.  Patient was initially a code STEMI but this was ultimately canceled after cardiology evaluation.  Troponins were trended and mostly flat.  Patient underwent stress test and echocardiogram on 6/2 which were unremarkable.  Subjective: Acute events overnight.  Chest pain has resolved.  Patient has no complaints at this time   Objective: Vitals:   06/22/23 0351 06/22/23 0739 06/22/23 1059 06/22/23 1549  BP: (!) 163/76 (!) 182/95 (!) 149/82 (!) 173/93  Pulse: (!) 55 (!) 57 (!) 59 (!) 51  Resp: 18 19 18 18   Temp: 97.9 F (36.6 C) 98.7 F (37.1 C) 98.2 F (36.8 C) 98.1 F (36.7 C)  TempSrc:      SpO2: 98% 96% 97% 100%  Weight:      Height:        Intake/Output Summary (Last 24 hours) at 06/22/2023 1556 Last data filed at 06/22/2023 1025 Gross per 24 hour  Intake 680 ml  Output 500 ml  Net 180 ml   Filed Weights   06/20/23 0056  Weight: 89.6 kg    Examination: General exam: Appears calm and comfortable, NAD  Respiratory system: No work of breathing, symmetric chest wall expansion Cardiovascular system: S1 & S2 heard, RRR.  Gastrointestinal system: Abdomen is nondistended, soft and nontender.  Neuro: Alert and oriented. No focal neurological  deficits. Extremities: Symmetric, expected ROM Skin: No rashes, lesions Psychiatry: Demonstrates appropriate judgement and insight. Mood & affect appropriate for situation.   Assessment & Plan:  Principal Problem:   Chest pain Active Problems:   Essential hypertension   H/O prosthetic aortic valve replacement   NSTEMI (non-ST elevation myocardial infarction) (HCC)   Dyslipidemia   Type 2 diabetes mellitus without complications (HCC)   Polysubstance abuse (HCC)   AKI (acute kidney injury) (HCC)   CAD, prior CABG Acute chest pain - Serial troponins and EKG, troponin mostly flat. - Echo 55 to 60% without regional wall motion abnormalities, mild LVH  - Nuclear stress unremarkable - Chest pain likely provoked by recent drug use - Cardiac cath May 2024 without significant CAD - Cardiology was consulted but given no acute changes on stress or echo he is cleared for discharge from their standpoint.  Will need close outpatient cardiology follow-up  Aortic valve replacement On Coumadin  therapy - Status post mechanical aortic valve 07/2022 - INR goal 1.5-2.5 - Patient endorses poor medication adherence - Subtherapeutic INR at 1.1. - Pharmacy consulted for warfarin bridging - [facility] and will have inconsistent follow-up and access to Lovenox .  We will complete warfarin bridge while admitted to ensure ease of medication access in the outpatient setting.  Hypertension - Patient is meant to be on multiple antihypertensives but has not been taking any of them - Blood  pressure still elevated above goal today.  Will add hydralazine   Hyperlipidemia - Statin  Polysubstance abuse - Patient endorses crack cocaine and marijuana use - Have encouraged and counseled on cessation  AKI Baseline creatinine 1.03, 1.4 on arrival - Downtrended towards normal - Continue close monitoring - Renally dose with a creatinine clearance of 87 when needed   DVT  prophylaxis: Currently on heparin  bridge warfarin   Code Status: Full Code Disposition: Will remain in observation pending therapeutic warfarin goals. Will need to discharge to police.   Consultants:  Treatment Team:  Consulting Physician: Devorah Fonder, MD  Procedures:    Antimicrobials:  Anti-infectives (From admission, onward)    None       Data Reviewed: I have personally reviewed following labs and imaging studies CBC: Recent Labs  Lab 06/20/23 0023 06/21/23 0517 06/22/23 0524  WBC 6.9 5.5 5.5  NEUTROABS  --   --  3.3  HGB 14.0 14.5 14.6  HCT 41.8 42.5 43.8  MCV 92.5 90.6 91.8  PLT 176 162 161   Basic Metabolic Panel: Recent Labs  Lab 06/20/23 0023 06/22/23 0524  NA 136 138  K 3.4* 3.8  CL 103 104  CO2 24 23  GLUCOSE 213* 139*  BUN 22* 15  CREATININE 1.40* 1.05  CALCIUM  9.1 9.0  MG  --  1.9  PHOS  --  3.4   GFR: Estimated Creatinine Clearance: 87 mL/min (by C-G formula based on SCr of 1.05 mg/dL). Liver Function Tests: Recent Labs  Lab 06/20/23 0023 06/22/23 0524  AST 22 14*  ALT 22 21  ALKPHOS 62 59  BILITOT 0.8 0.9  PROT 6.6 6.2*  ALBUMIN  4.0 3.6   CBG: Recent Labs  Lab 06/21/23 0801 06/21/23 1654 06/21/23 2144 06/22/23 0740 06/22/23 1122  GLUCAP 166* 240* 102* 161* 123*    No results found for this or any previous visit (from the past 240 hours).   Radiology Studies: ECHOCARDIOGRAM COMPLETE Result Date: 06/21/2023    ECHOCARDIOGRAM REPORT   Patient Name:   SINAI MAHANY Date of Exam: 06/21/2023 Medical Rec #:  [id number]      Height:       69.0 in Accession #:    [id number]     Weight:       197.5 lb Date of Birth:  01/21/1965      BSA:          2.055 m Patient Age:    58 years       BP:           167/89 mmHg Patient Gender: M              HR:           59 bpm. Exam Location:  [facility] Procedure: 2D Echo, Color Doppler, Cardiac Doppler and Strain Analysis (Both            Spectral and Color Flow Doppler were utilized during  procedure). Indications:     Chest pain R07.9  History:         Patient has prior history of Echocardiogram examinations, most                  recent 06/11/2022. Signs/Symptoms:Murmur. S/P Aortic valve                  replacement.  Sonographer:     Broadus Canes Referring Phys:  [id number] CADENCE H FURTH Diagnosing Phys: Antionette Kirks MD  Sonographer Comments: Global longitudinal  strain was attempted. IMPRESSIONS  1. Left ventricular ejection fraction, by estimation, is 55 to 60%. The left ventricle has normal function. The left ventricle has no regional wall motion abnormalities. There is moderate left ventricular hypertrophy. Left ventricular diastolic parameters were normal.  2. Right ventricular systolic function is normal. The right ventricular size is normal. There is normal pulmonary artery systolic pressure.  3. The mitral valve is normal in structure. Mild to moderate mitral valve regurgitation. No evidence of mitral stenosis.  4. The aortic valve has been repaired/replaced. Aortic valve regurgitation is trivial. No aortic stenosis is present. Echo findings are consistent with normal structure and function of the aortic valve prosthesis.  5. The inferior vena cava is normal in size with greater than 50% respiratory variability, suggesting right atrial pressure of 3 mmHg. FINDINGS  Left Ventricle: Left ventricular ejection fraction, by estimation, is 55 to 60%. The left ventricle has normal function. The left ventricle has no regional wall motion abnormalities. Global longitudinal strain performed but not reported based on interpreter judgement due to suboptimal tracking. 3D ejection fraction reviewed and evaluated as part of the interpretation. Alternate measurement of EF is felt to be most reflective of LV function. The left ventricular internal cavity size was normal in  size. There is moderate left ventricular hypertrophy. Left ventricular diastolic parameters were normal. Right Ventricle: The right  ventricular size is normal. No increase in right ventricular wall thickness. Right ventricular systolic function is normal. There is normal pulmonary artery systolic pressure. The tricuspid regurgitant velocity is 1.96 m/s, and  with an assumed right atrial pressure of 3 mmHg, the estimated right ventricular systolic pressure is 18.4 mmHg. Left Atrium: Left atrial size was normal in size. Right Atrium: Right atrial size was normal in size. Pericardium: There is no evidence of pericardial effusion. Mitral Valve: The mitral valve is normal in structure. Mild to moderate mitral valve regurgitation. No evidence of mitral valve stenosis. MV peak gradient, 5.7 mmHg. The mean mitral valve gradient is 2.0 mmHg. Tricuspid Valve: The tricuspid valve is normal in structure. Tricuspid valve regurgitation is not demonstrated. No evidence of tricuspid stenosis. Aortic Valve: The aortic valve has been repaired/replaced. Aortic valve regurgitation is trivial. No aortic stenosis is present. Aortic valve mean gradient measures 8.0 mmHg. Aortic valve peak gradient measures 14.4 mmHg. Aortic valve area, by VTI measures 1.55 cm. Echo findings are consistent with normal structure and function of the aortic valve prosthesis. Pulmonic Valve: The pulmonic valve was normal in structure. Pulmonic valve regurgitation is trivial. No evidence of pulmonic stenosis. Aorta: The aortic root is normal in size and structure. Venous: The inferior vena cava is normal in size with greater than 50% respiratory variability, suggesting right atrial pressure of 3 mmHg. IAS/Shunts: No atrial level shunt detected by color flow Doppler.  LEFT VENTRICLE PLAX 2D LVIDd:         4.40 cm   Diastology LVIDs:         3.00 cm   LV e' medial:    6.09 cm/s LV PW:         1.30 cm   LV E/e' medial:  14.6 LV IVS:        1.70 cm   LV e' lateral:   9.36 cm/s LVOT diam:     2.00 cm   LV E/e' lateral: 9.5 LV SV:         52 LV SV Index:   25 LVOT Area:     3.14 cm  RIGHT  VENTRICLE RV Basal diam:  3.50 cm RV Mid diam:    2.50 cm RV S prime:     11.00 cm/s TAPSE (M-mode): 1.9 cm LEFT ATRIUM             Index        RIGHT ATRIUM           Index LA diam:        3.60 cm 1.75 cm/m   RA Area:     15.20 cm LA Vol (A2C):   55.8 ml 27.15 ml/m  RA Volume:   43.20 ml  21.02 ml/m LA Vol (A4C):   37.0 ml 18.01 ml/m LA Biplane Vol: 46.3 ml 22.53 ml/m  AORTIC VALVE AV Area (Vmax):    1.25 cm AV Area (Vmean):   1.28 cm AV Area (VTI):     1.55 cm AV Vmax:           189.75 cm/s AV Vmean:          128.750 cm/s AV VTI:            0.336 m AV Peak Grad:      14.4 mmHg AV Mean Grad:      8.0 mmHg LVOT Vmax:         75.80 cm/s LVOT Vmean:        52.500 cm/s LVOT VTI:          0.165 m LVOT/AV VTI ratio: 0.49  AORTA Ao Root diam: 2.80 cm MITRAL VALVE               TRICUSPID VALVE MV Area (PHT): 2.79 cm    TR Peak grad:   15.4 mmHg MV Area VTI:   1.42 cm    TR Vmax:        196.00 cm/s MV Peak grad:  5.7 mmHg MV Mean grad:  2.0 mmHg    SHUNTS MV Vmax:       1.19 m/s    Systemic VTI:  0.16 m MV Vmean:      62.2 cm/s   Systemic Diam: 2.00 cm MV Decel Time: 272 msec MV E velocity: 89.10 cm/s MV A velocity: 72.30 cm/s MV E/A ratio:  1.23 Antionette Kirks MD Electronically signed by Antionette Kirks MD Signature Date/Time: 06/21/2023/12:58:15 PM    Final (Updated)    NM Myocar Multi W/Spect [facility] Housekeeper Motion / EF Result Date: 06/21/2023   The study is normal. The study is low risk.   ECG was uninterpretable due to baseline repolarization abnormalities.   LV perfusion is normal. There is no evidence of ischemia. There is no evidence of infarction.   Left ventricular function is normal. End diastolic cavity size is normal. End systolic cavity size is normal.    Scheduled Meds:  amLODipine   10 mg Oral Daily   atorvastatin   80 mg Oral Daily   carvedilol   25 mg Oral BID WC   enoxaparin  (LOVENOX ) injection  1 mg/kg Subcutaneous Q12H   insulin  aspart  0-15 Units Subcutaneous TID WC   insulin  aspart  0-5 Units  Subcutaneous QHS   losartan   100 mg Oral Daily   warfarin  10 mg Oral ONCE-1600   Warfarin - Pharmacist Dosing Inpatient   Does not apply q1600   Continuous Infusions:     LOS: 0 days  MDM: Patient is high risk for one or more organ failure.  They necessitate ongoing hospitalization for continued IV therapies and subsequent lab monitoring. Total time spent interpreting  labs and vitals, reviewing the medical record, coordinating care amongst consultants and care team members, directly assessing and discussing care with the patient and/or family: 55 min  Jayd Cadieux, DO [facility]  To contact the attending physician between 7A-7P please use Epic Chat. To contact the covering physician during after hours 7P-7A, please review Amion.  06/22/2023, 3:56 PM   *This document has been created with the assistance of dictation software. Please excuse typographical errors. *

## 2023-06-22 NOTE — Plan of Care (Signed)
  Problem: Activity: Goal: Ability to tolerate increased activity will improve Outcome: Progressing   Problem: Cardiac: Goal: Ability to achieve and maintain adequate cardiovascular perfusion will improve Outcome: Progressing   Problem: Health Behavior/Discharge Planning: Goal: Ability to safely manage health-related needs after discharge will improve Outcome: Progressing   

## 2023-06-22 NOTE — Plan of Care (Signed)

## 2023-06-22 NOTE — Progress Notes (Addendum)
 ANTICOAGULATION CONSULT NOTE  Pharmacy Consult for heparin  infusion and warfarin dosing  Indication: ACS/STEMI  No Known Allergies  Patient Measurements: Height: 5\' 9"  (175.3 cm) Weight: 89.6 kg (197 lb 8 oz) IBW/kg (Calculated) : 70.7 Heparin  Dosing Weight: 88.8 kg  Vital Signs: Temp: 97.9 F (36.6 C) (06/03 0351) BP: 163/76 (06/03 0351) Pulse Rate: 55 (06/03 0351)  Labs: Recent Labs    06/20/23 0023 06/20/23 0214 06/20/23 0741 06/20/23 1345 06/20/23 1346 06/20/23 2058 06/21/23 0517 06/22/23 0524  HGB 14.0  --   --   --   --   --  14.5 14.6  HCT 41.8  --   --   --   --   --  42.5 43.8  PLT 176  --   --   --   --   --  162 161  APTT 27  --   --   --   --   --   --   --   LABPROT 13.6  --  14.0  --   --   --  13.9 13.9  INR 1.0  --  1.1  --   --   --  1.1 1.1  HEPARINUNFRC <0.10*  --  0.36  --    < > 0.47 0.48 0.42  CREATININE 1.40*  --   --   --   --   --   --  1.05  TROPONINIHS 40* 40* 36* 31*  --   --   --   --    < > = values in this interval not displayed.    Estimated Creatinine Clearance: 87 mL/min (by C-G formula based on SCr of 1.05 mg/dL).   Medical History: Past Medical History:  Diagnosis Date   Aortic stenosis    a. 11/2017 Echo: EF 60-65%. No rwma, Gr2 DD, mod AS (AVA 1.12 cm^2), mild AI/MR.   Arthritis    Cerebellar stroke (HCC)    a. 11/2017 in setting of drug abuse.   Elevated hemoglobin A1c    8.7% 08/13/22   Heart murmur    History of kidney stones    Hypertension    Polysubstance abuse (HCC)    Syphilis    Tobacco abuse     Assessment: Pt is a 58 yo male with h/o aortic valve replacement (08/17/22). Post-op he was started on warfarin with INR goal 2-2.5 for the first 3 months then 1.5 afterward.  Pt presents to ED following traffic stop, began having L chest pressure and code STEMI called.  Pt reports non-compliance with meds. INR subtherapeutic on admission.   CBC remain stable and INR unchanged. Will continue heparin  infusion and  warfarin until INR >/= 1.5, then okay to dc heparin .  Heparin   Date/Time Heparin  Level  Plan/Rate   06/01 @ 0741 0.36 Therapeutic x 1- rate 1200 units/hr  06/01 @ 1346 0.27 SUBtherapeutic@1200  units/hr  06/01 @ 2058 0.47 Therapeutic x 1, rate 1400 units/hr  06/02 0517 0.48 Therapeutic x 2  06/03 0524  0.42  Therapeutic x 3    Warfarin  Date/Time INR Dose  6/1 1.1  6mg   6/2 1.1 6mg   6/3 1.1    Goal of Therapy:  INR Goal: 1.5-2 (per cardiology)  Heparin  level 0.3-0.7 units/ml Monitor platelets by anticoagulation protocol: Yes   Plan:  Continue heparin  infusion at 1400 units/hr  Warfarin 10mg  pox 1 today at 1600 Heparin  level, CBC and INR with AM labs  Pamela Intrieri Rodriguez-Guzman PharmD, BCPS 06/22/2023 7:22 AM  6/3 @ 0753 Discussed with Dr End (card). No plans for further procedures, okay to transition from heparin  gtt to enoxaparin  until INR therapeutic. Dc heparin  infusion Start enoxaparin  1 mg/kg q 12 hours until INR >/= 1.5 Warfarin as above  Keiandra Sullenger Rodriguez-Guzman PharmD, BCPS 06/22/2023 7:59 AM

## 2023-06-22 NOTE — TOC CM/SW Note (Signed)
 Transition of Care [facility]) - Inpatient Brief Assessment   Patient Details  Name: Christopher Spencer MRN: [id number] Date of Birth: October 12, 1965  Transition of Care [facility]) CM/SW Contact:    Odilia Bennett, LCSW Phone Number: 06/22/2023, 10:50 AM   Clinical Narrative: CSW reviewed chart. SDOH flag for housing. Added resources to AVS. No other TOC needs identified so far. CSW will continue to follow progress. Please place [facility] consult if any needs arise.  Transition of Care Asessment: Insurance and Status: Insurance coverage has been reviewed Patient has primary care physician: Yes Home environment has been reviewed: Single family home Prior level of function:: Not documented Prior/Current Home Services: No current home services Social Drivers of Health Review: SDOH reviewed interventions complete Readmission risk has been reviewed: Yes Transition of care needs: no transition of care needs at this time

## 2023-06-23 DIAGNOSIS — I10 Essential (primary) hypertension: Secondary | ICD-10-CM | POA: Diagnosis not present

## 2023-06-23 DIAGNOSIS — R079 Chest pain, unspecified: Secondary | ICD-10-CM | POA: Diagnosis not present

## 2023-06-23 DIAGNOSIS — N179 Acute kidney failure, unspecified: Secondary | ICD-10-CM | POA: Diagnosis not present

## 2023-06-23 DIAGNOSIS — Z952 Presence of prosthetic heart valve: Secondary | ICD-10-CM | POA: Diagnosis not present

## 2023-06-23 LAB — CBC WITH DIFFERENTIAL/PLATELET
Abs Immature Granulocytes: 0.02 10*3/uL (ref 0.00–0.07)
Basophils Absolute: 0.1 10*3/uL (ref 0.0–0.1)
Basophils Relative: 1 %
Eosinophils Absolute: 0.1 10*3/uL (ref 0.0–0.5)
Eosinophils Relative: 1 %
HCT: 45.2 % (ref 39.0–52.0)
Hemoglobin: 14.9 g/dL (ref 13.0–17.0)
Immature Granulocytes: 0 %
Lymphocytes Relative: 33 %
Lymphs Abs: 1.9 10*3/uL (ref 0.7–4.0)
MCH: 29.9 pg (ref 26.0–34.0)
MCHC: 33 g/dL (ref 30.0–36.0)
MCV: 90.8 fL (ref 80.0–100.0)
Monocytes Absolute: 0.5 10*3/uL (ref 0.1–1.0)
Monocytes Relative: 9 %
Neutro Abs: 3.2 10*3/uL (ref 1.7–7.7)
Neutrophils Relative %: 56 %
Platelets: 157 10*3/uL (ref 150–400)
RBC: 4.98 MIL/uL (ref 4.22–5.81)
RDW: 12.7 % (ref 11.5–15.5)
WBC: 5.7 10*3/uL (ref 4.0–10.5)
nRBC: 0 % (ref 0.0–0.2)

## 2023-06-23 LAB — COMPREHENSIVE METABOLIC PANEL WITH GFR
ALT: 21 U/L (ref 0–44)
AST: 17 U/L (ref 15–41)
Albumin: 3.5 g/dL (ref 3.5–5.0)
Alkaline Phosphatase: 61 U/L (ref 38–126)
Anion gap: 8 (ref 5–15)
BUN: 18 mg/dL (ref 6–20)
CO2: 25 mmol/L (ref 22–32)
Calcium: 8.9 mg/dL (ref 8.9–10.3)
Chloride: 103 mmol/L (ref 98–111)
Creatinine, Ser: 1.15 mg/dL (ref 0.61–1.24)
GFR, Estimated: >60 mL/min (ref >60)
Glucose, Bld: 205 mg/dL — ABNORMAL HIGH (ref 70–99)
Potassium: 3.9 mmol/L (ref 3.5–5.1)
Sodium: 136 mmol/L (ref 135–145)
Total Bilirubin: 0.8 mg/dL (ref 0.0–1.2)
Total Protein: 6 g/dL — ABNORMAL LOW (ref 6.5–8.1)

## 2023-06-23 LAB — PROTIME-INR
INR: 1.2 (ref 0.8–1.2)
INR: 1.2 (ref 0.8–1.2)
Prothrombin Time: 15.4 s — ABNORMAL HIGH (ref 11.4–15.2)
Prothrombin Time: 15.8 s — ABNORMAL HIGH (ref 11.4–15.2)

## 2023-06-23 LAB — GLUCOSE, CAPILLARY
Glucose-Capillary: 172 mg/dL — ABNORMAL HIGH (ref 70–99)
Glucose-Capillary: 223 mg/dL — ABNORMAL HIGH (ref 70–99)

## 2023-06-23 LAB — MAGNESIUM: Magnesium: 1.9 mg/dL (ref 1.7–2.4)

## 2023-06-23 LAB — PHOSPHORUS: Phosphorus: 4.1 mg/dL (ref 2.5–4.6)

## 2023-06-23 MED ORDER — WARFARIN SODIUM 10 MG PO TABS
10.0000 mg | ORAL_TABLET | Freq: Once | ORAL | Status: DC
  Filled 2023-06-23: qty 1

## 2023-06-23 NOTE — Progress Notes (Signed)
Patient left without discharge paperwork.
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
## 2023-06-23 NOTE — Plan of Care (Signed)

## 2023-06-24 ENCOUNTER — Telehealth: Payer: Self-pay | Admitting: Cardiovascular Disease

## 2023-06-24 NOTE — Telephone Encounter (Signed)
-----   Message from Brodie Cannon sent at 06/23/2023 10:14 AM EDT ----- This patient is being discharged from the hospital today. Can you scheduled him follow up with Dr. Gollan or APP in 1-2 weeks? Thanks

## 2023-06-24 NOTE — Telephone Encounter (Signed)
 Unable to leave voicemail due to mailbox being full. Pt needs a hospital follow up appt in 1-2 weeks with Gollan or Cadence Furth.
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
## 2023-07-01 ENCOUNTER — Encounter: Payer: Self-pay | Admitting: Cardiovascular Disease

## 2023-07-01 DIAGNOSIS — Z419 Encounter for procedure for purposes other than remedying health state, unspecified: Secondary | ICD-10-CM | POA: Diagnosis not present

## 2023-07-01 NOTE — Telephone Encounter (Signed)
Unable to leave voicemail. Unable to reach letter being sent via mail.

## 2023-07-31 DIAGNOSIS — Z419 Encounter for procedure for purposes other than remedying health state, unspecified: Secondary | ICD-10-CM | POA: Diagnosis not present

## 2023-08-31 DIAGNOSIS — Z419 Encounter for procedure for purposes other than remedying health state, unspecified: Secondary | ICD-10-CM | POA: Diagnosis not present

## 2023-09-08 ENCOUNTER — Other Ambulatory Visit: Payer: Self-pay | Admitting: Cardiovascular Disease

## 2023-09-08 MED ORDER — LOSARTAN POTASSIUM 100 MG PO TABS
100.0000 mg | ORAL_TABLET | Freq: Every day | ORAL | 2 refills | Status: DC
Start: 2023-09-08 — End: 2023-10-19

## 2023-09-08 MED ORDER — ATORVASTATIN CALCIUM 80 MG PO TABS: 80.0000 mg | ORAL_TABLET | Freq: Every day | ORAL | 2 refills | Status: DC

## 2023-09-08 MED ORDER — CARVEDILOL 25 MG PO TABS: 25.0000 mg | ORAL_TABLET | Freq: Two times a day (BID) | ORAL | 2 refills | Status: DC

## 2023-09-08 NOTE — Telephone Encounter (Signed)
 RX sent in

## 2023-09-08 NOTE — Telephone Encounter (Signed)
 Prescription refill request received for warfarin Lov: 03/22/23 (Gollan)  Next INR check: overdue Warfarin tablet strength: 4mg   Overdue for Coumadin  Clinic appt to have INR checked. Attempted to call pt so scheduled INR check, no answer. Left message on voicemail.

## 2023-09-08 NOTE — Telephone Encounter (Signed)
*  STAT* If patient is at the pharmacy, call can be transferred to refill team.   1. Which medications need to be refilled? (please list name of each medication and dose if known) atorvastatin  (LIPITOR ) 80 MG tablet   carvedilol  (COREG ) 25 MG tablet   enoxaparin  (LOVENOX ) 100 MG/ML injection   losartan  (COZAAR ) 100 MG tablet    warfarin (COUMADIN ) 4 MG tablet    2. Which pharmacy/location (including street and city if local pharmacy) is medication to be sent to? [facility] - [place], [place] - [address]   3. Do they need a 30 day or 90 day supply? 90

## 2023-09-14 ENCOUNTER — Telehealth: Payer: Self-pay | Admitting: Cardiovascular Disease

## 2023-09-14 NOTE — Telephone Encounter (Signed)
 Attempted to call patient back to let him know that the self administration medication form has not been received from [facility] in [place] at this time.  We will have Dr. Gollan fill out form when received.  The telephone number provided and on pt's chart is not in service at this time.

## 2023-09-14 NOTE — Telephone Encounter (Signed)
 Patient says he is a resident at [facility] in [place] and he says they will be sending a fax for him to administer his own medication(s). Self administration form will be sent to [facility] for completion + signature from MD.

## 2023-09-16 NOTE — Telephone Encounter (Signed)
 LMOM for administrator to have pt call us  in regards to Dr Gollan (or this office) not yet receiving paperwork for pt to self administer medications

## 2023-09-16 NOTE — Telephone Encounter (Signed)
 Pt. Calling to f/u about Dr. Perla reviewing sent form. Please advise.

## 2023-09-21 NOTE — Telephone Encounter (Signed)
 Called and left message for call back.

## 2023-09-22 NOTE — Telephone Encounter (Signed)
 Spoke with Tom at [employer] and he states they have been trying to fax and for whatever reason it is not going through. Provided him with temporary email to send the form. He was appreciative and will get that completed once received.

## 2023-09-22 NOTE — Telephone Encounter (Signed)
 Forms received and given to Dr. Tresia nurse for his review and signature.

## 2023-09-23 ENCOUNTER — Telehealth: Payer: Self-pay | Admitting: Cardiovascular Disease

## 2023-09-23 ENCOUNTER — Other Ambulatory Visit: Payer: Self-pay | Admitting: Cardiovascular Disease

## 2023-09-23 NOTE — Telephone Encounter (Signed)
 Prescription refill request received for warfarin Lov: Furth 11/17/2022 Next INR check: pt is overdue Warfarin tablet strength: 4mg  tablets.    Attempted to call pt but the number on file is not in service.

## 2023-09-23 NOTE — Telephone Encounter (Signed)
*  STAT* If patient is at the pharmacy, call can be transferred to refill team.   1. Which medications need to be refilled? (please list name of each medication and dose if known)  warfarin (COUMADIN ) 4 MG tablet      2. Which pharmacy/location (including street and city if local pharmacy) is medication to be sent to? [facility] - [place], [place] - [address]   3. Do they need a 30 day or 90 day supply?   90 day supply  Patient is completely out of medication.

## 2023-09-24 ENCOUNTER — Other Ambulatory Visit: Payer: Self-pay | Admitting: Emergency Medicine

## 2023-09-24 ENCOUNTER — Telehealth: Payer: Self-pay | Admitting: Pharmacist

## 2023-09-24 ENCOUNTER — Other Ambulatory Visit (HOSPITAL_BASED_OUTPATIENT_CLINIC_OR_DEPARTMENT_OTHER): Payer: Self-pay

## 2023-09-24 ENCOUNTER — Other Ambulatory Visit (HOSPITAL_COMMUNITY): Payer: Self-pay

## 2023-09-24 ENCOUNTER — Other Ambulatory Visit: Payer: Self-pay

## 2023-09-24 ENCOUNTER — Telehealth: Payer: Self-pay | Admitting: Licensed Clinical Social Worker

## 2023-09-24 DIAGNOSIS — Z7901 Long term (current) use of anticoagulants: Secondary | ICD-10-CM

## 2023-09-24 DIAGNOSIS — H5213 Myopia, bilateral: Secondary | ICD-10-CM | POA: Diagnosis not present

## 2023-09-24 DIAGNOSIS — Z952 Presence of prosthetic heart valve: Secondary | ICD-10-CM

## 2023-09-24 MED ORDER — WARFARIN SODIUM 4 MG PO TABS
ORAL_TABLET | ORAL | 0 refills | Status: DC
  Filled 2023-09-24: qty 40, 30d supply, fill #0

## 2023-09-24 NOTE — Telephone Encounter (Signed)
 Form completed by Dr. Gollan and returned to [facility].

## 2023-09-24 NOTE — Telephone Encounter (Signed)
 H&V Care Navigation CSW Progress Note  Clinical Social Worker received a call back from Christopher Spencer, Quarry manager at [employer] center, along with pt (full name and DOB received, pt gives verbal permission to speak with Christopher Spencer) to coordinate transportation for pt to get to coumadin  checks. Christopher Spencer spoke with them and shared pt needs regular in person checks for care. He has transportation through [place], facility is okay with pt taking that unsupervised to appts. Their challenge was that they didn't have regular transportation through facility due to staffing to take him themselves. I provided [employer] number for rides and discussed I would get him an appt scheduled and make the first call myself to get his ride. Pt and staff aware that his coumadin  has been sent to [facility] and they will send someone to pick that up today. Pt still needs self admin form completed by MD and sent back to facility for their documentation purposes since they are not a medical facility. Christopher Spencer will reach out to RN team to see about status of that request.  Ride scheduled for pt on 9/11 at 9am, ride arranged through [facility] [phone]), will be door to door due to lack of cell phone. When appt done pt will need phone to contact ride for pick up. This has been discussed with [place], PharmD. Remain available as needed.   Patient is participating in a Managed Medicaid Plan:  Yes-[employer]  SDOH Screenings   Food Insecurity: No Food Insecurity (06/20/2023)  Housing: High Risk (06/20/2023)  Transportation Needs: No Transportation Needs (06/20/2023)  Utilities: Not At Risk (06/20/2023)  Financial Resource Strain: High Risk (12/04/2017)  Physical Activity: Sufficiently Active (12/04/2017)  Social Connections: Unknown (12/04/2017)  Stress: Stress Concern Present (12/04/2017)  Tobacco Use: High Risk (06/20/2023)    Christopher Spencer, Christopher Spencer, Christopher Spencer Clinical Social Worker II [facility] Health Heart/Vascular Care Navigation   [phone]- work cell phone (preferred)

## 2023-09-24 NOTE — Telephone Encounter (Signed)
 Patient reached out to LCSW regarding his warfarin dose.  Took 1.5 tablets (6mg ) today. Advised to continue taking 4mg  (1 tablet) daily until INR check on Thursday morning.

## 2023-09-24 NOTE — Telephone Encounter (Signed)
 H&V Care Navigation CSW Progress Note  Clinical Social Worker contacted [facility], rehab facility pt currently enrolled in, to f/u on needed coumadin  checks and medications. Pt expressed to pharmacy team that he may have challenges with transportation. Note pt has Medicaid transportation if allowed by facility staff can travel with him. No answer at [phone], left voicemail requesting return call as able.  Patient is participating in a Managed Medicaid Plan:  [place]  SDOH Screenings   Food Insecurity: No Food Insecurity (06/20/2023)  Housing: High Risk (06/20/2023)  Transportation Needs: No Transportation Needs (06/20/2023)  Utilities: Not At Risk (06/20/2023)  Financial Resource Strain: High Risk (12/04/2017)  Physical Activity: Sufficiently Active (12/04/2017)  Social Connections: Unknown (12/04/2017)  Stress: Stress Concern Present (12/04/2017)  Tobacco Use: High Risk (06/20/2023)     Marit Lark, MSW, LCSW Clinical Social Worker II [facility] Health Heart/Vascular Care Navigation  [phone]- work cell phone (preferred)

## 2023-09-24 NOTE — Telephone Encounter (Signed)
 Patient currently in rehab facility[facility], they will not let him have phone. Needs to be there for a year. Can get to [employer]. Will place lab INR lab draw and send Rx to [facility].   Will also route to LCSW to see if transportation resources are available  [facility] [address], [place], [address] [phone]

## 2023-09-30 ENCOUNTER — Ambulatory Visit: Attending: Cardiology | Admitting: *Deleted

## 2023-09-30 ENCOUNTER — Telehealth: Payer: Self-pay | Admitting: *Deleted

## 2023-09-30 ENCOUNTER — Ambulatory Visit

## 2023-09-30 DIAGNOSIS — Z952 Presence of prosthetic heart valve: Secondary | ICD-10-CM | POA: Insufficient documentation

## 2023-09-30 DIAGNOSIS — Z7689 Persons encountering health services in other specified circumstances: Secondary | ICD-10-CM | POA: Diagnosis not present

## 2023-09-30 DIAGNOSIS — Z5181 Encounter for therapeutic drug level monitoring: Secondary | ICD-10-CM | POA: Diagnosis not present

## 2023-09-30 LAB — POCT INR: POC INR: 1.1

## 2023-09-30 NOTE — Progress Notes (Signed)
 INR 1.1; Please see anticoagulation encounter  Lab Results  Component Value Date   INR 1.1 09/30/2023   INR 1.2 06/23/2023   INR 1.2 06/23/2023   Description   Take 1.5 tablets (6mg ) of warfarin today and then START taking warfarin 1.5 tablets (6mg ) daily except for 1 tablet (4mg ) on Tuesday and Thursdays. INR in 1 week.  Call [name], NP [phone] Glucose machine   [phone]

## 2023-09-30 NOTE — Telephone Encounter (Signed)
 Pt came to appointment and arranged for transportation [facility].   Called and spoke to Tom from [facility] and informed him that pt has an appointment to have INR checked 10/07/2023 at 3:00 pm. Pt had paper work concerning medications to fill out for [facility] and only filled out the information concerning Warfarin- Giving the dosing instructions from today. Made Tom aware and to call if there are any questions.

## 2023-09-30 NOTE — Patient Instructions (Addendum)
 Description   Take 1.5 tablets (6mg ) of warfarin today and then START taking warfarin 1.5 tablets (6mg ) daily except for 1 tablet (4mg ) on Tuesday and Thursdays. INR in 1 week.  Call [name], NP [phone] Glucose machine    Coumadin  clinic: [phone]

## 2023-09-30 NOTE — Telephone Encounter (Addendum)
 Pt called at 9:09 and stated that his ride has not come to pick him up. Gave pt the number to the number to the [facility] service that is suppose to be picking him up that was on the appointment.   Pt called back and stated that no one has come to pick him up. He stated that he was not able to get in touch with them.   Attempted to call [phone] to verify for him but it is saying I need a member I.D.

## 2023-09-30 NOTE — Telephone Encounter (Signed)
 Around 2:00 pm pt called and left a message stating that his ride never came to pick him up. He left number to call back. [phone].   Also, around 2:00 pm, Marit, from social work stated she called transportation because pt had not arrived yet to the office and they had forgotten to pick up the patient. She stated that she tried a 3rd time to have them pick up patient.   Attempted to call pt back- no answer.

## 2023-10-01 DIAGNOSIS — Z419 Encounter for procedure for purposes other than remedying health state, unspecified: Secondary | ICD-10-CM | POA: Diagnosis not present

## 2023-10-04 ENCOUNTER — Other Ambulatory Visit: Payer: Self-pay | Admitting: Nurse Practitioner

## 2023-10-04 ENCOUNTER — Telehealth: Payer: Self-pay | Admitting: Licensed Clinical Social Worker

## 2023-10-04 DIAGNOSIS — E1165 Type 2 diabetes mellitus with hyperglycemia: Secondary | ICD-10-CM

## 2023-10-04 NOTE — Telephone Encounter (Addendum)
 H&V Care Navigation CSW Progress Note  Clinical Social Worker received call from pt this morning with questions about transportation and medications. Pt shares he is currently not able to get his metformin , was previously prescribed by [facility] post surgery, note [facility] provider has asked that PCP manage any refills for that medication, pt provided with PCP name and phone, may need to see for appt for refill. LCSW also discussed how to set up ride for Thursday coumadin  visit. He plans to call [facility] transportation to get that scheduled. Provided him with [employer] number and his ID number again. Encouraged him to call us  if any issues.  Patient is participating in a Managed Medicaid Plan:  Yes, [employer]   SDOH Screenings   Food Insecurity: No Food Insecurity (06/20/2023)  Housing: High Risk (06/20/2023)  Transportation Needs: No Transportation Needs (06/20/2023)  Utilities: Not At Risk (06/20/2023)  Financial Resource Strain: High Risk (12/04/2017)  Physical Activity: Sufficiently Active (12/04/2017)  Social Connections: Unknown (12/04/2017)  Stress: Stress Concern Present (12/04/2017)  Tobacco Use: High Risk (06/20/2023)    Christopher Spencer, MSW, LCSW Clinical Social Worker II [facility] Health Heart/Vascular Care Navigation  [phone]- work cell phone (preferred)

## 2023-10-04 NOTE — Telephone Encounter (Unsigned)
 Copied from CRM [id number]. Topic: Clinical - Medication Refill >> Oct 04, 2023  9:31 AM Montie POUR wrote: Medication: metFORMIN  (GLUCOPHAGE ) 500 MG tablet  Has the patient contacted their pharmacy? Yes (Agent: If no, request that the patient contact the pharmacy for the refill. If patient does not wish to contact the pharmacy document the reason why and proceed with request.) (Agent: If yes, when and what did the pharmacy advise?) Pharmacy needs an order to refill  This is the patient's preferred pharmacy:  [facility] [address], [place] - [address] [place] [address] Phone: [phone] Fax: [phone]  Is this the correct pharmacy for this prescription? Yes If no, delete pharmacy and type the correct one.   Has the prescription been filled recently? No  Is the patient out of the medication? Yes  Has the patient been seen for an appointment in the last year OR does the patient have an upcoming appointment? No  Can we respond through MyChart? No  Agent: Please be advised that Rx refills may take up to 3 business days. We ask that you follow-up with your pharmacy.

## 2023-10-05 MED ORDER — METFORMIN HCL 500 MG PO TABS: 500.0000 mg | ORAL_TABLET | Freq: Two times a day (BID) | ORAL | 2 refills | Status: DC

## 2023-10-05 MED ORDER — METFORMIN HCL 500 MG PO TABS
500.0000 mg | ORAL_TABLET | Freq: Two times a day (BID) | ORAL | 11 refills | Status: DC
Start: 2023-10-05 — End: 2023-10-05

## 2023-10-05 NOTE — Addendum Note (Signed)
 Addended by: GRETEL APP on: 10/05/2023 08:52 AM   Modules accepted: Orders

## 2023-10-07 ENCOUNTER — Other Ambulatory Visit (HOSPITAL_COMMUNITY): Payer: Self-pay

## 2023-10-07 ENCOUNTER — Telehealth (HOSPITAL_BASED_OUTPATIENT_CLINIC_OR_DEPARTMENT_OTHER): Payer: Self-pay | Admitting: Licensed Clinical Social Worker

## 2023-10-07 ENCOUNTER — Ambulatory Visit: Attending: Cardiovascular Disease | Admitting: *Deleted

## 2023-10-07 DIAGNOSIS — Z5181 Encounter for therapeutic drug level monitoring: Secondary | ICD-10-CM | POA: Diagnosis not present

## 2023-10-07 DIAGNOSIS — Z952 Presence of prosthetic heart valve: Secondary | ICD-10-CM | POA: Diagnosis not present

## 2023-10-07 LAB — POCT INR: POC INR: 1.3

## 2023-10-07 NOTE — Progress Notes (Signed)
 INR 1.3; Please see anticoagulation encounter  Lab Results  Component Value Date   INR 1.3 10/07/2023   INR 1.1 09/30/2023   INR 1.2 06/23/2023    Description   Bill 00788;  Take 8 mg ( 2 tablets) of warfarin today and then START taking warfarin 6mg  (1.5 tablets) daily. Recheck INR in 1 week. Coumadin  clinic: [phone]

## 2023-10-07 NOTE — Patient Instructions (Signed)
 Description   Bill 00788;  Take 8 mg ( 2 tablets) of warfarin today and then START taking warfarin 6mg  (1.5 tablets) daily. Recheck INR in 1 week. Coumadin  clinic: [phone]

## 2023-10-07 NOTE — Telephone Encounter (Signed)
 H&V Care Navigation CSW Progress Note  Clinical Social Worker and coumadin  team tried multiple ways to get pt his ride home with Medicaid however it is complicated by his lack of cell phone. Unable to get ride to coordinate pick up. Had to schedule [facility] cab for pt. Pt given voucher via Donald, coumadin  RN to provide to the cab driver and waiver form to sign in person. Will route to transportation team once pt signs it. Pt instructed by team to look for [facility] for pick up.  Patient is participating in a Managed Medicaid Plan:  Yes- [employer]  SDOH Screenings   Food Insecurity: No Food Insecurity (06/20/2023)  Housing: High Risk (06/20/2023)  Transportation Needs: No Transportation Needs (06/20/2023)  Utilities: Not At Risk (06/20/2023)  Financial Resource Strain: High Risk (12/04/2017)  Physical Activity: Sufficiently Active (12/04/2017)  Social Connections: Unknown (12/04/2017)  Stress: Stress Concern Present (12/04/2017)  Tobacco Use: High Risk (06/20/2023)    Marit Lark, MSW, LCSW Clinical Social Worker II [facility] Health Heart/Vascular Care Navigation  [phone]- work cell phone (preferred)

## 2023-10-13 ENCOUNTER — Telehealth: Payer: Self-pay | Admitting: Licensed Clinical Social Worker

## 2023-10-13 NOTE — Telephone Encounter (Signed)
 H&V Care Navigation CSW Progress Note  Clinical Social Worker received a call from pt to inquire about upcoming appts.  Pt has newly scheduled appt with PCP tomorrow, and upcoming coumadin  check on Monday 9/29. Discussed that it may be too close to use transportation through [facility] for his appt tomorrow. Unfortunately we are not able to schedule alternate transport as it is not a hrt/vas appt. Pt states understanding will call [facility] and see if he can get a ride, if not we discussed asking if virtual visit or rescheduled visit could work for PCP team.   He will also check on upcoming ride to coumadin  clinic, reminded him that address is [address], [place], [place], [address]. He will double check and let me know if any issues.  Patient is participating in a Managed Medicaid Plan:  Yes-[employer]  SDOH Screenings   Food Insecurity: No Food Insecurity (06/20/2023)  Housing: High Risk (06/20/2023)  Transportation Needs: No Transportation Needs (06/20/2023)  Utilities: Not At Risk (06/20/2023)  Financial Resource Strain: High Risk (12/04/2017)  Physical Activity: Sufficiently Active (12/04/2017)  Social Connections: Unknown (12/04/2017)  Stress: Stress Concern Present (12/04/2017)  Tobacco Use: High Risk (06/20/2023)    Marit Lark, MSW, LCSW Clinical Social Worker II [facility] Health Heart/Vascular Care Navigation  [phone]- work cell phone (preferred)

## 2023-10-14 ENCOUNTER — Ambulatory Visit: Admitting: Nurse Practitioner

## 2023-10-18 ENCOUNTER — Ambulatory Visit: Attending: Cardiovascular Disease | Admitting: Pharmacist

## 2023-10-18 DIAGNOSIS — Z7901 Long term (current) use of anticoagulants: Secondary | ICD-10-CM | POA: Insufficient documentation

## 2023-10-18 DIAGNOSIS — Z5181 Encounter for therapeutic drug level monitoring: Secondary | ICD-10-CM | POA: Insufficient documentation

## 2023-10-18 DIAGNOSIS — Z952 Presence of prosthetic heart valve: Secondary | ICD-10-CM | POA: Diagnosis not present

## 2023-10-18 LAB — POCT INR: INR: 1.6 — AB (ref 2.0–3.0)

## 2023-10-18 NOTE — Patient Instructions (Signed)
 Description   Bill 00788;  Take 8 mg ( 2 tablets) of warfarin today and then START taking warfarin 8mg  Tuesdays and Thursdays, 6mg  (1.5 tablets) all other days of the week. Recheck INR in 1 week. Coumadin  clinic: [phone]

## 2023-10-18 NOTE — Progress Notes (Signed)
 Description   Bill 00788;  Take 8 mg ( 2 tablets) of warfarin today and then START taking warfarin 8mg  Tuesdays and Thursdays, 6mg  (1.5 tablets) all other days of the week. Recheck INR in 1 week. Coumadin  clinic: [phone]

## 2023-10-19 ENCOUNTER — Other Ambulatory Visit: Payer: Self-pay

## 2023-10-19 ENCOUNTER — Telehealth: Payer: Self-pay | Admitting: Licensed Clinical Social Worker

## 2023-10-19 ENCOUNTER — Other Ambulatory Visit (HOSPITAL_BASED_OUTPATIENT_CLINIC_OR_DEPARTMENT_OTHER): Payer: Self-pay

## 2023-10-19 ENCOUNTER — Telehealth: Payer: Self-pay

## 2023-10-19 DIAGNOSIS — Z952 Presence of prosthetic heart valve: Secondary | ICD-10-CM

## 2023-10-19 DIAGNOSIS — Z7901 Long term (current) use of anticoagulants: Secondary | ICD-10-CM

## 2023-10-19 MED ORDER — LOSARTAN POTASSIUM 100 MG PO TABS
100.0000 mg | ORAL_TABLET | Freq: Every day | ORAL | 3 refills | Status: AC
  Filled 2023-10-19 – 2023-11-15 (×2): qty 90, 90d supply, fill #0

## 2023-10-19 MED ORDER — ATORVASTATIN CALCIUM 80 MG PO TABS
80.0000 mg | ORAL_TABLET | Freq: Every day | ORAL | 3 refills | Status: AC
  Filled 2023-10-19 – 2023-12-13 (×3): qty 90, 90d supply, fill #0

## 2023-10-19 MED ORDER — CARVEDILOL 25 MG PO TABS
25.0000 mg | ORAL_TABLET | Freq: Two times a day (BID) | ORAL | 3 refills | Status: AC
  Filled 2023-10-19 – 2023-11-15 (×2): qty 180, 90d supply, fill #0

## 2023-10-19 MED ORDER — WARFARIN SODIUM 4 MG PO TABS
ORAL_TABLET | ORAL | 0 refills | Status: DC
  Filled 2023-10-19: qty 40, 30d supply, fill #0

## 2023-10-19 NOTE — Telephone Encounter (Signed)
 H&V Care Navigation CSW Progress Note  Clinical Social Worker received a call from pt to confirm he needs his meds sent to [facility] as discussed with team, to help with costs as currently no income. We discussed how to use AR account. Pt requests a call back a rehab number and gives me permission to share any updates with Charlena, program leader. LCSW was able to get pt meds (except Metformin ) sent to [facility] at [facility]. Called [phone] and updated Charlena about how pt can obtain his medications and to ask pharmacy for AR account. Tom states understanding. Encouraged he or pt to call me as needed.  Patient is participating in a Managed Medicaid Plan:  Yes [employer]  SDOH Screenings   Food Insecurity: No Food Insecurity (06/20/2023)  Housing: High Risk (06/20/2023)  Transportation Needs: No Transportation Needs (06/20/2023)  Utilities: Not At Risk (06/20/2023)  Financial Resource Strain: High Risk (12/04/2017)  Physical Activity: Sufficiently Active (12/04/2017)  Social Connections: Unknown (12/04/2017)  Stress: Stress Concern Present (12/04/2017)  Tobacco Use: High Risk (06/20/2023)    Marit Lark, MSW, LCSW Clinical Social Worker II [facility] Health Heart/Vascular Care Navigation  [phone]- work cell phone (preferred)

## 2023-10-19 NOTE — Telephone Encounter (Signed)
 Please forward Warfarin refill to [facility] as he is on a fixed income.

## 2023-10-21 ENCOUNTER — Ambulatory Visit: Admitting: Nurse Practitioner

## 2023-10-21 ENCOUNTER — Other Ambulatory Visit (HOSPITAL_BASED_OUTPATIENT_CLINIC_OR_DEPARTMENT_OTHER): Payer: Self-pay

## 2023-10-21 ENCOUNTER — Encounter: Payer: Self-pay | Admitting: Nurse Practitioner

## 2023-10-21 VITALS — BP 138/76 | HR 93 | Temp 97.8°F | Ht 69.0 in | Wt 209.2 lb

## 2023-10-21 DIAGNOSIS — Z952 Presence of prosthetic heart valve: Secondary | ICD-10-CM

## 2023-10-21 DIAGNOSIS — Z7984 Long term (current) use of oral hypoglycemic drugs: Secondary | ICD-10-CM | POA: Diagnosis not present

## 2023-10-21 DIAGNOSIS — I10 Essential (primary) hypertension: Secondary | ICD-10-CM | POA: Diagnosis not present

## 2023-10-21 DIAGNOSIS — N401 Enlarged prostate with lower urinary tract symptoms: Secondary | ICD-10-CM

## 2023-10-21 DIAGNOSIS — E1165 Type 2 diabetes mellitus with hyperglycemia: Secondary | ICD-10-CM | POA: Diagnosis not present

## 2023-10-21 DIAGNOSIS — E785 Hyperlipidemia, unspecified: Secondary | ICD-10-CM | POA: Diagnosis not present

## 2023-10-21 DIAGNOSIS — F1910 Other psychoactive substance abuse, uncomplicated: Secondary | ICD-10-CM

## 2023-10-21 DIAGNOSIS — Z1329 Encounter for screening for other suspected endocrine disorder: Secondary | ICD-10-CM | POA: Diagnosis not present

## 2023-10-21 DIAGNOSIS — Z125 Encounter for screening for malignant neoplasm of prostate: Secondary | ICD-10-CM

## 2023-10-21 DIAGNOSIS — R350 Frequency of micturition: Secondary | ICD-10-CM | POA: Diagnosis not present

## 2023-10-21 LAB — LIPID PANEL
Cholesterol: 114 mg/dL (ref 0–200)
HDL: 48.5 mg/dL (ref >39.00)
LDL Cholesterol: 29 mg/dL (ref 0–99)
NonHDL: 65.69
Total CHOL/HDL Ratio: 2
Triglycerides: 181 mg/dL — ABNORMAL HIGH (ref 0.0–149.0)
VLDL: 36.2 mg/dL (ref 0.0–40.0)

## 2023-10-21 LAB — CBC WITH DIFFERENTIAL/PLATELET
Basophils Absolute: 0 K/uL (ref 0.0–0.1)
Basophils Relative: 0.5 % (ref 0.0–3.0)
Eosinophils Absolute: 0.2 K/uL (ref 0.0–0.7)
Eosinophils Relative: 3.9 % (ref 0.0–5.0)
HCT: 40.6 % (ref 39.0–52.0)
Hemoglobin: 13.5 g/dL (ref 13.0–17.0)
Lymphocytes Relative: 25.4 % (ref 12.0–46.0)
Lymphs Abs: 1.5 K/uL (ref 0.7–4.0)
MCHC: 33.3 g/dL (ref 30.0–36.0)
MCV: 90.6 fl (ref 78.0–100.0)
Monocytes Absolute: 0.6 K/uL (ref 0.1–1.0)
Monocytes Relative: 10.6 % (ref 3.0–12.0)
Neutro Abs: 3.6 K/uL (ref 1.4–7.7)
Neutrophils Relative %: 59.6 % (ref 43.0–77.0)
Platelets: 203 K/uL (ref 150.0–400.0)
RBC: 4.48 Mil/uL (ref 4.22–5.81)
RDW: 13.9 % (ref 11.5–15.5)
WBC: 6 K/uL (ref 4.0–10.5)

## 2023-10-21 LAB — COMPREHENSIVE METABOLIC PANEL WITH GFR
ALT: 22 U/L (ref 0–53)
AST: 16 U/L (ref 0–37)
Albumin: 4.2 g/dL (ref 3.5–5.2)
Alkaline Phosphatase: 79 U/L (ref 39–117)
BUN: 20 mg/dL (ref 6–23)
CO2: 29 meq/L (ref 19–32)
Calcium: 9.4 mg/dL (ref 8.4–10.5)
Chloride: 103 meq/L (ref 96–112)
Creatinine, Ser: 1.12 mg/dL (ref 0.40–1.50)
GFR: 73.46 mL/min (ref >60.00)
Glucose, Bld: 204 mg/dL — ABNORMAL HIGH (ref 70–99)
Potassium: 4.2 meq/L (ref 3.5–5.1)
Sodium: 140 meq/L (ref 135–145)
Total Bilirubin: 0.8 mg/dL (ref 0.2–1.2)
Total Protein: 6.6 g/dL (ref 6.0–8.3)

## 2023-10-21 LAB — MICROALBUMIN / CREATININE URINE RATIO
Creatinine,U: 149 mg/dL
Microalb Creat Ratio: 14.5 mg/g (ref 0.0–30.0)
Microalb, Ur: 2.2 mg/dL — ABNORMAL HIGH (ref 0.0–1.9)

## 2023-10-21 LAB — PSA: PSA: 0.99 ng/mL (ref 0.10–4.00)

## 2023-10-21 LAB — TSH: TSH: 0.65 u[IU]/mL (ref 0.35–5.50)

## 2023-10-21 LAB — HEMOGLOBIN A1C: Hgb A1c MFr Bld: 10.2 % — ABNORMAL HIGH (ref 4.6–6.5)

## 2023-10-21 MED ORDER — BLOOD GLUCOSE TEST VI STRP
ORAL_STRIP | 3 refills | Status: AC
  Filled 2023-10-21: qty 100, 90d supply, fill #0

## 2023-10-21 MED ORDER — TAMSULOSIN HCL 0.4 MG PO CAPS
0.4000 mg | ORAL_CAPSULE | Freq: Every day | ORAL | 3 refills | Status: AC
  Filled 2023-10-21: qty 90, 90d supply, fill #0
  Filled 2024-01-25: qty 90, 90d supply, fill #1

## 2023-10-21 MED ORDER — LANCET DEVICE MISC
0 refills | Status: AC
  Filled 2023-10-21: qty 1, 30d supply, fill #0

## 2023-10-21 MED ORDER — ACCU-CHEK SOFTCLIX LANCETS MISC
3 refills | Status: AC
  Filled 2023-10-21: qty 100, 90d supply, fill #0

## 2023-10-21 MED ORDER — BLOOD GLUCOSE MONITOR KIT
PACK | 0 refills | Status: AC
  Filled 2023-10-21: qty 1, 30d supply, fill #0

## 2023-10-21 NOTE — Progress Notes (Signed)
 [name], NP-C Phone: [phone]  Christopher Spencer is a 58 y.o. male who presents today for follow up.   Discussed the use of AI scribe software for clinical note transcription with the patient, who gave verbal consent to proceed.  History of Present Illness   Christopher Spencer is a 58 year old male with diabetes who presents for a follow-up visit to manage his blood sugar levels and medication refills.  He has not checked his blood sugar in approximately two months and is interested in obtaining a glucose monitor. His last A1c, checked in June, was 7.7, with a goal to be under 7. He experiences excessive urination, particularly at night, and excessive thirst. No episodes of hypoglycemia, but he feels generally fatigued and lacking energy. He is currently taking metformin  twice a day.  He experiences urinary frequency, especially at night, getting up about three times. He describes a weak urinary stream but denies trouble initiating urination or dribbling.  He is on warfarin due to a valve replacement and attends a Coumadin  clinic for management. He also takes Lipitor  for cholesterol, Coreg , and losartan  for blood pressure. No muscle aches, abdominal pain, chest pain, shortness of breath, dizziness, or swelling. He does not regularly check his blood pressure.  He smokes cigarettes, though not a pack a day, and attributes some of his elevated blood pressure to smoking before the visit.      Social History   Tobacco Use  Smoking Status Every Day   Current packs/day: 0.50   Average packs/day: 0.5 packs/day for 10.0 years (5.0 ttl pk-yrs)   Types: Cigarettes  Smokeless Tobacco Never    Current Outpatient Medications on File Prior to Visit  Medication Sig Dispense Refill   atorvastatin  (LIPITOR ) 80 MG tablet Take 1 tablet (80 mg total) by mouth daily. 90 tablet 3   carvedilol  (COREG ) 25 MG tablet Take 1 tablet (25 mg total) by mouth 2 (two) times daily with a meal. 180 tablet 3    losartan  (COZAAR ) 100 MG tablet Take 1 tablet (100 mg total) by mouth daily. 90 tablet 3   metFORMIN  (GLUCOPHAGE ) 500 MG tablet Take 1 tablet (500 mg total) by mouth 2 (two) times daily with a meal. 60 tablet 2   warfarin (COUMADIN ) 4 MG tablet Take 6 MG (1.5 tablets) for first three days. Then take 4 MG (1 tablet) Tues, Thurs, Sat and Sundays. Take 6 MG (1.5 tablets) daily on Mon, Wed and Fridays. 40 tablet 0   No current facility-administered medications on file prior to visit.     ROS see history of present illness  Objective  Physical Exam Vitals:   10/21/23 0946 10/21/23 1004  BP: (!) 144/78 138/76  Pulse: 93   Temp: 97.8 F (36.6 C)   SpO2: 95%     BP Readings from Last 3 Encounters:  10/21/23 138/76  06/23/23 (!) 140/77  03/22/23 (!) 156/90   Wt Readings from Last 3 Encounters:  10/21/23 209 lb 3.2 oz (94.9 kg)  06/20/23 197 lb 8 oz (89.6 kg)  03/22/23 197 lb (89.4 kg)    Physical Exam Constitutional:      General: He is not in acute distress.    Appearance: Normal appearance.  HENT:     Head: Normocephalic.  Cardiovascular:     Rate and Rhythm: Normal rate and regular rhythm.     Heart sounds: Normal heart sounds.  Pulmonary:     Effort: Pulmonary effort is normal.     Breath  sounds: Normal breath sounds.  Skin:    General: Skin is warm and dry.  Neurological:     General: No focal deficit present.     Mental Status: He is alert.  Psychiatric:        Mood and Affect: Mood normal.        Behavior: Behavior normal.      Assessment/Plan: Please see individual problem list.  Type 2 diabetes mellitus with hyperglycemia, without long-term current use of insulin  (HCC) Assessment & Plan: A1c is 7.7 with a goal below 7. Polyuria and polydipsia could be due to hyperglycemia. No hypoglycemia reported. Check A1c today. Prescribe glucose monitor for daily use. Advise dietary modifications to reduce carbohydrate intake and encourage regular exercise. Continue  metformin  twice daily.   Orders: -     Hemoglobin A1c -     Microalbumin / creatinine urine ratio -     blood glucose meter kit and supplies; Use as directed  Dispense: 1 each; Refill: 0 -     Blood Glucose Test; Use as directed to check blood glucose once daily. May substitute to any manufacturer covered by patient's insurance.  Dispense: 100 strip; Refill: 3 -     Lancet Device; Use as directed to check blood glucose once daily. May substitute to any manufacturer covered by patient's insurance.  Dispense: 1 each; Refill: 0 -     Lancets Misc.; Use as directed to check blood glucose once daily. May substitute to any manufacturer covered by patient's insurance.  Dispense: 100 each; Refill: 3  Benign prostatic hyperplasia with urinary frequency Assessment & Plan: Urinary frequency, nocturia, and weak stream present. Discussed medication benefits for symptom relief. Prescribe Flomax 0.4 mg daily. Counseled on common side effects.  Orders: -     Tamsulosin HCl; Take 1 capsule (0.4 mg total) by mouth daily.  Dispense: 90 capsule; Refill: 3  Essential hypertension Assessment & Plan: Blood pressure is elevated at 144/78, managed with carvedilol  and losartan . Mild improvement noted on recheck. No symptoms of chest pain, dyspnea, dizziness, or edema. Continue carvedilol  25 mg twice daily and losartan  100 mg daily. Follow up with Cardiology.     Dyslipidemia Assessment & Plan: Managed with Lipitor . Continue. Check lipid panel today.   Orders: -     Lipid panel -     Comprehensive metabolic panel with GFR  H/O prosthetic aortic valve replacement Assessment & Plan: Continue coumadin  as prescribed. Follow up with coumadin  clinic as scheduled.   Orders: -     CBC with Differential/Platelet  Polysubstance abuse (HCC) Assessment & Plan: Currently in rehab facility.    Screening PSA (prostate specific antigen) -     PSA  Thyroid  disorder screen -     TSH     Return in about 3  months (around 01/21/2024) for Follow up.   [name], NP-C [facility]

## 2023-10-21 NOTE — Assessment & Plan Note (Signed)
 Urinary frequency, nocturia, and weak stream present. Discussed medication benefits for symptom relief. Prescribe Flomax 0.4 mg daily. Counseled on common side effects.

## 2023-10-21 NOTE — Assessment & Plan Note (Signed)
 A1c is 7.7 with a goal below 7. Polyuria and polydipsia could be due to hyperglycemia. No hypoglycemia reported. Check A1c today. Prescribe glucose monitor for daily use. Advise dietary modifications to reduce carbohydrate intake and encourage regular exercise. Continue metformin  twice daily.

## 2023-10-21 NOTE — Assessment & Plan Note (Signed)
 Continue coumadin  as prescribed. Follow up with coumadin  clinic as scheduled.

## 2023-10-21 NOTE — Assessment & Plan Note (Signed)
Currently in rehab facility.

## 2023-10-21 NOTE — Assessment & Plan Note (Signed)
 Managed with Lipitor . Continue. Check lipid panel today.

## 2023-10-21 NOTE — Assessment & Plan Note (Addendum)
 Blood pressure is elevated at 144/78, managed with carvedilol  and losartan . Mild improvement noted on recheck. No symptoms of chest pain, dyspnea, dizziness, or edema. Continue carvedilol  25 mg twice daily and losartan  100 mg daily. Follow up with Cardiology.

## 2023-10-22 ENCOUNTER — Other Ambulatory Visit (HOSPITAL_BASED_OUTPATIENT_CLINIC_OR_DEPARTMENT_OTHER): Payer: Self-pay

## 2023-10-25 ENCOUNTER — Other Ambulatory Visit (HOSPITAL_BASED_OUTPATIENT_CLINIC_OR_DEPARTMENT_OTHER): Payer: Self-pay

## 2023-10-28 ENCOUNTER — Ambulatory Visit: Attending: Cardiovascular Disease

## 2023-10-28 DIAGNOSIS — Z7689 Persons encountering health services in other specified circumstances: Secondary | ICD-10-CM | POA: Diagnosis not present

## 2023-10-28 DIAGNOSIS — Z5181 Encounter for therapeutic drug level monitoring: Secondary | ICD-10-CM | POA: Diagnosis not present

## 2023-10-28 DIAGNOSIS — Z952 Presence of prosthetic heart valve: Secondary | ICD-10-CM | POA: Diagnosis not present

## 2023-10-28 DIAGNOSIS — Z7901 Long term (current) use of anticoagulants: Secondary | ICD-10-CM | POA: Diagnosis not present

## 2023-10-28 LAB — POCT INR: INR: 2.4 (ref 2.0–3.0)

## 2023-10-28 NOTE — Progress Notes (Signed)
 INR 2.4 Please see anticoagulation encounter Zell 00788; Continue taking warfarin 8mg  Tuesdays and Thursdays, 6mg  (1.5 tablets) all other days of the week. Recheck INR in 4 weeks. Coumadin  clinic: [phone]

## 2023-10-28 NOTE — Patient Instructions (Signed)
 Bill 00788; Continue taking warfarin 8mg  Tuesdays and Thursdays, 6mg  (1.5 tablets) all other days of the week. Recheck INR in 4 weeks. Coumadin  clinic: [phone]

## 2023-11-02 ENCOUNTER — Other Ambulatory Visit: Payer: Self-pay | Admitting: Physician Assistant

## 2023-11-02 ENCOUNTER — Other Ambulatory Visit (HOSPITAL_BASED_OUTPATIENT_CLINIC_OR_DEPARTMENT_OTHER): Payer: Self-pay

## 2023-11-02 ENCOUNTER — Ambulatory Visit: Payer: Self-pay | Admitting: Nurse Practitioner

## 2023-11-03 ENCOUNTER — Telehealth: Payer: Self-pay

## 2023-11-03 NOTE — Telephone Encounter (Signed)
 Copied from CRM [id number]. Topic: Clinical - Medication Question >> Nov 03, 2023  2:29 PM Taleah C wrote: Reason for CRM:pt called and requested to speak with nurse because he was prescribed insulin  but he is in a rehab facility and he can't have needles. He would need an alternative option. Please call and advise.

## 2023-11-03 NOTE — Telephone Encounter (Signed)
 Copied from CRM [id number]. Topic: Clinical - Lab/Test Results >> Nov 03, 2023 12:44 PM Aleatha C wrote: Reason for CRM: Patient has been informed of his lab results as well as his A1c levels he says he will call back about taking the insulin  he wants to think on it

## 2023-11-04 ENCOUNTER — Other Ambulatory Visit (HOSPITAL_BASED_OUTPATIENT_CLINIC_OR_DEPARTMENT_OTHER): Payer: Self-pay

## 2023-11-04 ENCOUNTER — Other Ambulatory Visit: Payer: Self-pay | Admitting: Nurse Practitioner

## 2023-11-04 NOTE — Telephone Encounter (Unsigned)
 Copied from CRM #[id number]. Topic: Clinical - Medication Question >> Nov 04, 2023 10:13 AM Nessti S wrote: Reason for CRM: patient called because he wants to know if he can use the insulin  pen instead he can not have needles in the rehab center. Call back number [phone]

## 2023-11-04 NOTE — Telephone Encounter (Signed)
 VM left to have pt cb   E2C2 IT IS OKAY TO RELAY TO PT THAT PROVIDER HAS SENT IN THE PEN NEEDLE CAPS INSTEAD

## 2023-11-05 ENCOUNTER — Telehealth: Payer: Self-pay

## 2023-11-05 ENCOUNTER — Other Ambulatory Visit: Payer: Self-pay

## 2023-11-05 ENCOUNTER — Other Ambulatory Visit (HOSPITAL_BASED_OUTPATIENT_CLINIC_OR_DEPARTMENT_OTHER): Payer: Self-pay

## 2023-11-05 DIAGNOSIS — E1165 Type 2 diabetes mellitus with hyperglycemia: Secondary | ICD-10-CM

## 2023-11-05 MED ORDER — METFORMIN HCL 500 MG PO TABS
500.0000 mg | ORAL_TABLET | Freq: Two times a day (BID) | ORAL | 2 refills | Status: DC
  Filled 2023-11-05: qty 60, 30d supply, fill #0
  Filled 2023-12-06: qty 60, 30d supply, fill #1
  Filled 2024-01-03: qty 60, 30d supply, fill #2

## 2023-11-05 NOTE — Telephone Encounter (Signed)
 Copied from CRM [id number]. Topic: General - Call Back - No Documentation >> Nov 05, 2023  9:38 AM Christopher Spencer wrote: Reason for CRM: Patient called back and relayed note that [facility] ordered the pen needle caps.

## 2023-11-05 NOTE — Telephone Encounter (Signed)
 Called and someone stated he will have pt CB   E2C2 IT IS OKAY TO RELAY MESSAGE PLEASE SEND A NOTE BACK ONCE PT HAS BEEN INFORMED

## 2023-11-05 NOTE — Telephone Encounter (Signed)
 Sent!

## 2023-11-05 NOTE — Telephone Encounter (Signed)
 Copied from CRM #[id number]. Topic: Clinical - Prescription Issue >> Nov 05, 2023  9:39 AM Rea BROCKS wrote: Reason for CRM: metFORMIN  (GLUCOPHAGE ) 500 MG tablet    Patient put in a refill request for metformin  a couple days ago and refill request was refused. It stated refill is not appropriate.    [facility] [address], [address] [place] [address] Phone: [phone] Fax: [phone] Hours: M-F 7:30a-7:00p   [phone] (H)

## 2023-11-08 ENCOUNTER — Other Ambulatory Visit (HOSPITAL_BASED_OUTPATIENT_CLINIC_OR_DEPARTMENT_OTHER): Payer: Self-pay

## 2023-11-08 ENCOUNTER — Telehealth: Payer: Self-pay

## 2023-11-08 NOTE — Telephone Encounter (Signed)
 Called pt to informed message was left to have pt cb   E2C2 PLEASE TRANSFER CALL

## 2023-11-08 NOTE — Telephone Encounter (Signed)
 Copied from CRM #[id number]. Topic: Clinical - Medication Question >> Nov 08, 2023  9:23 AM Olam RAMAN wrote: Reason for CRM: Reason for CRM: Christopher Spencer for metformin  for pen and needle cap. advised per notes on chart. Advised not able to have any needles at center [facility] [phone]/ [phone]

## 2023-11-08 NOTE — Telephone Encounter (Signed)
 Copied from CRM #[id number]. Topic: Clinical - Medication Question >> Nov 08, 2023  9:23 AM Olam RAMAN wrote: Reason for CRM: Margarie Platter for metformin  for pen and needle cap. advised per notes on chart. Advised not able to have any needles at center [facility] [phone]/ [phone]

## 2023-11-09 NOTE — Telephone Encounter (Signed)
 Pt has been informed his metformin  was sent in and that we will have to wait until provider returns in order to send in the correct insulin 

## 2023-11-15 ENCOUNTER — Other Ambulatory Visit (HOSPITAL_BASED_OUTPATIENT_CLINIC_OR_DEPARTMENT_OTHER): Payer: Self-pay

## 2023-11-16 ENCOUNTER — Telehealth: Payer: Self-pay | Admitting: Nurse Practitioner

## 2023-11-16 NOTE — Telephone Encounter (Signed)
 Copied from CRM [id number]. Topic: Clinical - Medication Question >> Nov 16, 2023 10:15 AM Terri G wrote: Reason for CRM: Patient wants Dr.Kacy to contact him about his medication warfarin (COUMADIN ) 4 MG tablet and his insulin  pen, he will need a new prescription for it. Callback number [phone]

## 2023-11-17 ENCOUNTER — Telehealth: Payer: Self-pay | Admitting: Licensed Clinical Social Worker

## 2023-11-17 ENCOUNTER — Other Ambulatory Visit: Payer: Self-pay | Admitting: Pharmacist

## 2023-11-17 ENCOUNTER — Other Ambulatory Visit (HOSPITAL_BASED_OUTPATIENT_CLINIC_OR_DEPARTMENT_OTHER): Payer: Self-pay

## 2023-11-17 ENCOUNTER — Other Ambulatory Visit: Payer: Self-pay | Admitting: Nurse Practitioner

## 2023-11-17 DIAGNOSIS — Z952 Presence of prosthetic heart valve: Secondary | ICD-10-CM

## 2023-11-17 DIAGNOSIS — Z7901 Long term (current) use of anticoagulants: Secondary | ICD-10-CM

## 2023-11-17 DIAGNOSIS — E119 Type 2 diabetes mellitus without complications: Secondary | ICD-10-CM

## 2023-11-17 MED ORDER — PEN NEEDLES 30G X 8 MM MISC
3 refills | Status: AC
  Filled 2023-11-17: qty 100, 30d supply, fill #0
  Filled 2023-12-08: qty 100, 90d supply, fill #0

## 2023-11-17 MED ORDER — LANTUS SOLOSTAR 100 UNIT/ML ~~LOC~~ SOPN
10.0000 [IU] | PEN_INJECTOR | Freq: Every day | SUBCUTANEOUS | 2 refills | Status: DC
  Filled 2023-11-17 – 2023-12-08 (×2): qty 9, 90d supply, fill #0

## 2023-11-17 MED ORDER — WARFARIN SODIUM 4 MG PO TABS
ORAL_TABLET | ORAL | 0 refills | Status: DC
Start: 2023-11-17 — End: 2023-12-10
  Filled 2023-11-17 – 2023-11-18 (×2): qty 40, 30d supply, fill #0

## 2023-11-17 NOTE — Telephone Encounter (Signed)
 H&V Care Navigation CSW Progress Note  Clinical Social Worker received a call with questions about getting refill for wafarin . I noted no refills on file for pharmacy. Sent chat to Rosita, PharmD, who assisted with sending in refill. Pt contacted at [phone] and updated that refill sent in by clinical staff- recommended calling pharmacy for refill directly as needed. Reminded pt of appt on 11/6 and if any issues putting medications on AR account to have them call me directly to speak with pharmacy staff.   Patient is participating in a Managed Medicaid Plan:  Yes  SDOH Screenings   Food Insecurity: No Food Insecurity (06/20/2023)  Housing: High Risk (06/20/2023)  Transportation Needs: No Transportation Needs (06/20/2023)  Utilities: Not At Risk (06/20/2023)  Depression (PHQ2-9): High Risk (10/21/2023)  Financial Resource Strain: High Risk (12/04/2017)  Physical Activity: Sufficiently Active (12/04/2017)  Social Connections: Unknown (12/04/2017)  Stress: Stress Concern Present (12/04/2017)  Tobacco Use: High Risk (10/21/2023)    Marit Lark, MSW, LCSW Clinical Social Worker II [facility] Health Heart/Vascular Care Navigation  [phone]- work cell phone (preferred)

## 2023-11-17 NOTE — Telephone Encounter (Signed)
Pt informed

## 2023-11-18 ENCOUNTER — Other Ambulatory Visit (HOSPITAL_BASED_OUTPATIENT_CLINIC_OR_DEPARTMENT_OTHER): Payer: Self-pay

## 2023-11-18 ENCOUNTER — Other Ambulatory Visit: Payer: Self-pay

## 2023-11-19 ENCOUNTER — Other Ambulatory Visit (HOSPITAL_BASED_OUTPATIENT_CLINIC_OR_DEPARTMENT_OTHER): Payer: Self-pay

## 2023-11-22 ENCOUNTER — Other Ambulatory Visit (HOSPITAL_BASED_OUTPATIENT_CLINIC_OR_DEPARTMENT_OTHER): Payer: Self-pay

## 2023-11-23 ENCOUNTER — Other Ambulatory Visit (HOSPITAL_BASED_OUTPATIENT_CLINIC_OR_DEPARTMENT_OTHER): Payer: Self-pay

## 2023-11-25 ENCOUNTER — Ambulatory Visit

## 2023-11-29 ENCOUNTER — Other Ambulatory Visit (HOSPITAL_BASED_OUTPATIENT_CLINIC_OR_DEPARTMENT_OTHER): Payer: Self-pay

## 2023-11-30 ENCOUNTER — Telehealth: Payer: Self-pay | Admitting: Licensed Clinical Social Worker

## 2023-11-30 ENCOUNTER — Ambulatory Visit: Attending: Cardiovascular Disease | Admitting: *Deleted

## 2023-11-30 DIAGNOSIS — Z5181 Encounter for therapeutic drug level monitoring: Secondary | ICD-10-CM | POA: Diagnosis not present

## 2023-11-30 DIAGNOSIS — Z952 Presence of prosthetic heart valve: Secondary | ICD-10-CM | POA: Insufficient documentation

## 2023-11-30 DIAGNOSIS — Z7901 Long term (current) use of anticoagulants: Secondary | ICD-10-CM | POA: Insufficient documentation

## 2023-11-30 DIAGNOSIS — Z7689 Persons encountering health services in other specified circumstances: Secondary | ICD-10-CM | POA: Diagnosis not present

## 2023-11-30 LAB — POCT INR: INR: 1.6 — AB (ref 2.0–3.0)

## 2023-11-30 NOTE — Patient Instructions (Addendum)
 Description   Bill 00788;  INR-1.6; Today take 10mg  (2.5 tablets) then START taking warfarin 6mg  daily except 8mg  Sundays, Tuesdays and Thursdays. Recheck INR in 2 weeks. Coumadin  clinic: [phone]

## 2023-11-30 NOTE — Progress Notes (Signed)
 Description   Bill 00788;  INR-1.6; Today take 10mg  (2.5 tablets) then START taking warfarin 6mg  daily except 8mg  Sundays, Tuesdays and Thursdays. Recheck INR in 2 weeks. Coumadin  clinic: [phone]

## 2023-11-30 NOTE — Telephone Encounter (Signed)
 H&V Care Navigation CSW Progress Note  Clinical Social Worker met with patient to f/u on ride home. Assisted with calling [facility] and arranged ride home from will call. Pt requesting assistance with scheduling upcoming ride. I will assist with scheduling ride to clinic and will ask my colleague to assist with ride home as I will be out of clinic. Pt has ride benefits at this time so unable to schedule cab rides but can if benefits do not work.   Patient is participating in a Managed Medicaid Plan:  Yes-wellcare  SDOH Screenings   Food Insecurity: No Food Insecurity (06/20/2023)  Housing: High Risk (06/20/2023)  Transportation Needs: No Transportation Needs (06/20/2023)  Utilities: Not At Risk (06/20/2023)  Depression (PHQ2-9): High Risk (10/21/2023)  Financial Resource Strain: High Risk (12/04/2017)  Physical Activity: Sufficiently Active (12/04/2017)  Social Connections: Unknown (12/04/2017)  Stress: Stress Concern Present (12/04/2017)  Tobacco Use: High Risk (10/21/2023)    Marit Lark, MSW, LCSW Clinical Social Worker II [facility] Health Heart/Vascular Care Navigation  [phone]- work cell phone (preferred)

## 2023-12-06 ENCOUNTER — Other Ambulatory Visit (HOSPITAL_BASED_OUTPATIENT_CLINIC_OR_DEPARTMENT_OTHER): Payer: Self-pay

## 2023-12-08 ENCOUNTER — Other Ambulatory Visit (HOSPITAL_BASED_OUTPATIENT_CLINIC_OR_DEPARTMENT_OTHER): Payer: Self-pay

## 2023-12-09 ENCOUNTER — Telehealth: Payer: Self-pay | Admitting: Licensed Clinical Social Worker

## 2023-12-09 NOTE — Telephone Encounter (Signed)
 H&V Care Navigation CSW Progress Note  Clinical Social Worker contacted [employer] and set up a ride to take pt to coumadin  next week, will f/u with colleague regarding will call return ride assistance since pt does not have a phone to use to call.  Patient is participating in a Managed Medicaid Plan:  Yes- wellcare  SDOH Screenings   Food Insecurity: No Food Insecurity (06/20/2023)  Housing: High Risk (06/20/2023)  Transportation Needs: No Transportation Needs (06/20/2023)  Utilities: Not At Risk (06/20/2023)  Depression (PHQ2-9): High Risk (10/21/2023)  Financial Resource Strain: High Risk (12/04/2017)  Physical Activity: Sufficiently Active (12/04/2017)  Social Connections: Unknown (12/04/2017)  Stress: Stress Concern Present (12/04/2017)  Tobacco Use: High Risk (10/21/2023)     Marit Lark, MSW, LCSW Clinical Social Worker II [facility] Health Heart/Vascular Care Navigation  [phone]- work cell phone (preferred)

## 2023-12-10 ENCOUNTER — Other Ambulatory Visit (HOSPITAL_BASED_OUTPATIENT_CLINIC_OR_DEPARTMENT_OTHER): Payer: Self-pay

## 2023-12-10 ENCOUNTER — Other Ambulatory Visit: Payer: Self-pay | Admitting: Cardiovascular Disease

## 2023-12-10 ENCOUNTER — Telehealth: Payer: Self-pay | Admitting: Licensed Clinical Social Worker

## 2023-12-10 DIAGNOSIS — Z952 Presence of prosthetic heart valve: Secondary | ICD-10-CM

## 2023-12-10 DIAGNOSIS — Z7901 Long term (current) use of anticoagulants: Secondary | ICD-10-CM

## 2023-12-10 MED ORDER — WARFARIN SODIUM 4 MG PO TABS
ORAL_TABLET | ORAL | 1 refills | Status: DC
  Filled 2023-12-10: qty 55, 31d supply, fill #0
  Filled 2024-01-07: qty 55, 31d supply, fill #1

## 2023-12-10 NOTE — Telephone Encounter (Signed)
 Warfarin 4mg  refill S/p AVR Last INR 11/30/23 Last OV 03/22/23

## 2023-12-10 NOTE — Telephone Encounter (Signed)
 H&V Care Navigation CSW Progress Note  Clinical Social Worker contacted patient by phone to f/u with him regarding ride. Arranged transport through [facility] (pt benefits). Pt aware and aware that Jenna, LCSW, will assist with ride home since I will be out of office. No additional questions today, added details for colleague on appt notes.  Patient is participating in a Managed Medicaid Plan:  Yes-  wellcare  SDOH Screenings   Food Insecurity: No Food Insecurity (06/20/2023)  Housing: High Risk (06/20/2023)  Transportation Needs: No Transportation Needs (06/20/2023)  Utilities: Not At Risk (06/20/2023)  Depression (PHQ2-9): High Risk (10/21/2023)  Financial Resource Strain: High Risk (12/04/2017)  Physical Activity: Sufficiently Active (12/04/2017)  Social Connections: Unknown (12/04/2017)  Stress: Stress Concern Present (12/04/2017)  Tobacco Use: High Risk (10/21/2023)    Marit Lark, MSW, LCSW Clinical Social Worker II [facility] Health Heart/Vascular Care Navigation  [phone]- work cell phone (preferred)

## 2023-12-13 ENCOUNTER — Other Ambulatory Visit (HOSPITAL_BASED_OUTPATIENT_CLINIC_OR_DEPARTMENT_OTHER): Payer: Self-pay

## 2023-12-14 ENCOUNTER — Ambulatory Visit: Attending: Cardiovascular Disease | Admitting: *Deleted

## 2023-12-14 DIAGNOSIS — Z5181 Encounter for therapeutic drug level monitoring: Secondary | ICD-10-CM | POA: Diagnosis not present

## 2023-12-14 DIAGNOSIS — Z952 Presence of prosthetic heart valve: Secondary | ICD-10-CM | POA: Diagnosis not present

## 2023-12-14 DIAGNOSIS — Z7689 Persons encountering health services in other specified circumstances: Secondary | ICD-10-CM | POA: Diagnosis not present

## 2023-12-14 LAB — POCT INR: POC INR: 1.7

## 2023-12-14 NOTE — Patient Instructions (Addendum)
 Description   Bill 00788;  INR-1.7; Today take 10mg  (2.5 tablets) then START taking warfarin 8 mg daily Recheck INR in 2 weeks. Coumadin  clinic: [phone]

## 2023-12-14 NOTE — Progress Notes (Signed)
 Lab Results  Component Value Date   INR 1.7 12/14/2023   INR 1.6 (A) 11/30/2023   INR 2.4 10/28/2023    Description   Bill 00788;  INR-1.7; Today take 10mg  (2.5 tablets) then START taking warfarin 8 mg daily Recheck INR in 2 weeks. Coumadin  clinic: [phone]

## 2023-12-17 ENCOUNTER — Other Ambulatory Visit (HOSPITAL_BASED_OUTPATIENT_CLINIC_OR_DEPARTMENT_OTHER): Payer: Self-pay

## 2023-12-22 ENCOUNTER — Telehealth: Payer: Self-pay | Admitting: Licensed Clinical Social Worker

## 2023-12-22 NOTE — Telephone Encounter (Signed)
 H&V Care Navigation CSW Progress Note  Clinical Social Worker contacted patient by phone to f/u on scheduled ride for appt on 12/9. Reached pt at [phone]- pt updated with ride schedule. We will call for return ride when appt completed.  Patient is participating in a Managed Medicaid Plan:  Yes  SDOH Screenings   Food Insecurity: No Food Insecurity (06/20/2023)  Housing: High Risk (06/20/2023)  Transportation Needs: No Transportation Needs (06/20/2023)  Utilities: Not At Risk (06/20/2023)  Depression (PHQ2-9): High Risk (10/21/2023)  Financial Resource Strain: High Risk (12/04/2017)  Physical Activity: Sufficiently Active (12/04/2017)  Social Connections: Unknown (12/04/2017)  Stress: Stress Concern Present (12/04/2017)  Tobacco Use: High Risk (10/21/2023)    Marit Lark, MSW, LCSW Clinical Social Worker II [facility] Health Heart/Vascular Care Navigation  [phone]- work cell phone (preferred)

## 2023-12-28 ENCOUNTER — Telehealth: Payer: Self-pay | Admitting: Licensed Clinical Social Worker

## 2023-12-28 ENCOUNTER — Ambulatory Visit

## 2023-12-28 NOTE — Telephone Encounter (Signed)
 H&V Care Navigation CSW Progress Note  Clinical Social Worker received call from pt to share that he wasn't picked up for ride. Provided him number for [facility] who shared they had called preferred number three times- this is the directors number who currently isnt at home. LCSW had pharmacy team assist with re-scheduling pt to Monday and I called to re-schedule him for ride through [facility]. Pt aware to be near director about at the appt time Monday. Will f/u Monday to assist with ride home.  Patient is participating in a Managed Medicaid Plan:  Yes-Wellcare  SDOH Screenings   Food Insecurity: No Food Insecurity (06/20/2023)  Housing: High Risk (06/20/2023)  Transportation Needs: No Transportation Needs (06/20/2023)  Utilities: Not At Risk (06/20/2023)  Depression (PHQ2-9): High Risk (10/21/2023)  Financial Resource Strain: High Risk (12/04/2017)  Physical Activity: Sufficiently Active (12/04/2017)  Social Connections: Unknown (12/04/2017)  Stress: Stress Concern Present (12/04/2017)  Tobacco Use: High Risk (10/21/2023)    Marit Lark, MSW, LCSW Clinical Social Worker II [facility] Health Heart/Vascular Care Navigation  [phone]- work cell phone (preferred)

## 2024-01-03 ENCOUNTER — Other Ambulatory Visit (HOSPITAL_BASED_OUTPATIENT_CLINIC_OR_DEPARTMENT_OTHER): Payer: Self-pay

## 2024-01-03 ENCOUNTER — Ambulatory Visit: Attending: Cardiovascular Disease

## 2024-01-03 DIAGNOSIS — Z7901 Long term (current) use of anticoagulants: Secondary | ICD-10-CM | POA: Diagnosis not present

## 2024-01-03 DIAGNOSIS — Z952 Presence of prosthetic heart valve: Secondary | ICD-10-CM

## 2024-01-03 DIAGNOSIS — Z5181 Encounter for therapeutic drug level monitoring: Secondary | ICD-10-CM | POA: Insufficient documentation

## 2024-01-03 LAB — POCT INR: INR: 2.6 (ref 2.0–3.0)

## 2024-01-03 NOTE — Patient Instructions (Signed)
 Description   Bill 00788;  INR-2.6; Today take 4mg  (1 tablet) then Continue taking warfarin 8 mg daily Recheck INR in 3 weeks. Coumadin  clinic: [phone]

## 2024-01-03 NOTE — Progress Notes (Signed)
 Description   Bill 00788;  INR-2.6; Today take 4mg  (1 tablet) then Continue taking warfarin 8 mg daily Recheck INR in 3 weeks. Coumadin  clinic: [phone]

## 2024-01-04 ENCOUNTER — Other Ambulatory Visit (HOSPITAL_BASED_OUTPATIENT_CLINIC_OR_DEPARTMENT_OTHER): Payer: Self-pay

## 2024-01-07 ENCOUNTER — Other Ambulatory Visit (HOSPITAL_BASED_OUTPATIENT_CLINIC_OR_DEPARTMENT_OTHER): Payer: Self-pay

## 2024-01-10 ENCOUNTER — Other Ambulatory Visit (HOSPITAL_BASED_OUTPATIENT_CLINIC_OR_DEPARTMENT_OTHER): Payer: Self-pay

## 2024-01-18 ENCOUNTER — Telehealth (HOSPITAL_BASED_OUTPATIENT_CLINIC_OR_DEPARTMENT_OTHER): Payer: Self-pay | Admitting: Licensed Clinical Social Worker

## 2024-01-18 NOTE — Telephone Encounter (Signed)
 H&V Care Navigation CSW Progress Note  Clinical Social Worker received a call from pt to ask if I could assist with scheduling rides again for appt next week. Encouraged pt to contact [employer] Medicaid to practice scheduling those rides. Happy to assist with return rides due to no cell phone, but pt should attempt contacting them to schedule coumadin  and PCP rides. Pt states understanding- encouraged him to call if any issues.  Patient is participating in a Managed Medicaid Plan:  Yes-wellcare  SDOH Screenings   Food Insecurity: No Food Insecurity (06/20/2023)  Housing: High Risk (06/20/2023)  Transportation Needs: No Transportation Needs (06/20/2023)  Utilities: Not At Risk (06/20/2023)  Depression (PHQ2-9): High Risk (10/21/2023)  Tobacco Use: High Risk (10/21/2023)    Marit Lark, MSW, LCSW Clinical Social Worker II [facility] Health Heart/Vascular Care Navigation  [phone]- work cell phone (preferred)

## 2024-01-24 ENCOUNTER — Ambulatory Visit: Admitting: Pharmacist

## 2024-01-24 DIAGNOSIS — Z5181 Encounter for therapeutic drug level monitoring: Secondary | ICD-10-CM | POA: Diagnosis present

## 2024-01-24 DIAGNOSIS — Z952 Presence of prosthetic heart valve: Secondary | ICD-10-CM | POA: Insufficient documentation

## 2024-01-24 DIAGNOSIS — Z7901 Long term (current) use of anticoagulants: Secondary | ICD-10-CM | POA: Diagnosis present

## 2024-01-24 LAB — POCT INR: INR: 2.5 (ref 2.0–3.0)

## 2024-01-24 NOTE — Patient Instructions (Signed)
 Description   Bill 00788;  INR-2.5; Continue taking warfarin 8 mg daily except for 4mg  on Mondays. Recheck INR in 4 weeks. Coumadin  clinic: [phone]

## 2024-01-24 NOTE — Progress Notes (Signed)
 Description   Bill 00788;  INR-2.5; Continue taking warfarin 8 mg daily except for 4mg  on Mondays. Recheck INR in 4 weeks. Coumadin  clinic: [phone]

## 2024-01-25 ENCOUNTER — Ambulatory Visit (INDEPENDENT_AMBULATORY_CARE_PROVIDER_SITE_OTHER): Admitting: Nurse Practitioner

## 2024-01-25 ENCOUNTER — Encounter: Payer: Self-pay | Admitting: Nurse Practitioner

## 2024-01-25 ENCOUNTER — Other Ambulatory Visit (HOSPITAL_BASED_OUTPATIENT_CLINIC_OR_DEPARTMENT_OTHER): Payer: Self-pay

## 2024-01-25 VITALS — BP 130/78 | HR 70 | Temp 97.8°F | Ht 69.0 in | Wt 221.8 lb

## 2024-01-25 DIAGNOSIS — F1910 Other psychoactive substance abuse, uncomplicated: Secondary | ICD-10-CM

## 2024-01-25 DIAGNOSIS — I10 Essential (primary) hypertension: Secondary | ICD-10-CM | POA: Diagnosis not present

## 2024-01-25 DIAGNOSIS — E119 Type 2 diabetes mellitus without complications: Secondary | ICD-10-CM | POA: Diagnosis not present

## 2024-01-25 DIAGNOSIS — Z794 Long term (current) use of insulin: Secondary | ICD-10-CM

## 2024-01-25 LAB — POCT GLYCOSYLATED HEMOGLOBIN (HGB A1C)
HbA1c POC (<> result, manual entry): 9.1 %
HbA1c, POC (controlled diabetic range): 9.1 % — AB (ref 0.0–7.0)
HbA1c, POC (prediabetic range): 9.1 % — AB (ref 5.7–6.4)
Hemoglobin A1C: 9.1 % — AB (ref 4.0–5.6)

## 2024-01-25 MED ORDER — LANTUS SOLOSTAR 100 UNIT/ML ~~LOC~~ SOPN
14.0000 [IU] | PEN_INJECTOR | Freq: Every day | SUBCUTANEOUS | 2 refills | Status: AC
  Filled 2024-01-25: qty 15, 107d supply, fill #0
  Filled 2024-02-01: qty 12, 85d supply, fill #0

## 2024-01-25 NOTE — Assessment & Plan Note (Signed)
Currently in rehab facility.

## 2024-01-25 NOTE — Progress Notes (Signed)
 " Christopher Glance, NP-C Phone: [phone]  Christopher Spencer is a 59 y.o. male who presents today for follow up.   Discussed the use of AI scribe software for clinical note transcription with the patient, who gave verbal consent to proceed.  History of Present Illness   Christopher Spencer is a 59 year old male with diabetes and hypertension who presents for follow-up of his blood sugar and blood pressure management.  He has a history of diabetes, with his A1c increasing from 7% to 10% three months ago. He was started on Lantus  insulin , administered once daily in the morning. Since starting Lantus , his morning blood sugar levels have been around 130-133 mg/dL. No episodes of hypoglycemia have occurred. He continues to take metformin  twice daily.  He is managing hypertension with losartan  and carvedilol , taken daily. He does not regularly check his blood pressure at home. His blood pressure was previously recorded at 154/90 mmHg. He experiences excessive urination and nocturia, which have improved slightly with Flomax , now occurring about twice per night. He reported being out of Flomax  for two days.  No chest pain, shortness of breath, dizziness, or swelling.      Tobacco Use History[1]  Medications Ordered Prior to Encounter[2]   ROS see history of present illness  Objective  Physical Exam Vitals:   01/25/24 1025 01/25/24 1049  BP: (!) 154/90 130/78  Pulse: 70   Temp: 97.8 F (36.6 C)   SpO2: 97%     BP Readings from Last 3 Encounters:  01/25/24 130/78  10/21/23 138/76  06/23/23 (!) 140/77   Wt Readings from Last 3 Encounters:  01/25/24 221 lb 12.8 oz (100.6 kg)  10/21/23 209 lb 3.2 oz (94.9 kg)  06/20/23 197 lb 8 oz (89.6 kg)    Physical Exam Constitutional:      General: He is not in acute distress.    Appearance: Normal appearance.  HENT:     Head: Normocephalic.  Cardiovascular:     Rate and Rhythm: Normal rate and regular rhythm.     Heart sounds: Normal heart  sounds.  Pulmonary:     Effort: Pulmonary effort is normal.     Breath sounds: Normal breath sounds.  Skin:    General: Skin is warm and dry.  Neurological:     General: No focal deficit present.     Mental Status: He is alert.  Psychiatric:        Mood and Affect: Mood normal.        Behavior: Behavior normal.      Assessment/Plan: Please see individual problem list.  Type 2 diabetes mellitus without complication, with long-term current use of insulin  (HCC) Assessment & Plan: A1c improved from 10.2 to 9.1 but remains above target. Morning blood sugars are stable with no hypoglycemia. Continue metformin  twice daily. Increased Lantus  to 14 units daily and emphasized dietary modifications to further reduce A1c. Scheduled follow-up in 3 months.   Orders: -     POCT glycosylated hemoglobin (Hb A1C) -     Lantus  SoloStar; Inject 14 Units into the skin daily.  Dispense: 15 mL; Refill: 2  Essential hypertension Assessment & Plan: Blood pressure improved to 130/78 mmHg on current regimen. Continue losartan  and carvedilol . Follow up with cardiology on February 2nd.    Polysubstance abuse (HCC) Assessment & Plan: Currently in rehab facility.      Return in about 3 months (around 04/24/2024) for Follow up.   Christopher Glance, NP-C [facility]     [1]  Social History Tobacco Use  Smoking Status Every Day   Current packs/day: 0.50   Average packs/day: 0.5 packs/day for 10.0 years (5.0 ttl pk-yrs)   Types: Cigarettes  Smokeless Tobacco Never  [2]  Current Outpatient Medications on File Prior to Visit  Medication Sig Dispense Refill   Accu-Chek Softclix Lancets lancets Use as directed once daily 100 each 3   atorvastatin  (LIPITOR ) 80 MG tablet Take 1 tablet (80 mg total) by mouth daily. 90 tablet 3   blood glucose meter kit and supplies KIT Use as directed 1 each 0   carvedilol  (COREG ) 25 MG tablet Take 1 tablet (25 mg total) by mouth 2 (two) times  daily with a meal. 180 tablet 3   Insulin  Pen Needle (PEN NEEDLES) 30G X 8 MM MISC Use as directed daily to inject insulin . 100 each 3   losartan  (COZAAR ) 100 MG tablet Take 1 tablet (100 mg total) by mouth daily. 90 tablet 3   metFORMIN  (GLUCOPHAGE ) 500 MG tablet Take 1 tablet (500 mg total) by mouth 2 (two) times daily with a meal. 60 tablet 2   tamsulosin  (FLOMAX ) 0.4 MG CAPS capsule Take 1 capsule (0.4 mg total) by mouth daily. 90 capsule 3   warfarin (COUMADIN ) 4 MG tablet Take 6 mg (1 and 1/2 tablets) daily by mouth except 8 mg (2 tablets) Sundays, Tuesdays and Thursdays or as directed by Anticoagulation Clinic. 55 tablet 1   No current facility-administered medications on file prior to visit.   "

## 2024-01-25 NOTE — Assessment & Plan Note (Signed)
 A1c improved from 10.2 to 9.1 but remains above target. Morning blood sugars are stable with no hypoglycemia. Continue metformin  twice daily. Increased Lantus  to 14 units daily and emphasized dietary modifications to further reduce A1c. Scheduled follow-up in 3 months.

## 2024-01-25 NOTE — Assessment & Plan Note (Signed)
 Blood pressure improved to 130/78 mmHg on current regimen. Continue losartan  and carvedilol . Follow up with cardiology on February 2nd.

## 2024-01-27 ENCOUNTER — Other Ambulatory Visit (HOSPITAL_BASED_OUTPATIENT_CLINIC_OR_DEPARTMENT_OTHER): Payer: Self-pay

## 2024-02-01 ENCOUNTER — Other Ambulatory Visit: Payer: Self-pay | Admitting: Nurse Practitioner

## 2024-02-01 ENCOUNTER — Other Ambulatory Visit (HOSPITAL_BASED_OUTPATIENT_CLINIC_OR_DEPARTMENT_OTHER): Payer: Self-pay

## 2024-02-01 DIAGNOSIS — E1165 Type 2 diabetes mellitus with hyperglycemia: Secondary | ICD-10-CM

## 2024-02-02 ENCOUNTER — Other Ambulatory Visit (HOSPITAL_BASED_OUTPATIENT_CLINIC_OR_DEPARTMENT_OTHER): Payer: Self-pay

## 2024-02-02 ENCOUNTER — Other Ambulatory Visit: Payer: Self-pay | Admitting: Nurse Practitioner

## 2024-02-02 DIAGNOSIS — Z952 Presence of prosthetic heart valve: Secondary | ICD-10-CM

## 2024-02-02 DIAGNOSIS — Z7901 Long term (current) use of anticoagulants: Secondary | ICD-10-CM

## 2024-02-02 MED ORDER — METFORMIN HCL 500 MG PO TABS
500.0000 mg | ORAL_TABLET | Freq: Two times a day (BID) | ORAL | 2 refills | Status: AC
  Filled 2024-02-02: qty 60, 30d supply, fill #0

## 2024-02-04 ENCOUNTER — Other Ambulatory Visit: Payer: Self-pay | Admitting: Nurse Practitioner

## 2024-02-04 ENCOUNTER — Other Ambulatory Visit (HOSPITAL_BASED_OUTPATIENT_CLINIC_OR_DEPARTMENT_OTHER): Payer: Self-pay

## 2024-02-04 DIAGNOSIS — Z952 Presence of prosthetic heart valve: Secondary | ICD-10-CM

## 2024-02-04 DIAGNOSIS — Z7901 Long term (current) use of anticoagulants: Secondary | ICD-10-CM

## 2024-02-07 ENCOUNTER — Other Ambulatory Visit (HOSPITAL_BASED_OUTPATIENT_CLINIC_OR_DEPARTMENT_OTHER): Payer: Self-pay

## 2024-02-07 ENCOUNTER — Other Ambulatory Visit: Payer: Self-pay

## 2024-02-07 ENCOUNTER — Telehealth: Payer: Self-pay | Admitting: Cardiovascular Disease

## 2024-02-07 NOTE — Telephone Encounter (Signed)
" °*  STAT* If patient is at the pharmacy, call can be transferred to refill team.   1. Which medications need to be refilled? (please list name of each medication and dose if known) warfarin (COUMADIN ) 4 MG tablet    2. Would you like to learn more about the convenience, safety, & potential cost savings by using the [facility]? No   3. Are you open to using the [facility] (Type [facility]. ). No    4. Which pharmacy/location (including street and city if local pharmacy) is medication to be sent to?[facility] - [facility]    5. Do they need a 30 day or 90 day supply? 90   Pt states has 3 left & takes 2 a day  "

## 2024-02-08 ENCOUNTER — Other Ambulatory Visit: Payer: Self-pay | Admitting: Cardiovascular Disease

## 2024-02-08 ENCOUNTER — Other Ambulatory Visit: Payer: Self-pay

## 2024-02-08 ENCOUNTER — Other Ambulatory Visit (HOSPITAL_BASED_OUTPATIENT_CLINIC_OR_DEPARTMENT_OTHER): Payer: Self-pay

## 2024-02-08 DIAGNOSIS — Z7901 Long term (current) use of anticoagulants: Secondary | ICD-10-CM

## 2024-02-08 DIAGNOSIS — Z952 Presence of prosthetic heart valve: Secondary | ICD-10-CM

## 2024-02-08 MED ORDER — WARFARIN SODIUM 4 MG PO TABS
ORAL_TABLET | ORAL | 2 refills | Status: AC
  Filled 2024-02-08: qty 55, 30d supply, fill #0

## 2024-02-08 NOTE — Telephone Encounter (Signed)
 Pt is calling saying he is out of medication

## 2024-02-08 NOTE — Telephone Encounter (Signed)
 Warfarin 4mg  Dx-s/p AVR Last INR Check-01/24/24 Last OV- 03/22/23 and OV note states f/u in 6 months, placed a note on next Anticoag Appt

## 2024-02-09 ENCOUNTER — Other Ambulatory Visit (HOSPITAL_BASED_OUTPATIENT_CLINIC_OR_DEPARTMENT_OTHER): Payer: Self-pay

## 2024-02-10 NOTE — Telephone Encounter (Addendum)
 Prescription refill request received for warfarin Lov:  03/22/2023 Next INR check: 02/21/2024 Warfarin tablet strength: 4 mg   Refill was sent in on 02/08/2024

## 2024-02-15 ENCOUNTER — Telehealth: Payer: Self-pay | Admitting: Licensed Clinical Social Worker

## 2024-02-15 NOTE — Telephone Encounter (Signed)
 H&V Care Navigation CSW Progress Note  Clinical Social Worker contacted patient by phone to f/u on upcoming appt transportation. Reached him on facility phone at [phone]. He took down appt details, feels okay calling for ride to appt- we will assist with calling ride home when ready. No additional questions at this time.  Patient is participating in a Managed Medicaid Plan:  Yes- wellcare  SDOH Screenings   Food Insecurity: No Food Insecurity (06/20/2023)  Housing: High Risk (06/20/2023)  Transportation Needs: No Transportation Needs (06/20/2023)  Utilities: Not At Risk (06/20/2023)  Depression (PHQ2-9): Low Risk (01/25/2024)  Tobacco Use: High Risk (01/25/2024)    Christopher Spencer, MSW, LCSW Clinical Social Worker II [facility] Health Heart/Vascular Care Navigation  [phone]- work cell phone (preferred)

## 2024-02-21 ENCOUNTER — Ambulatory Visit: Admitting: *Deleted

## 2024-02-21 DIAGNOSIS — Z7901 Long term (current) use of anticoagulants: Secondary | ICD-10-CM

## 2024-02-21 DIAGNOSIS — Z5181 Encounter for therapeutic drug level monitoring: Secondary | ICD-10-CM

## 2024-02-21 DIAGNOSIS — Z952 Presence of prosthetic heart valve: Secondary | ICD-10-CM

## 2024-02-21 LAB — POCT INR: INR: 2.1 (ref 2.0–3.0)

## 2024-02-21 NOTE — Patient Instructions (Signed)
 Description   Bill 00788;  INR-2.1; Continue taking warfarin 8 mg daily except for 4mg  on Mondays. Recheck INR in 4 weeks. Coumadin  clinic: [phone]

## 2024-02-21 NOTE — Progress Notes (Signed)
 Description   Bill 00788;  INR-2.1; Continue taking warfarin 8 mg daily except for 4mg  on Mondays. Recheck INR in 4 weeks. Coumadin  clinic: [phone]

## 2024-03-20 ENCOUNTER — Ambulatory Visit

## 2024-05-01 ENCOUNTER — Ambulatory Visit: Admitting: Physician Assistant
